# Patient Record
Sex: Female | Born: 1985 | Race: White | Hispanic: No | Marital: Married | Smoking: Never smoker
Health system: Southern US, Community
[De-identification: ages and names within clinical notes are randomized; demographics above are authoritative.]

## PROBLEM LIST (undated history)

## (undated) DIAGNOSIS — F419 Anxiety disorder, unspecified: Secondary | ICD-10-CM

## (undated) DIAGNOSIS — R569 Unspecified convulsions: Secondary | ICD-10-CM

## (undated) DIAGNOSIS — M545 Low back pain, unspecified: Secondary | ICD-10-CM

## (undated) DIAGNOSIS — G4700 Insomnia, unspecified: Secondary | ICD-10-CM

## (undated) DIAGNOSIS — R319 Hematuria, unspecified: Secondary | ICD-10-CM

## (undated) DIAGNOSIS — R21 Rash and other nonspecific skin eruption: Secondary | ICD-10-CM

## (undated) DIAGNOSIS — F410 Panic disorder [episodic paroxysmal anxiety] without agoraphobia: Secondary | ICD-10-CM

## (undated) DIAGNOSIS — Z9189 Other specified personal risk factors, not elsewhere classified: Secondary | ICD-10-CM

## (undated) HISTORY — PX: NASAL ENDOSCOPY: SHX286

## (undated) HISTORY — PX: MOUTH SURGERY: SHX715

## (undated) HISTORY — PX: WISDOM TOOTH EXTRACTION: SHX21

## (undated) HISTORY — PX: MOLE REMOVAL: SHX2046

## (undated) HISTORY — DX: Low back pain, unspecified: M54.50

## (undated) HISTORY — DX: Insomnia, unspecified: G47.00

## (undated) HISTORY — DX: Rash and other nonspecific skin eruption: R21

## (undated) HISTORY — PX: TONSILLECTOMY: SUR1361

## (undated) HISTORY — PX: SHOULDER SURGERY: SHX246

## (undated) HISTORY — PX: COLONOSCOPY: SHX174

## (undated) HISTORY — DX: Hematuria, unspecified: R31.9

## (undated) HISTORY — PX: ROTATOR CUFF REPAIR: SHX139

---
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
## 2006-07-13 ENCOUNTER — Emergency Department (HOSPITAL_COMMUNITY): Admission: EM | Admit: 2006-07-13 | Discharge: 2006-07-13 | Payer: Self-pay | Admitting: *Deleted

## 2009-05-27 ENCOUNTER — Emergency Department (HOSPITAL_BASED_OUTPATIENT_CLINIC_OR_DEPARTMENT_OTHER): Admission: EM | Admit: 2009-05-27 | Discharge: 2009-05-27 | Payer: Self-pay | Admitting: Emergency Medicine

## 2010-01-17 ENCOUNTER — Other Ambulatory Visit: Admission: RE | Admit: 2010-01-17 | Discharge: 2010-01-17 | Payer: Self-pay | Admitting: Family Medicine

## 2010-09-27 ENCOUNTER — Other Ambulatory Visit: Admission: RE | Admit: 2010-09-27 | Discharge: 2010-09-27 | Payer: Self-pay | Admitting: Obstetrics and Gynecology

## 2011-03-18 LAB — DIFFERENTIAL
Basophils Absolute: 0 10*3/uL (ref 0.0–0.1)
Basophils Relative: 0 % (ref 0–1)
Eosinophils Absolute: 0.1 10*3/uL (ref 0.0–0.7)
Eosinophils Relative: 1 % (ref 0–5)
Lymphocytes Relative: 24 % (ref 12–46)
Lymphs Abs: 2 10*3/uL (ref 0.7–4.0)
Monocytes Absolute: 0.6 10*3/uL (ref 0.1–1.0)
Monocytes Relative: 7 % (ref 3–12)
Neutro Abs: 5.7 10*3/uL (ref 1.7–7.7)
Neutrophils Relative %: 68 % (ref 43–77)

## 2011-03-18 LAB — BASIC METABOLIC PANEL
BUN: 14 mg/dL (ref 6–23)
CO2: 27 mEq/L (ref 19–32)
Calcium: 9.3 mg/dL (ref 8.4–10.5)
Chloride: 103 mEq/L (ref 96–112)
Creatinine, Ser: 0.8 mg/dL (ref 0.4–1.2)
GFR calc Af Amer: >60 mL/min (ref >60)
GFR calc non Af Amer: >60 mL/min (ref >60)
Glucose, Bld: 85 mg/dL (ref 70–99)
Potassium: 4.4 mEq/L (ref 3.5–5.1)
Sodium: 141 mEq/L (ref 135–145)

## 2011-03-18 LAB — CBC
HCT: 44.7 % (ref 36.0–46.0)
Hemoglobin: 14.2 g/dL (ref 12.0–15.0)
MCHC: 31.7 g/dL (ref 30.0–36.0)
MCV: 88.8 fL (ref 78.0–100.0)
Platelets: 221 10*3/uL (ref 150–400)
RBC: 5.04 MIL/uL (ref 3.87–5.11)
RDW: 12.3 % (ref 11.5–15.5)
WBC: 8.4 10*3/uL (ref 4.0–10.5)

## 2011-03-18 LAB — PREGNANCY, URINE: Preg Test, Ur: NEGATIVE

## 2011-04-01 ENCOUNTER — Other Ambulatory Visit (HOSPITAL_COMMUNITY)
Admission: RE | Admit: 2011-04-01 | Discharge: 2011-04-01 | Disposition: A | Discharge status: Home / Self Care | Payer: Managed Care, Other (non HMO) | Source: Ambulatory Visit | Attending: Obstetrics and Gynecology | Admitting: Obstetrics and Gynecology

## 2011-04-01 DIAGNOSIS — Z01419 Encounter for gynecological examination (general) (routine) without abnormal findings: Secondary | ICD-10-CM | POA: Insufficient documentation

## 2012-05-26 ENCOUNTER — Other Ambulatory Visit: Payer: Self-pay | Admitting: Obstetrics and Gynecology

## 2012-05-26 ENCOUNTER — Other Ambulatory Visit (HOSPITAL_COMMUNITY)
Admission: RE | Admit: 2012-05-26 | Discharge: 2012-05-26 | Disposition: A | Discharge status: Home / Self Care | Payer: Managed Care, Other (non HMO) | Source: Ambulatory Visit | Attending: Obstetrics and Gynecology | Admitting: Obstetrics and Gynecology

## 2012-05-26 DIAGNOSIS — Z113 Encounter for screening for infections with a predominantly sexual mode of transmission: Secondary | ICD-10-CM | POA: Insufficient documentation

## 2012-05-26 DIAGNOSIS — Z01419 Encounter for gynecological examination (general) (routine) without abnormal findings: Secondary | ICD-10-CM | POA: Insufficient documentation

## 2012-05-26 DIAGNOSIS — N760 Acute vaginitis: Secondary | ICD-10-CM | POA: Insufficient documentation

## 2013-06-28 ENCOUNTER — Other Ambulatory Visit (HOSPITAL_COMMUNITY)
Admission: RE | Admit: 2013-06-28 | Discharge: 2013-06-28 | Disposition: A | Discharge status: Home / Self Care | Payer: 59 | Source: Ambulatory Visit | Attending: Obstetrics and Gynecology | Admitting: Obstetrics and Gynecology

## 2013-06-28 ENCOUNTER — Other Ambulatory Visit: Payer: Self-pay | Admitting: Obstetrics and Gynecology

## 2013-06-28 DIAGNOSIS — Z01419 Encounter for gynecological examination (general) (routine) without abnormal findings: Secondary | ICD-10-CM | POA: Insufficient documentation

## 2014-07-04 ENCOUNTER — Other Ambulatory Visit (HOSPITAL_COMMUNITY)
Admission: RE | Admit: 2014-07-04 | Discharge: 2014-07-04 | Disposition: A | Discharge status: Home / Self Care | Payer: 59 | Source: Ambulatory Visit | Attending: Obstetrics and Gynecology | Admitting: Obstetrics and Gynecology

## 2014-07-04 ENCOUNTER — Other Ambulatory Visit: Payer: Self-pay | Admitting: Obstetrics and Gynecology

## 2014-07-04 DIAGNOSIS — Z01419 Encounter for gynecological examination (general) (routine) without abnormal findings: Secondary | ICD-10-CM | POA: Insufficient documentation

## 2014-07-07 LAB — CYTOLOGY - PAP

## 2014-10-10 ENCOUNTER — Emergency Department (HOSPITAL_COMMUNITY): Payer: 59

## 2014-10-10 ENCOUNTER — Emergency Department (HOSPITAL_COMMUNITY)
Admission: EM | Admit: 2014-10-10 | Discharge: 2014-10-11 | Disposition: A | Discharge status: Home / Self Care | Payer: 59 | Attending: Emergency Medicine | Admitting: Emergency Medicine

## 2014-10-10 ENCOUNTER — Encounter (HOSPITAL_COMMUNITY): Payer: Self-pay

## 2014-10-10 DIAGNOSIS — Z3202 Encounter for pregnancy test, result negative: Secondary | ICD-10-CM | POA: Insufficient documentation

## 2014-10-10 DIAGNOSIS — Z79899 Other long term (current) drug therapy: Secondary | ICD-10-CM | POA: Insufficient documentation

## 2014-10-10 DIAGNOSIS — N83209 Unspecified ovarian cyst, unspecified side: Secondary | ICD-10-CM

## 2014-10-10 DIAGNOSIS — N830 Follicular cyst of ovary: Secondary | ICD-10-CM | POA: Insufficient documentation

## 2014-10-10 DIAGNOSIS — Z7951 Long term (current) use of inhaled steroids: Secondary | ICD-10-CM | POA: Diagnosis not present

## 2014-10-10 DIAGNOSIS — R102 Pelvic and perineal pain: Secondary | ICD-10-CM | POA: Diagnosis not present

## 2014-10-10 DIAGNOSIS — R1032 Left lower quadrant pain: Secondary | ICD-10-CM | POA: Diagnosis present

## 2014-10-10 LAB — CBC WITH DIFFERENTIAL/PLATELET
Basophils Absolute: 0 10*3/uL (ref 0.0–0.1)
Basophils Relative: 0 % (ref 0–1)
Eosinophils Absolute: 0.2 10*3/uL (ref 0.0–0.7)
Eosinophils Relative: 2 % (ref 0–5)
HCT: 38.2 % (ref 36.0–46.0)
Hemoglobin: 13.1 g/dL (ref 12.0–15.0)
Lymphocytes Relative: 39 % (ref 12–46)
Lymphs Abs: 3.3 10*3/uL (ref 0.7–4.0)
MCH: 29.8 pg (ref 26.0–34.0)
MCHC: 34.3 g/dL (ref 30.0–36.0)
MCV: 87 fL (ref 78.0–100.0)
Monocytes Absolute: 0.7 10*3/uL (ref 0.1–1.0)
Monocytes Relative: 8 % (ref 3–12)
Neutro Abs: 4.4 10*3/uL (ref 1.7–7.7)
Neutrophils Relative %: 51 % (ref 43–77)
Platelets: 201 10*3/uL (ref 150–400)
RBC: 4.39 MIL/uL (ref 3.87–5.11)
RDW: 12.9 % (ref 11.5–15.5)
WBC: 8.6 10*3/uL (ref 4.0–10.5)

## 2014-10-10 LAB — URINE MICROSCOPIC-ADD ON

## 2014-10-10 LAB — COMPREHENSIVE METABOLIC PANEL
ALT: 57 U/L — ABNORMAL HIGH (ref 0–35)
AST: 35 U/L (ref 0–37)
Albumin: 3.9 g/dL (ref 3.5–5.2)
Alkaline Phosphatase: 59 U/L (ref 39–117)
Anion gap: 13 (ref 5–15)
BUN: 13 mg/dL (ref 6–23)
CO2: 26 mEq/L (ref 19–32)
Calcium: 9.5 mg/dL (ref 8.4–10.5)
Chloride: 99 mEq/L (ref 96–112)
Creatinine, Ser: 0.69 mg/dL (ref 0.50–1.10)
GFR calc Af Amer: >90 mL/min (ref >90)
GFR calc non Af Amer: >90 mL/min (ref >90)
Glucose, Bld: 94 mg/dL (ref 70–99)
Potassium: 4.3 mEq/L (ref 3.7–5.3)
Sodium: 138 mEq/L (ref 137–147)
Total Bilirubin: <0.2 mg/dL — ABNORMAL LOW (ref 0.3–1.2)
Total Protein: 8.3 g/dL (ref 6.0–8.3)

## 2014-10-10 LAB — PREGNANCY, URINE: Preg Test, Ur: NEGATIVE

## 2014-10-10 LAB — URINALYSIS, ROUTINE W REFLEX MICROSCOPIC
Bilirubin Urine: NEGATIVE
Glucose, UA: NEGATIVE mg/dL
Ketones, ur: NEGATIVE mg/dL
Nitrite: NEGATIVE
Protein, ur: NEGATIVE mg/dL
Specific Gravity, Urine: 1.018 (ref 1.005–1.030)
Urobilinogen, UA: 0.2 mg/dL (ref 0.0–1.0)
pH: 6.5 (ref 5.0–8.0)

## 2014-10-10 LAB — LIPASE, BLOOD: Lipase: 43 U/L (ref 11–59)

## 2014-10-10 MED ORDER — IOHEXOL 300 MG/ML  SOLN
50.0000 mL | Freq: Once | INTRAMUSCULAR | Status: AC | PRN
  Administered 2014-10-10: 50 mL via ORAL

## 2014-10-10 MED ORDER — IOHEXOL 300 MG/ML  SOLN
100.0000 mL | Freq: Once | INTRAMUSCULAR | Status: AC | PRN
  Administered 2014-10-10: 100 mL via INTRAVENOUS

## 2014-10-10 NOTE — ED Notes (Signed)
Pt c/o lower abdominal pain and distention x 2-3 days.  Pain score 4/10, increasing w/ movement.  Denies n/v/d.  Pt sent by PCP to r/o appendicitis and for a pelvic exam.

## 2014-10-11 ENCOUNTER — Emergency Department (HOSPITAL_COMMUNITY): Payer: 59

## 2014-10-11 MED ORDER — TRAMADOL HCL 50 MG PO TABS: 50.0000 mg | ORAL_TABLET | Freq: Four times a day (QID) | ORAL | Status: DC | PRN

## 2014-10-11 MED ORDER — MORPHINE SULFATE 4 MG/ML IJ SOLN
4.0000 mg | Freq: Once | INTRAMUSCULAR | Status: AC
  Administered 2014-10-11: 4 mg via INTRAVENOUS
  Filled 2014-10-11: qty 1

## 2014-10-11 MED ORDER — ONDANSETRON HCL 4 MG/2ML IJ SOLN
4.0000 mg | Freq: Once | INTRAMUSCULAR | Status: AC
  Administered 2014-10-11: 4 mg via INTRAVENOUS
  Filled 2014-10-11: qty 2

## 2014-10-11 NOTE — Discharge Instructions (Signed)
You have a hemorrhagic cyst. Please see your Gynecologist for evaluation of the cyst. If the bleeding is extremely heavy - come to the ER. Most of the time these cyst stop bleeding on their own.   Ovarian Cyst An ovarian cyst is a fluid-filled sac that forms on an ovary. The ovaries are small organs that produce eggs in women. Various types of cysts can form on the ovaries. Most are not cancerous. Many do not cause problems, and they often go away on their own. Some may cause symptoms and require treatment. Common types of ovarian cysts include:  Functional cysts--These cysts may occur every month during the menstrual cycle. This is normal. The cysts usually go away with the next menstrual cycle if the woman does not get pregnant. Usually, there are no symptoms with a functional cyst.  Endometrioma cysts--These cysts form from the tissue that lines the uterus. They are also called "chocolate cysts" because they become filled with blood that turns brown. This type of cyst can cause pain in the lower abdomen during intercourse and with your menstrual period.  Cystadenoma cysts--This type develops from the cells on the outside of the ovary. These cysts can get very big and cause lower abdomen pain and pain with intercourse. This type of cyst can twist on itself, cut off its blood supply, and cause severe pain. It can also easily rupture and cause a lot of pain.  Dermoid cysts--This type of cyst is sometimes found in both ovaries. These cysts may contain different kinds of body tissue, such as skin, teeth, hair, or cartilage. They usually do not cause symptoms unless they get very big.  Theca lutein cysts--These cysts occur when too much of a certain hormone (human chorionic gonadotropin) is produced and overstimulates the ovaries to produce an egg. This is most common after procedures used to assist with the conception of a baby (in vitro fertilization). CAUSES   Fertility drugs can cause a condition  in which multiple large cysts are formed on the ovaries. This is called ovarian hyperstimulation syndrome.  A condition called polycystic ovary syndrome can cause hormonal imbalances that can lead to nonfunctional ovarian cysts. SIGNS AND SYMPTOMS  Many ovarian cysts do not cause symptoms. If symptoms are present, they may include:  Pelvic pain or pressure.  Pain in the lower abdomen.  Pain during sexual intercourse.  Increasing girth (swelling) of the abdomen.  Abnormal menstrual periods.  Increasing pain with menstrual periods.  Stopping having menstrual periods without being pregnant. DIAGNOSIS  These cysts are commonly found during a routine or annual pelvic exam. Tests may be ordered to find out more about the cyst. These tests may include:  Ultrasound.  X-ray of the pelvis.  CT scan.  MRI.  Blood tests. TREATMENT  Many ovarian cysts go away on their own without treatment. Your health care provider may want to check your cyst regularly for 2-3 months to see if it changes. For women in menopause, it is particularly important to monitor a cyst closely because of the higher rate of ovarian cancer in menopausal women. When treatment is needed, it may include any of the following:  A procedure to drain the cyst (aspiration). This may be done using a long needle and ultrasound. It can also be done through a laparoscopic procedure. This involves using a thin, lighted tube with a tiny camera on the end (laparoscope) inserted through a small incision.  Surgery to remove the whole cyst. This may be done using laparoscopic  surgery or an open surgery involving a larger incision in the lower abdomen.  Hormone treatment or birth control pills. These methods are sometimes used to help dissolve a cyst. HOME CARE INSTRUCTIONS   Only take over-the-counter or prescription medicines as directed by your health care provider.  Follow up with your health care provider as directed.  Get  regular pelvic exams and Pap tests. SEEK MEDICAL CARE IF:   Your periods are late, irregular, or painful, or they stop.  Your pelvic pain or abdominal pain does not go away.  Your abdomen becomes larger or swollen.  You have pressure on your bladder or trouble emptying your bladder completely.  You have pain during sexual intercourse.  You have feelings of fullness, pressure, or discomfort in your stomach.  You lose weight for no apparent reason.  You feel generally ill.  You become constipated.  You lose your appetite.  You develop acne.  You have an increase in body and facial hair.  You are gaining weight, without changing your exercise and eating habits.  You think you are pregnant. SEEK IMMEDIATE MEDICAL CARE IF:   You have increasing abdominal pain.  You feel sick to your stomach (nauseous), and you throw up (vomit).  You develop a fever that comes on suddenly.  You have abdominal pain during a bowel movement.  Your menstrual periods become heavier than usual. MAKE SURE YOU:  Understand these instructions.  Will watch your condition.  Will get help right away if you are not doing well or get worse. Document Released: 11/25/2005 Document Revised: 11/30/2013 Document Reviewed: 08/02/2013 [facility] Patient Information 2015 [place], [place]. This information is not intended to replace advice given to you by your health care provider. Make sure you discuss any questions you have with your health care provider.

## 2014-10-11 NOTE — ED Provider Notes (Signed)
CSN: [id number]     Arrival date & time 10/10/14  1838 History   First MD Initiated Contact with Patient 10/10/14 2144     Chief Complaint  Patient presents with  . Abdominal Pain     (Consider location/radiation/quality/duration/timing/severity/associated sxs/prior Treatment) HPI Patient presents to the emergency department withlower abdominal pain for the last 2-3 days.  Patient states that pain is increased with movement and palpation.  Patient states that she was sent here by her primary care doctor to rule out appendicitis and for further evaluation.  The patient states that nothing seems to make her condition better.  Patient denies chest pain, shortness of breath, nausea, vomiting, weakness, dizziness, headache, blurred vision, back pain, neck pain, dysuria, vaginal bleeding, vaginal discharge, fever or syncope.  The patient states that her pain is mostly in the left lower quadrant History reviewed. No pertinent past medical history. Past Surgical History  Procedure Laterality Date  . Shoulder surgery Right   . Tonsillectomy    . Tonsillectomy     History reviewed. No pertinent family history. History  Substance Use Topics  . Smoking status: Never Smoker   . Smokeless tobacco: Not on file  . Alcohol Use: Yes     Comment: occ   OB History    No data available     Review of Systems All other systems negative except as documented in the HPI. All pertinent positives and negatives as reviewed in the HPI.   Allergies  Review of patient's allergies indicates no known allergies.  Home Medications   Prior to Admission medications   Medication Sig Start Date End Date Taking? Authorizing Provider  ALPRAZolam Prudy Feeler) 0.5 MG tablet Take 0.25-0.5 mg by mouth 2 (two) times daily as needed for anxiety.   Yes Historical Provider, MD  fluticasone (FLONASE) 50 MCG/ACT nasal spray Place 2 sprays into both nostrils daily.   Yes Historical Provider, MD  Misc Natural Products (GLUCOSAMINE  CHONDROITIN ADV PO) Take 2 tablets by mouth 2 (two) times daily.   Yes Historical Provider, MD  Multiple Vitamin (MULTIVITAMIN WITH MINERALS) TABS tablet Take 1 tablet by mouth daily.   Yes Historical Provider, MD  sertraline (ZOLOFT) 50 MG tablet Take 50 mg by mouth daily.   Yes Historical Provider, MD   BP 110/73 mmHg  Pulse 85  Temp(Src) 98.8 F (37.1 C) (Oral)  Resp 18  SpO2 99%  LMP 09/19/2014 (Approximate) Physical Exam  Constitutional: She is oriented to person, place, and time. She appears well-developed and well-nourished. No distress.  HENT:  Head: Normocephalic and atraumatic.  Mouth/Throat: Oropharynx is clear and moist.  Eyes: Pupils are equal, round, and reactive to light.  Neck: Normal range of motion. Neck supple.  Cardiovascular: Normal rate, regular rhythm and normal heart sounds.  Exam reveals no gallop and no friction rub.   No murmur heard. Pulmonary/Chest: Effort normal and breath sounds normal. No respiratory distress.  Abdominal: Soft. Normal appearance and bowel sounds are normal. She exhibits no distension. There is tenderness in the left lower quadrant. There is no rigidity, no rebound and no guarding.    Genitourinary: No vaginal discharge found.  Neurological: She is alert and oriented to person, place, and time. She exhibits normal muscle tone. Coordination normal.  Skin: Skin is warm and dry. No rash noted. No erythema.  Nursing note and vitals reviewed.   ED Course  Procedures (including critical care time) Labs Review Labs Reviewed  COMPREHENSIVE METABOLIC PANEL - Abnormal; Notable for the following:  ALT 57 (*)    Total Bilirubin <0.2 (*)    All other components within normal limits  URINALYSIS, ROUTINE W REFLEX MICROSCOPIC - Abnormal; Notable for the following:    APPearance CLOUDY (*)    Hgb urine dipstick MODERATE (*)    Leukocytes, UA MODERATE (*)    All other components within normal limits  URINE MICROSCOPIC-ADD ON - Abnormal;  Notable for the following:    Squamous Epithelial / LPF FEW (*)    Bacteria, UA MANY (*)    All other components within normal limits  LIPASE, BLOOD  CBC WITH DIFFERENTIAL  PREGNANCY, URINE    Imaging Review Kore[place] Transvaginal Non-ob  10/11/2014   CLINICAL DATA:  Pelvic pain  EXAM: TRANSABDOMINAL AND TRANSVAGINAL ULTRASOUND OF PELVIS  DOPPLER ULTRASOUND OF OVARIES  TECHNIQUE: Both transabdominal and transvaginal ultrasound examinations of the pelvis were performed. Transabdominal technique was performed for global imaging of the pelvis including uterus, ovaries, adnexal regions, and pelvic cul-de-sac.  It was necessary to proceed with endovaginal exam following the transabdominal exam to visualize the endometrium and ovaries. Color and duplex Doppler ultrasound was utilized to evaluate blood flow to the ovaries.  COMPARISON:  CT abdomen and pelvis 10/10/2014  FINDINGS: Uterus  Measurements: 6.3 x 5.5 x 2.9 cm, anteverted. No fibroids or other mass visualized.  Endometrium  Thickness: 10 mm.  No focal abnormality visualized.  Right ovary  Measurements: 2.6 x 1.7 x 1.6 cm. Normal appearance/no adnexal mass.  Left ovary  Measurements: 3.3 x 2.2 x 2.6 cm. Complex cystic structure with homogeneous diffuse low-level echoes demonstrated. Lesion measures 2 x 1.6 x 1.7 cm. There is prominent peripheral flow. Appearance is consistent with corpus luteum cyst.  Pulsed Doppler evaluation of both ovaries demonstrates normal low-resistance arterial and venous waveforms. Flow is demonstrated in both ovaries on color flow Doppler imaging.  Other findings  Moderate free fluid in the pelvis with internal echoes consistent with hemorrhagic fluid.  IMPRESSION: Probable hemorrhagic corpus luteum cyst in the left ovary with free fluid in the pelvis. Uterus and right ovary are unremarkable.   Electronically Signed   By: Burman NievesWilliam  Stevens M.D.   On: 10/11/2014 02:44   [place] Pelvis Complete  10/11/2014   CLINICAL DATA:  Pelvic pain   EXAM: TRANSABDOMINAL AND TRANSVAGINAL ULTRASOUND OF PELVIS  DOPPLER ULTRASOUND OF OVARIES  TECHNIQUE: Both transabdominal and transvaginal ultrasound examinations of the pelvis were performed. Transabdominal technique was performed for global imaging of the pelvis including uterus, ovaries, adnexal regions, and pelvic cul-de-sac.  It was necessary to proceed with endovaginal exam following the transabdominal exam to visualize the endometrium and ovaries. Color and duplex Doppler ultrasound was utilized to evaluate blood flow to the ovaries.  COMPARISON:  CT abdomen and pelvis 10/10/2014  FINDINGS: Uterus  Measurements: 6.3 x 5.5 x 2.9 cm, anteverted. No fibroids or other mass visualized.  Endometrium  Thickness: 10 mm.  No focal abnormality visualized.  Right ovary  Measurements: 2.6 x 1.7 x 1.6 cm. Normal appearance/no adnexal mass.  Left ovary  Measurements: 3.3 x 2.2 x 2.6 cm. Complex cystic structure with homogeneous diffuse low-level echoes demonstrated. Lesion measures 2 x 1.6 x 1.7 cm. There is prominent peripheral flow. Appearance is consistent with corpus luteum cyst.  Pulsed Doppler evaluation of both ovaries demonstrates normal low-resistance arterial and venous waveforms. Flow is demonstrated in both ovaries on color flow Doppler imaging.  Other findings  Moderate free fluid in the pelvis with internal echoes consistent with hemorrhagic fluid.  IMPRESSION: Probable hemorrhagic corpus luteum cyst in the left ovary with free fluid in the pelvis. Uterus and right ovary are unremarkable.   Electronically Signed   By: Burman NievesWilliam  Stevens M.D.   On: 10/11/2014 02:44   Ct Abdomen Pelvis W Contrast  10/11/2014   CLINICAL DATA:  Abdominal pain.  EXAM: CT ABDOMEN AND PELVIS WITH CONTRAST  TECHNIQUE: Multidetector CT imaging of the abdomen and pelvis was performed using the standard protocol following bolus administration of intravenous contrast.  CONTRAST:  50mL OMNIPAQUE IOHEXOL 300 MG/ML SOLN, 100mL OMNIPAQUE  IOHEXOL 300 MG/ML SOLN  COMPARISON:  None.  FINDINGS: The lung bases are clear.  The liver, spleen, gallbladder, pancreas, adrenal glands, kidneys, abdominal aorta, inferior vena cava, and retroperitoneal lymph nodes are unremarkable. Stomach and small bowel are decompressed. Contrast material and stool in the colon without distention or wall thickening. No free air or free fluid in the abdomen.  Pelvis: There is an involuting follicle in the left ovary with moderate amount of free fluid/hemorrhage in the pelvis and extending up the pericolic gutters. This is probably physiologic. Uterus is not enlarged. Appendix is normal. No pelvic mass or lymphadenopathy. No evidence of diverticulitis. The no destructive bone lesions.  IMPRESSION: Probable involuting follicle in the left ovary with hemorrhagic fluid in the pelvis extending up the pericolic gutters, likely due to physiologic process.   Electronically Signed   By: Burman NievesWilliam  Stevens M.D.   On: 10/11/2014 00:37   [place] Art/ven Flow Abd Pelv Doppler  10/11/2014   CLINICAL DATA:  Pelvic pain  EXAM: TRANSABDOMINAL AND TRANSVAGINAL ULTRASOUND OF PELVIS  DOPPLER ULTRASOUND OF OVARIES  TECHNIQUE: Both transabdominal and transvaginal ultrasound examinations of the pelvis were performed. Transabdominal technique was performed for global imaging of the pelvis including uterus, ovaries, adnexal regions, and pelvic cul-de-sac.  It was necessary to proceed with endovaginal exam following the transabdominal exam to visualize the endometrium and ovaries. Color and duplex Doppler ultrasound was utilized to evaluate blood flow to the ovaries.  COMPARISON:  CT abdomen and pelvis 10/10/2014  FINDINGS: Uterus  Measurements: 6.3 x 5.5 x 2.9 cm, anteverted. No fibroids or other mass visualized.  Endometrium  Thickness: 10 mm.  No focal abnormality visualized.  Right ovary  Measurements: 2.6 x 1.7 x 1.6 cm. Normal appearance/no adnexal mass.  Left ovary  Measurements: 3.3 x 2.2 x 2.6 cm.  Complex cystic structure with homogeneous diffuse low-level echoes demonstrated. Lesion measures 2 x 1.6 x 1.7 cm. There is prominent peripheral flow. Appearance is consistent with corpus luteum cyst.  Pulsed Doppler evaluation of both ovaries demonstrates normal low-resistance arterial and venous waveforms. Flow is demonstrated in both ovaries on color flow Doppler imaging.  Other findings  Moderate free fluid in the pelvis with internal echoes consistent with hemorrhagic fluid.  IMPRESSION: Probable hemorrhagic corpus luteum cyst in the left ovary with free fluid in the pelvis. Uterus and right ovary are unremarkable.   Electronically Signed   By: Burman NievesWilliam  Stevens M.D.   On: 10/11/2014 02:44      MDM   Final diagnoses:  Acute pelvic pain, female     Patient was advised that she will need follow-up withher GYN, awaiting ultrasound results. patient is advised of her results and all questions were answered.  She agrees to the plan.  Carlyle Dollyhristopher W Morayma [place], PA-C 10/11/14 [phone]

## 2014-10-11 NOTE — ED Provider Notes (Signed)
  Physical Exam  BP 110/73 mmHg  Pulse 85  Temp(Src) 98.8 F (37.1 C) (Oral)  Resp 18  SpO2 99%  LMP 09/19/2014 (Approximate)  Physical Exam  ED Course  Procedures  MDM  PT comes in with abd pain. US ordered  -to eval for a complex mass, that looks like cyst. Vitals are stable, pt has a gyne.      Derwood KaplanAnkit Ajanae Virag, MD 10/11/14 [id number]

## 2014-11-13 ENCOUNTER — Telehealth: Payer: Self-pay | Admitting: Obstetrics and Gynecology

## 2014-11-13 NOTE — Telephone Encounter (Signed)
TC from pattient of Dr. Pam Drownole--hx hemorrhagic left ovarian cyst, approx 3 cm, dx 11/3 in ER, had pain.  Seen by Dr. Richardson Doppole 1-2 weeks later, with cyst larger (approx 5 cm).  On Vicodin at home. Has been on OCPs and Nuvaring for cyst control, but has switched several times, restarted last week.  Calling today with increased pain, small amount bright red bleeding.  Taking Vicodin without benefit.  Advised patient to be seen in MAU for emergent evaluation, then will need f/u with Dr. Richardson Doppole regarding long-term management of issue.

## 2015-05-04 ENCOUNTER — Other Ambulatory Visit: Payer: Self-pay | Admitting: Orthopedic Surgery

## 2015-05-04 DIAGNOSIS — M25512 Pain in left shoulder: Secondary | ICD-10-CM

## 2015-05-04 DIAGNOSIS — M25511 Pain in right shoulder: Secondary | ICD-10-CM

## 2015-05-05 ENCOUNTER — Other Ambulatory Visit: Payer: Self-pay | Admitting: Orthopedic Surgery

## 2015-05-05 DIAGNOSIS — M25512 Pain in left shoulder: Secondary | ICD-10-CM

## 2015-05-09 ENCOUNTER — Ambulatory Visit
Admission: RE | Admit: 2015-05-09 | Discharge: 2015-05-09 | Disposition: A | Discharge status: Home / Self Care | Payer: 59 | Source: Ambulatory Visit | Attending: Orthopedic Surgery | Admitting: Orthopedic Surgery

## 2015-05-09 DIAGNOSIS — M25511 Pain in right shoulder: Secondary | ICD-10-CM | POA: Insufficient documentation

## 2015-05-09 DIAGNOSIS — Z9889 Other specified postprocedural states: Secondary | ICD-10-CM | POA: Diagnosis not present

## 2015-05-09 DIAGNOSIS — M25512 Pain in left shoulder: Secondary | ICD-10-CM | POA: Diagnosis present

## 2015-05-09 MED ORDER — GADOBENATE DIMEGLUMINE 529 MG/ML IV SOLN
0.1000 mL | Freq: Once | INTRAVENOUS | Status: AC | PRN
  Administered 2015-05-09: 0.1 mL via INTRA_ARTICULAR

## 2015-05-09 MED ORDER — IOHEXOL 300 MG/ML  SOLN
10.0000 mL | Freq: Once | INTRAMUSCULAR | Status: AC | PRN
  Administered 2015-05-09: 10 mL via INTRA_ARTERIAL

## 2015-05-09 MED ORDER — SODIUM CHLORIDE 0.9 % IV BOLUS (SEPSIS)
15.0000 mL | Freq: Once | INTRAVENOUS | Status: AC
  Administered 2015-05-09: 15 mL via INTRAVENOUS

## 2015-05-16 ENCOUNTER — Other Ambulatory Visit: Payer: Self-pay | Admitting: Orthopedic Surgery

## 2015-05-16 DIAGNOSIS — M25512 Pain in left shoulder: Secondary | ICD-10-CM

## 2015-05-18 ENCOUNTER — Ambulatory Visit: Payer: Managed Care, Other (non HMO)

## 2015-05-18 ENCOUNTER — Inpatient Hospital Stay: Admission: RE | Admit: 2015-05-18 | Payer: Managed Care, Other (non HMO) | Source: Ambulatory Visit

## 2015-06-19 ENCOUNTER — Other Ambulatory Visit: Payer: Self-pay | Admitting: Obstetrics and Gynecology

## 2015-06-19 ENCOUNTER — Other Ambulatory Visit (HOSPITAL_COMMUNITY)
Admission: RE | Admit: 2015-06-19 | Discharge: 2015-06-19 | Disposition: A | Discharge status: Home / Self Care | Payer: 59 | Source: Ambulatory Visit | Attending: Obstetrics and Gynecology | Admitting: Obstetrics and Gynecology

## 2015-06-19 DIAGNOSIS — Z01419 Encounter for gynecological examination (general) (routine) without abnormal findings: Secondary | ICD-10-CM | POA: Insufficient documentation

## 2015-06-19 DIAGNOSIS — Z1151 Encounter for screening for human papillomavirus (HPV): Secondary | ICD-10-CM | POA: Diagnosis present

## 2015-06-21 LAB — CYTOLOGY - PAP

## 2015-07-14 ENCOUNTER — Ambulatory Visit: Payer: 59 | Attending: Otolaryngology

## 2015-07-14 DIAGNOSIS — R490 Dysphonia: Secondary | ICD-10-CM

## 2015-07-14 NOTE — Therapy (Signed)
[facility] [address] [place], [place], [address] Phone: [phone]   Fax:  [phone]  Speech Language Pathology Treatment  Patient Details  Name: Julia Hansen MRN: [id number] Date of Birth: Jun 05, 1986 Referring Provider:  Flo Shanks, MD  Encounter Date: 07/14/2015      End of Session - 07/14/15 1247    Visit Number 1   Number of Visits 9   Date for SLP Re-Evaluation 09/13/15   SLP Start Time 1144   SLP Stop Time  1231   SLP Time Calculation (min) 47 min   Activity Tolerance Patient tolerated treatment well      No past medical history on file.  Past Surgical History  Procedure Laterality Date  . Shoulder surgery Right   . Tonsillectomy    . Tonsillectomy      There were no vitals filed for this visit.  Visit Diagnosis: Hoarseness, chronic      Subjective Assessment - 07/14/15 1158    Subjective "I've always had a raspy voice."   Currently in Pain? 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   Prior Functional Status   Cognitive/Linguistic Baseline Within functional limits   Cognition   Overall Cognitive Status Within Functional Limits for tasks assessed   Auditory Comprehension   Overall Auditory Comprehension Appears within functional limits for tasks assessed   Verbal Expression   Overall Verbal Expression Appears within functional limits for tasks assessed   Oral Motor/Sensory Function   Overall Oral Motor/Sensory Function Appears within functional limits for tasks assessed   Motor Speech   Phonation Hoarse   Intelligibility Intelligible   Phonation Impaired   Vocal Abuses Habitual Hyperphonia;Prolonged Vocal Use   Volume Loud     Pt is a Production designer, 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 During that time she shared she uses her voice almost constantly either on the phone or face to face communication. During her lessons she will frequently shout or yell.  She reports her voice today rated 4.5/10 where 10=her normal amount of hoarseness, as she has never known a time that she has not been hoarse. This occurred more often prior to having her tonsils out approx 10 years ago. She has green tea every morning, but water after that, is being treated for GERD but is not doing well with remembering to take her medication for it. Pt reports her mother and husband frequently tell her she is yelling and does not need to speak so loudly.   Pt tells SLP she feels vocal fatigue at night frequently following her tennis lessons, but rarely has any pain.  After evaluation tasks, SLP educated pt about a vocal hygiene program and facilitated discussion around what aspects pt wanted to focus on until next appointment. She chose curbing excessive use and excessive loudness. SLP educated pt re: a "vocal bank account" where she is trying to save as much "money" as she can for subsequent voice use. She  stated she found this analogy helpful.        SLP Education - 07/14/15 1245    Education provided Yes   Education Details voice conservation program handout, rationale for each aspect, "Checking account" for voice use, chest breather   Person(s) Educated Patient   Methods Explanation;Demonstration   Comprehension Verbalized understanding          SLP Short Term Goals - 07/14/15 1426    SLP SHORT TERM GOAL #1   Title pt will demo abdominal breathing at rest with occasional min verbal cues   Time 4   Period Weeks   Status New   SLP SHORT TERM GOAL  #2   Title pt will report following at least one aspect of vocal hygiene program over three sessions   Time 4   Period Weeks   Status New   SLP SHORT TERM GOAL #3   Title pt will rate voice 6/10 for two sessions   Time 4   Period Weeks   Status New          SLP Long Term Goals - 07/14/15 1427    SLP LONG TERM GOAL #1   Title pt will rate voice 8/10 once    Time 8   Period Weeks   Status New   SLP LONG TERM GOAL #2   Title pt will report completing at least two aspects of voice hygiene program between four sessions   Time 8   Period Weeks   Status New   SLP LONG TERM GOAL #3   Title pt will demo abdominal breathing in simple conversation 70% of the time with modified independence   Time 8   Period Weeks   Status New          Plan - 07/14/15 1247    Clinical Impression Statement Pt presents with mod hoarse voice today, rated 4.5/10 (10=regular level of hoarseness in voice).   Speech Therapy Frequency 1x /week  SLP preferred x2/week but pt unable to complete due to work schedule   Duration --  8 weeks   Treatment/Interventions Internal/external aids;Compensatory strategies;Patient/family education;SLP instruction and feedback  voice conservation program   Potential to Achieve Goals Good   Potential Considerations Severity of impairments   SLP Home Exercise Plan vocal conservation program   Consulted and Agree with Plan of Care Patient        Problem List There are no active problems to display for this patient.   [facility] , MS, CCC-SLP  07/14/2015, 2:30 PM  [facility] [address] [place], [place], [address] Phone: [phone]   Fax:  [phone]

## 2015-07-14 NOTE — Patient Instructions (Signed)
See "voice conservation program" handout

## 2015-07-18 ENCOUNTER — Encounter: Payer: Managed Care, Other (non HMO) | Admitting: Speech Pathology

## 2015-08-11 ENCOUNTER — Ambulatory Visit: Payer: 59

## 2015-08-25 ENCOUNTER — Ambulatory Visit: Payer: 59 | Attending: Otolaryngology

## 2015-08-25 DIAGNOSIS — R490 Dysphonia: Secondary | ICD-10-CM | POA: Diagnosis present

## 2015-08-25 NOTE — Therapy (Signed)
[facility] [address] [place], [place], [address] Phone: [phone]   Fax:  [phone]  Speech Language Pathology Treatment  Patient Details  Name: Julia Hansen MRN: [id number] Date of Birth: 07/09/1986 Referring Provider:  Gynecology, Jiles Prows*  Encounter Date: 08/25/2015      End of Session - 08/25/15 1248    Visit Number 2   Number of Visits 9   Date for SLP Re-Evaluation 09/13/15   SLP Start Time 1155   SLP Stop Time  1227   SLP Time Calculation (min) 32 min   Activity Tolerance Patient tolerated treatment well      No past medical history on file.  Past Surgical History  Procedure Laterality Date  . Shoulder surgery Right   . Tonsillectomy    . Tonsillectomy      There were no vitals filed for this visit.  Visit Diagnosis: Hoarseness, chronic      Subjective Assessment - 08/25/15 1200    Subjective "It's helped - my voice is not as tired at the end of the day."               ADULT SLP TREATMENT - 08/25/15 1200    General Information   Behavior/Cognition Alert;Pleasant mood;Cooperative   Treatment Provided   Treatment provided Cognitive-Linquistic   Pain Assessment   Pain Assessment No/denies pain   Cognitive-Linquistic Treatment   Treatment focused on Voice   Skilled Treatment Pt reports improved voice today, rated 7.5/10. 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 Spoke with pt about an external reminder/cue for reducing volume of speech.  SLP educated pt and trained her to breath with abdominal breathing. She req'd min A initially but was succesful with this at rest in approx 6 minutes.   Assessment / Recommendations / Plan   Plan Continue with current plan of care   Progression Toward Goals   Progression toward goals Progressing  toward goals          SLP Education - 08/25/15 1248    Education provided Yes   Education Details abdominal breathing, external cue for reducing loudness   Person(s) Educated Patient   Methods Explanation   Comprehension Verbalized understanding          SLP Short Term Goals - 08/25/15 1209    SLP SHORT TERM GOAL #1   Title pt will demo abdominal breathing at rest with occasional min verbal cues   Time --   Period --   Status Achieved   SLP SHORT TERM GOAL #2   Title pt will report following at least one aspect of vocal hygiene program over three sessions   Time 4   Period Weeks   Status On-going   SLP SHORT TERM GOAL #3   Title pt will rate voice 6/10 for two sessions   Time 4   Period Weeks   Status On-going          SLP Long Term Goals - 08/25/15 1209    SLP LONG TERM GOAL #1   Title pt will rate voice 8/10 twice   Time 8   Period Weeks   Status Revised   SLP LONG TERM GOAL #2   Title pt will report completing at least two aspects of voice hygiene program between four sessions   Time 8  Period Weeks   Status On-going   SLP LONG TERM GOAL #3   Title pt will demo abdominal breathing in simple conversation 70% of the time with modified independence   Time 8   Period Weeks   Status On-going          Plan - 08/25/15 1248    Clinical Impression Statement Pt has made gains with vocal hygiene but has more improvement to make, per pt - SLP agrees. She has learned Abdominal breathing (AB) at rest and will practice every hour for 5 minutes in order for more efficient breath support. Skilled St needs to cont to improve pt's voice quality.   Speech Therapy Frequency 1x /week   Duration --  7 weeks   Treatment/Interventions Internal/external aids;Compensatory strategies;Patient/family education;SLP instruction and feedback   Potential to Achieve Goals Good   Potential Considerations Severity of impairments        Problem List There are no active problems  to display for this patient.   [facility] , MS, CCC-SLP  08/25/2015, 12:51 PM  [facility] [address] [place], [place], [address] Phone: [phone]   Fax:  [phone]

## 2015-09-01 ENCOUNTER — Ambulatory Visit: Payer: 59

## 2015-09-01 DIAGNOSIS — R490 Dysphonia: Secondary | ICD-10-CM | POA: Diagnosis not present

## 2015-09-01 NOTE — Therapy (Signed)
[place] [address] [place], [place], [address] Phone: [phone]   Fax:  [phone]  Speech Language Pathology Treatment  Patient Details  Name: Julia Hansen MRN: [id number] Date of Birth: [birth year] Referring Provider:  Gynecology, Lynnell Jude*  Encounter Date: 09/01/2015      End of Session - 09/01/15 1245    Visit Number 3   Date for SLP Re-Evaluation 09/13/15   SLP Start Time 76   SLP Stop Time  1228   SLP Time Calculation (min) 33 min   Activity Tolerance Patient tolerated treatment well      No past medical history on file.  Past Surgical History  Procedure Laterality Date  . Shoulder surgery Right   . Tonsillectomy    . Tonsillectomy      There were no vitals filed for this visit.  Visit Diagnosis: Hoarseness, chronic             ADULT SLP TREATMENT - 09/01/15 1234    General Information   Behavior/Cognition Alert;Pleasant mood;Cooperative   Treatment Provided   Treatment provided Cognitive-Linquistic   Pain Assessment   Pain Assessment No/denies pain   Cognitive-Linquistic Treatment   Treatment focused on Voice   Skilled Treatment Pt reports voice at 9/10. She reports the external reminder to reduce volume and use abdominal breathing has been very helpful. (Voice sounds slightly improved from last week to this SLP). In 15 minutes conversation she used abdominal breathing approx 60% of the time. At this time she's pleased with her progress and thinks she has a good start to maintaing good vocal health. Discharge appropriate.   Assessment / Recommendations / Plan   Plan Discharge SLP treatment due to (comment)  pt pleased with progress   Progression Toward Goals   Progression toward goals --  met most STGs and LTGs            SLP Short Term Goals - 09/01/15 1206    SLP SHORT TERM GOAL #1   Title pt will demo abdominal breathing at rest with occasional min verbal cues    Status Achieved   SLP SHORT TERM GOAL #2   Title pt will report following at least one aspect of vocal hygiene program over three sessions   Time 4   Period Weeks   Status Achieved   SLP SHORT TERM GOAL #3   Title pt will rate voice 6/10 for two sessions   Time 4   Period Weeks   Status Achieved          SLP Long Term Goals - 09/01/15 1206    SLP LONG TERM GOAL #1   Title pt will rate voice 8/10 twice   Time 8   Period Weeks   Status Achieved   SLP LONG TERM GOAL #2   Title pt will report completing at least two aspects of voice hygiene program between four sessions   Time 8   Period Weeks   Status Achieved   SLP LONG TERM GOAL #3   Title pt will demo abdominal breathing in simple conversation 70% of the time with modified independence   Time 8   Period Weeks   Status Not Met          Plan - 09/01/15 1246    Clinical Impression Statement Pt has made gains with vocal hygiene but has more improvement to make, per pt - SLP agrees. She has learned Abdominal breathing (AB) and regulates this when reminded  from an external reminder. This external reminder also assists her in recaling to use a softer voice. Pt is pleased with progress and di/c appropriate.       SPEECH THERAPY DISCHARGE SUMMARY  Visits from Start of Care: 3  Current functional level related to goals / functional outcomes: Pt has made gains in vocal hygiene measures, and currently rates her voice 9/10 (where 10 is WNL). She is utilizing an external reminder for recall of using softer voice and for using abdominal breathing rather than chest breathing in order to make vocalization more efficient and less effortful from the larynx. Pt reports her voice is not fatigued in the evenings like prior to [place] believes pt remains motivated for maintaing positive changes in voice quality that she made in [place].  She is pleased with current progress and discharge is appropriate at this time. See "goals" above for  update on pt's meeting of targeted goals for ST.   Remaining deficits: Mild dysphonia.   Education / Equipment: Vocal conservation/hygiene, abdominal breathing, the use of external reminders for producing and maintaining positive changes to vocal quality  Plan: Patient agrees to discharge.  Patient goals were partially met. Patient is being discharged due to being pleased with the current functional level.  ?????        Problem List There are no active problems to display for this patient.   [facility] , [place], [place]  09/01/2015, 12:48 PM  [place] [address] [place], [place], [address] Phone: [phone]   Fax:  [phone]

## 2015-09-01 NOTE — Patient Instructions (Signed)
You must cont to wear your "reminder" until talking softer and using abdominal breathing becomes your habit, maybe about 4-6 more weeks. Change your reminder to a different bracelet when you don't notice it anymore.

## 2015-12-10 NOTE — L&D Delivery Note (Signed)
Delivery Note At 5:36 AM a healthy female was delivered via Vaginal, Spontaneous Delivery (Presentation: Left Occiput Anterior).  APGAR: 8, 9; weight pending.   Placenta status: complete.  Cord: 3 vessels with the following complications: None.  Cord pH: N/A  Anesthesia: Epidural  Episiotomy: None Lacerations: Second degree perineal  Suture Repair: 2.0 3.0 vicryl rapide Est. Blood Loss (mL):  300  Mom to postpartum.  Baby to Couplet care / Skin to Skin. Circumcision informed consent obtained.  Ghali Morissette,MARIE-LYNE 06/23/2016, 6:40 AM

## 2015-12-14 LAB — OB RESULTS CONSOLE RUBELLA ANTIBODY, IGM: Rubella: IMMUNE

## 2015-12-14 LAB — OB RESULTS CONSOLE HEPATITIS B SURFACE ANTIGEN: Hepatitis B Surface Ag: NEGATIVE

## 2015-12-14 LAB — OB RESULTS CONSOLE GC/CHLAMYDIA
Chlamydia: NEGATIVE
Gonorrhea: NEGATIVE

## 2015-12-14 LAB — OB RESULTS CONSOLE ANTIBODY SCREEN: Antibody Screen: NEGATIVE

## 2015-12-14 LAB — OB RESULTS CONSOLE RPR: RPR: NONREACTIVE

## 2015-12-14 LAB — OB RESULTS CONSOLE HIV ANTIBODY (ROUTINE TESTING): HIV: NONREACTIVE

## 2016-01-22 LAB — OB RESULTS CONSOLE ABO/RH: RH Type: NEGATIVE

## 2016-06-10 LAB — OB RESULTS CONSOLE GBS: GBS: POSITIVE

## 2016-06-21 ENCOUNTER — Encounter (HOSPITAL_COMMUNITY): Payer: Self-pay

## 2016-06-21 ENCOUNTER — Inpatient Hospital Stay (HOSPITAL_COMMUNITY)
Admission: AD | Admit: 2016-06-21 | Discharge: 2016-06-25 | DRG: 775 | Disposition: A | Discharge status: Home / Self Care | Payer: 59 | Source: Ambulatory Visit | Attending: Obstetrics & Gynecology | Admitting: Obstetrics & Gynecology

## 2016-06-21 DIAGNOSIS — Z3A36 36 weeks gestation of pregnancy: Secondary | ICD-10-CM

## 2016-06-21 DIAGNOSIS — O26893 Other specified pregnancy related conditions, third trimester: Secondary | ICD-10-CM | POA: Diagnosis present

## 2016-06-21 DIAGNOSIS — Z8249 Family history of ischemic heart disease and other diseases of the circulatory system: Secondary | ICD-10-CM

## 2016-06-21 DIAGNOSIS — F410 Panic disorder [episodic paroxysmal anxiety] without agoraphobia: Secondary | ICD-10-CM | POA: Diagnosis present

## 2016-06-21 DIAGNOSIS — O9962 Diseases of the digestive system complicating childbirth: Secondary | ICD-10-CM | POA: Diagnosis present

## 2016-06-21 DIAGNOSIS — O9912 Other diseases of the blood and blood-forming organs and certain disorders involving the immune mechanism complicating childbirth: Secondary | ICD-10-CM | POA: Diagnosis present

## 2016-06-21 DIAGNOSIS — K219 Gastro-esophageal reflux disease without esophagitis: Secondary | ICD-10-CM | POA: Diagnosis present

## 2016-06-21 DIAGNOSIS — O3663X0 Maternal care for excessive fetal growth, third trimester, not applicable or unspecified: Secondary | ICD-10-CM | POA: Diagnosis present

## 2016-06-21 DIAGNOSIS — O99344 Other mental disorders complicating childbirth: Secondary | ICD-10-CM | POA: Diagnosis present

## 2016-06-21 DIAGNOSIS — Z6791 Unspecified blood type, Rh negative: Secondary | ICD-10-CM

## 2016-06-21 DIAGNOSIS — O42919 Preterm premature rupture of membranes, unspecified as to length of time between rupture and onset of labor, unspecified trimester: Secondary | ICD-10-CM | POA: Diagnosis present

## 2016-06-21 DIAGNOSIS — O99824 Streptococcus B carrier state complicating childbirth: Secondary | ICD-10-CM | POA: Diagnosis present

## 2016-06-21 DIAGNOSIS — O42013 Preterm premature rupture of membranes, onset of labor within 24 hours of rupture, third trimester: Secondary | ICD-10-CM | POA: Diagnosis present

## 2016-06-21 DIAGNOSIS — O403XX0 Polyhydramnios, third trimester, not applicable or unspecified: Secondary | ICD-10-CM | POA: Diagnosis present

## 2016-06-21 DIAGNOSIS — D6959 Other secondary thrombocytopenia: Secondary | ICD-10-CM | POA: Diagnosis present

## 2016-06-21 HISTORY — DX: Panic disorder (episodic paroxysmal anxiety): F41.0

## 2016-06-21 HISTORY — DX: Unspecified convulsions: R56.9

## 2016-06-21 HISTORY — DX: Anxiety disorder, unspecified: F41.9

## 2016-06-21 HISTORY — DX: Other specified personal risk factors, not elsewhere classified: Z91.89

## 2016-06-21 LAB — CBC
HCT: 36 % (ref 36.0–46.0)
Hemoglobin: 12.3 g/dL (ref 12.0–15.0)
MCH: 30.2 pg (ref 26.0–34.0)
MCHC: 34.2 g/dL (ref 30.0–36.0)
MCV: 88.5 fL (ref 78.0–100.0)
Platelets: 133 10*3/uL — ABNORMAL LOW (ref 150–400)
RBC: 4.07 MIL/uL (ref 3.87–5.11)
RDW: 14.5 % (ref 11.5–15.5)
WBC: 10.3 10*3/uL (ref 4.0–10.5)

## 2016-06-21 LAB — POCT FERN TEST: POCT Fern Test: POSITIVE

## 2016-06-21 MED ORDER — LACTATED RINGERS IV SOLN
INTRAVENOUS | Status: DC
  Administered 2016-06-21 (×2): via INTRAVENOUS

## 2016-06-21 MED ORDER — PENICILLIN G POTASSIUM 5000000 UNITS IJ SOLR
5.0000 10*6.[IU] | Freq: Once | INTRAMUSCULAR | Status: AC
  Administered 2016-06-21: 5 10*6.[IU] via INTRAVENOUS
  Filled 2016-06-21: qty 5

## 2016-06-21 MED ORDER — TERBUTALINE SULFATE 1 MG/ML IJ SOLN
0.2500 mg | Freq: Once | INTRAMUSCULAR | Status: DC | PRN
  Filled 2016-06-21: qty 1

## 2016-06-21 MED ORDER — PENICILLIN G POTASSIUM 5000000 UNITS IJ SOLR
2.5000 10*6.[IU] | INTRAVENOUS | Status: DC
  Administered 2016-06-22 (×6): 2.5 10*6.[IU] via INTRAVENOUS
  Filled 2016-06-21 (×10): qty 2.5

## 2016-06-21 MED ORDER — LACTATED RINGERS IV SOLN: 500.0000 mL | INTRAVENOUS | Status: DC | PRN

## 2016-06-21 MED ORDER — ZOLPIDEM TARTRATE 5 MG PO TABS
5.0000 mg | ORAL_TABLET | Freq: Every evening | ORAL | Status: DC | PRN
  Administered 2016-06-22: 5 mg via ORAL
  Filled 2016-06-21: qty 1

## 2016-06-21 MED ORDER — SOD CITRATE-CITRIC ACID 500-334 MG/5ML PO SOLN: 30.0000 mL | ORAL | Status: DC | PRN

## 2016-06-21 MED ORDER — OXYTOCIN 40 UNITS IN LACTATED RINGERS INFUSION - SIMPLE MED: 2.5000 [IU]/h | INTRAVENOUS | Status: DC

## 2016-06-21 MED ORDER — OXYTOCIN 40 UNITS IN LACTATED RINGERS INFUSION - SIMPLE MED
1.0000 m[IU]/min | INTRAVENOUS | Status: DC
  Filled 2016-06-21 (×2): qty 1000

## 2016-06-21 MED ORDER — OXYTOCIN BOLUS FROM INFUSION
500.0000 mL | INTRAVENOUS | Status: DC
  Administered 2016-06-23: 500 mL via INTRAVENOUS

## 2016-06-21 MED ORDER — ONDANSETRON HCL 4 MG/2ML IJ SOLN
4.0000 mg | Freq: Four times a day (QID) | INTRAMUSCULAR | Status: DC | PRN
  Administered 2016-06-22 (×2): 4 mg via INTRAVENOUS
  Filled 2016-06-21 (×2): qty 2

## 2016-06-21 MED ORDER — ACETAMINOPHEN 325 MG PO TABS: 650.0000 mg | ORAL_TABLET | ORAL | Status: DC | PRN

## 2016-06-21 MED ORDER — LIDOCAINE HCL (PF) 1 % IJ SOLN
30.0000 mL | INTRAMUSCULAR | Status: AC | PRN
  Administered 2016-06-23 (×2): 30 mL via SUBCUTANEOUS
  Filled 2016-06-21 (×2): qty 30

## 2016-06-21 MED ORDER — OXYTOCIN 40 UNITS IN LACTATED RINGERS INFUSION - SIMPLE MED
1.0000 m[IU]/min | INTRAVENOUS | Status: DC
  Administered 2016-06-21: 2 m[IU]/min via INTRAVENOUS
  Administered 2016-06-22: 14 m[IU]/min via INTRAVENOUS

## 2016-06-21 NOTE — H&P (Signed)
Julia Hansen is a 30 y.o. female G1 at 36.6 wks, presenting with rupture membranes since 6.30 pm today, clear fluid. Decr FMs since, mild cramps, no bleeding.  PNCare- Dr Seymour BarsLavoie.  LGA with polyhydramnios at 35 wks. EFW 7'5" at 96% and AC 99%, AFI 25 cm,  Gestational thrombocytopenia Rh negative  Severe anxiety and panic attacks, requesting Xanax; reviewed other options in labor incl Fentanyl or low dose Nubain and Nitrous oxide and then Epidural in active labor to allow ambulation in early labor, and is ok with that.   History OB History    Gravida Para Term Preterm AB TAB SAB Ectopic Multiple Living   1              Past Medical History  Diagnosis Date  . Seizures (HCC)   . Anxiety   . Panic attacks   . History of fainting    Past Surgical History  Procedure Laterality Date  . Shoulder surgery Right   . Tonsillectomy    . Tonsillectomy    . Wisdom tooth extraction     Family History: family history includes Hypertension in her mother. Social History:  reports that she has never smoked. She does not have any smokeless tobacco history on file. She reports that she drinks alcohol. She reports that she does not use illicit drugs.   Prenatal Transfer Tool  Maternal Diabetes: No Genetic Screening: Normal QUAD screen engative Maternal Ultrasounds/Referrals: Normal Fetal Ultrasounds or other Referrals:  None Maternal Substance Abuse:  No Significant Maternal Medications:  None Significant Maternal Lab Results:  Lab values include: Group B Strep positive, Rh negative Other Comments:  severe anxiety   ROS Anxiety  Dilation: 1 Effacement (%): 70 Station: -2 Exam by:: benji stanley RN Blood pressure 112/77, pulse 104, temperature 99.1 F (37.3 C), temperature source Oral, resp. rate 20, height 5\' 2"  (1.575 m), weight 176 lb (79.833 kg), SpO2 98 %. Exam Physical Exam   A&O x 3, no acute distress. Pleasant HEENT neg Lungs CTA bilat CV RRR, S1S2 normal Abdo soft, non  tender, non acute Extr no edema/ tenderness Pelvic deferred since RN checked FHT 150/ + accels/ no decels/ mod variab- cat I Toco  None recorded  Prenatal labs: ABO, Rh: --/--/A NEG (07/14 2100) Antibody: POS (07/14 2100) Rubella: Immune (01/05 0000) RPR: Nonreactive (01/05 0000)  HBsAg: Negative (01/05 0000)  HIV: Non-reactive (01/05 0000)  GBS: Positive (07/03 0000)   Assessment/Plan: 30 yo G1 at 36.6 wks with ROM since 6.30 pm. Recommend pitocin, will go at slow pace due to her anxiety for pain and medication options reviewed. GBS(+) Needs PCN per protocol. FHT- cat I Short stature, borderline pelvis and expecting LGA at 7.1/2- 8 lbs, increase C/s risk but working towards vaginal birth Thrombocytopenia, CBC pending Rh neg  Will inform Dr Seymour BarsLavoie when ready for delivery to attend per pt request, she agrees, pt happy.   Elonna Mcfarlane R 06/21/2016, 11:10 PM

## 2016-06-21 NOTE — MAU Note (Signed)
Water broke at 6:24 pm, clear fluid, then tightnings on low abd began. Baby not moving as much today, last night really well.  No bleeding.

## 2016-06-22 ENCOUNTER — Inpatient Hospital Stay (HOSPITAL_COMMUNITY): Payer: 59 | Admitting: Anesthesiology

## 2016-06-22 LAB — CBC
HCT: 35.3 % — ABNORMAL LOW (ref 36.0–46.0)
Hemoglobin: 11.9 g/dL — ABNORMAL LOW (ref 12.0–15.0)
MCH: 30.1 pg (ref 26.0–34.0)
MCHC: 33.7 g/dL (ref 30.0–36.0)
MCV: 89.1 fL (ref 78.0–100.0)
Platelets: 108 10*3/uL — ABNORMAL LOW (ref 150–400)
RBC: 3.96 MIL/uL (ref 3.87–5.11)
RDW: 14.7 % (ref 11.5–15.5)
WBC: 9.2 10*3/uL (ref 4.0–10.5)

## 2016-06-22 LAB — SYPHILIS: RPR W/REFLEX TO RPR TITER AND TREPONEMAL ANTIBODIES, TRADITIONAL SCREENING AND DIAGNOSIS ALGORITHM: RPR Ser Ql: NONREACTIVE

## 2016-06-22 MED ORDER — LACTATED RINGERS IV SOLN: 500.0000 mL | Freq: Once | INTRAVENOUS | Status: DC

## 2016-06-22 MED ORDER — EPHEDRINE 5 MG/ML INJ: 10.0000 mg | INTRAVENOUS | Status: DC | PRN

## 2016-06-22 MED ORDER — PHENYLEPHRINE 40 MCG/ML (10ML) SYRINGE FOR IV PUSH (FOR BLOOD PRESSURE SUPPORT): 80.0000 ug | PREFILLED_SYRINGE | INTRAVENOUS | Status: DC | PRN

## 2016-06-22 MED ORDER — LIDOCAINE HCL (PF) 1 % IJ SOLN
INTRAMUSCULAR | Status: DC | PRN
  Administered 2016-06-22 (×2): 6 mL via EPIDURAL

## 2016-06-22 MED ORDER — EPHEDRINE 5 MG/ML INJ
10.0000 mg | INTRAVENOUS | Status: DC | PRN
  Filled 2016-06-22: qty 2

## 2016-06-22 MED ORDER — FENTANYL 2.5 MCG/ML BUPIVACAINE 1/10 % EPIDURAL INFUSION (WH - ANES)
14.0000 mL/h | INTRAMUSCULAR | Status: DC | PRN
  Administered 2016-06-23: 14 mL/h via EPIDURAL
  Filled 2016-06-22 (×2): qty 125

## 2016-06-22 MED ORDER — PHENYLEPHRINE 40 MCG/ML (10ML) SYRINGE FOR IV PUSH (FOR BLOOD PRESSURE SUPPORT)
80.0000 ug | PREFILLED_SYRINGE | INTRAVENOUS | Status: DC | PRN
  Filled 2016-06-22: qty 5

## 2016-06-22 MED ORDER — FENTANYL CITRATE (PF) 100 MCG/2ML IJ SOLN
INTRAMUSCULAR | Status: AC
  Filled 2016-06-22: qty 2

## 2016-06-22 MED ORDER — FENTANYL 2.5 MCG/ML BUPIVACAINE 1/10 % EPIDURAL INFUSION (WH - ANES)
INTRAMUSCULAR | Status: AC
  Administered 2016-06-22: 12 mL/h via EPIDURAL
  Filled 2016-06-22: qty 125

## 2016-06-22 MED ORDER — DIPHENHYDRAMINE HCL 50 MG/ML IJ SOLN: 12.5000 mg | INTRAMUSCULAR | Status: DC | PRN

## 2016-06-22 MED ORDER — FENTANYL CITRATE (PF) 100 MCG/2ML IJ SOLN
100.0000 ug | INTRAMUSCULAR | Status: DC | PRN
  Administered 2016-06-22 (×3): 100 ug via INTRAVENOUS
  Filled 2016-06-22 (×2): qty 2

## 2016-06-22 MED ORDER — PHENYLEPHRINE 40 MCG/ML (10ML) SYRINGE FOR IV PUSH (FOR BLOOD PRESSURE SUPPORT)
PREFILLED_SYRINGE | INTRAVENOUS | Status: AC
  Administered 2016-06-22: 200 ug
  Filled 2016-06-22: qty 10

## 2016-06-22 NOTE — Progress Notes (Signed)
Julia BandyMonica W Maurene Hansen is a 30 y.o. G1P0 at 131w0d. SROM 6.30 pm yesterday, no evidence of infection. GBS(+), getting PCN since admission. Awaiting Epidural  Objective: BP 102/69 mmHg  Pulse 105  Temp(Src) 98 F (36.7 C) (Oral)  Resp 18  Ht 5\' 2"  (1.575 m)  Wt 176 lb (79.833 kg)  BMI 32.18 kg/m2  SpO2 100%   FHT:  FHR: 125 bpm, variability: moderate,  accelerations:  Present,  decelerations:  Absent UC:   regular, every 2-5 minutes with coupling SVE:  3/100%/-2   Assessment / Plan: SROM 37 wks, SROM 22 hrs. No chorioamnionitis, continue labor  Gest.thrombocytopenia, will get epidural soon Suspect LGA, EFW 8 lbs  Protracted latent phase, expect pt to enter active phase now. continue pitocin.   Fetal Wellbeing:  Category I Pain Control:  Epidural requested I/D:  GBS (+), getting PCN per protocol Anticipated MOD:  Guarded, increased C/s risk due to LGA, but working towards vaginal birth.  Chisa Kushner R 06/22/2016

## 2016-06-22 NOTE — Anesthesia Pain Management Evaluation Note (Signed)
  CRNA Pain Management Visit Note  Patient: Julia Hansen, 30 y.o., female  "Hello I am a member of the anesthesia team at [facility]. We have an anesthesia team available at all times to provide care throughout the hospital, including epidural management and anesthesia for C-section. I don't know your plan for the delivery whether it a natural birth, water birth, IV sedation, nitrous supplementation, doula or epidural, but we want to meet your pain goals."   1.Was your pain managed to your expectations on prior hospitalizations?   Yes   2.What is your expectation for pain management during this hospitalization?     Epidural  3.How can we help you reach that goal? epidural  Record the patient's initial score and the patient's pain goal.   Pain: 2  Pain Goal: 4 [facility] wants you to be able to say your pain was always managed very well.  Ben Sanz 06/22/2016

## 2016-06-22 NOTE — Anesthesia Procedure Notes (Signed)
Epidural Patient location during procedure: OB Start time: 06/22/2016 5:30 PM End time: 06/22/2016 6:04 PM  Staffing Anesthesiologist: Jairo BenJACKSON, Milano Rosevear Performed by: anesthesiologist   Preanesthetic Checklist Completed: patient identified, surgical consent, pre-op evaluation, timeout performed, IV checked, risks and benefits discussed and monitors and equipment checked  Epidural Patient position: sitting Prep: site prepped and draped and DuraPrep Patient monitoring: blood pressure, continuous pulse ox and heart rate Approach: midline Location: L2-L3 Injection technique: LOR air  Needle:  Needle type: Tuohy  Needle gauge: 17 G Needle length: 9 cm Needle insertion depth: 4 cm Catheter type: closed end flexible Catheter size: 19 Gauge Catheter at skin depth: 9.5 cm Test dose: negative (1% lidocaine)  Additional Notes LOR 4 cm Cath 9.5 cm skin Pt identified in Labor room.  Monitors applied. Working IV access confirmed. Sterile prep, drape lumbar spine.  1% lido local L 2,3.  #17ga Touhy LOR air at 4 cm L 2,3, cath in easily to 9.5 cm skin. Test dose OK, cath dosed and infusion begun.  Patient asymptomatic, VSS, no heme aspirated, tolerated well.  Sandford Craze Ozzie Knobel, MD Reason for block:procedure for pain

## 2016-06-22 NOTE — Anesthesia Preprocedure Evaluation (Addendum)
Anesthesia Evaluation  Patient identified by MRN, date of birth, ID band Patient awake    Reviewed: Allergy & Precautions, NPO status , Patient's Chart, lab work & pertinent test results  History of Anesthesia Complications Negative for: history of anesthetic complications  Airway Mallampati: II  TM Distance: >3 FB Neck ROM: Full    Dental  (+) Dental Advisory Given, Teeth Intact   Pulmonary neg pulmonary ROS,    breath sounds clear to auscultation       Cardiovascular negative cardio ROS   Rhythm:Regular Rate:Normal     Neuro/Psych Seizures - (anxiety related, no meds),  Anxiety    GI/Hepatic Neg liver ROS, GERD  Poorly Controlled and Medicated,  Endo/Other  negative endocrine ROS  Renal/GU negative Renal ROS     Musculoskeletal   Abdominal   Peds  Hematology plt 108K, Hb 11.9   Anesthesia Other Findings   Reproductive/Obstetrics (+) Pregnancy                            Anesthesia Physical Anesthesia Plan  ASA: II  Anesthesia Plan: Epidural   Post-op Pain Management:    Induction:   Airway Management Planned: Natural Airway  Additional Equipment:   Intra-op Plan:   Post-operative Plan:   Informed Consent: I have reviewed the patients History and Physical, chart, labs and discussed the procedure including the risks, benefits and alternatives for the proposed anesthesia with the patient or authorized representative who has indicated his/her understanding and acceptance.   Dental advisory given  Plan Discussed with:   Anesthesia Plan Comments: (Patient identified. Risks/Benefits/Options discussed with patient including but not limited to bleeding, infection, nerve damage, paralysis, failed block, incomplete pain control, headache, blood pressure changes, nausea, vomiting, reactions to medication both or allergic, itching and postpartum back pain. Confirmed with bedside nurse  the patient's most recent platelet count. Confirmed with patient that they are not currently taking any anticoagulation, have any bleeding history or any family history of bleeding disorders. Patient expressed understanding and wished to proceed. All questions were answered.  )        Anesthesia Quick Evaluation

## 2016-06-22 NOTE — Progress Notes (Addendum)
Patient ID: Julia Hansen, female   DOB: 03/02/1986, 30 y.o.   MRN: [id number] Did well overnight with minimal cramping. Took Ambien.  BP 106/74 mmHg  Pulse 105  Temp(Src) 98.4 F (36.9 C) (Oral)  Resp 20  Ht 5\' 2"  (1.575 m)  Wt 176 lb (79.833 kg)  BMI 32.18 kg/m2  SpO2 98% FHT - category I. Cx- unchanged, 1/70%/-2 per RN.  Toco - irreg, q 4-8 min as RN unable to increase pitocin due to staffing problem, now at 10.  D/w AM RN, increase pitocin per protocol, ambulate pt to allow head engagement.

## 2016-06-22 NOTE — Progress Notes (Signed)
Julia BandyMonica W Maurene Hansen is a 30 y.o. G1P0 at 3675w0d. SROM 6.30 pm yesterday, no evidence of infection. GBS(+), getting PCN since admission.  Slow increase in pitocin overnight but now at 20 mu and having pain, sp Fentanyl, wants epidural due to anxiety coming back from pain. Considering she has severe anxiety, she has done very well since admission, got some sleep and has great support.   Objective: BP 93/54 mmHg  Pulse 101  Temp(Src) 98.3 F (36.8 C) (Oral)  Resp 20  Ht 5\' 2"  (1.575 m)  Wt 176 lb (79.833 kg)  BMI 32.18 kg/m2  SpO2 98%   FHT:  FHR: 125 bpm, variability: moderate,  accelerations:  Present,  decelerations:  Absent UC:   regular, every 2-5 minutes with coupling SVE:   Dilation: 2 Effacement (%): 70 Station: -2 Exam by:: Wandra MannanWanda Osborne RN Not much change since admission, latent labor.   Assessment / Plan: SROM 37 wks, SROM 22 hrs. No chorioamnionitis, continue labor  Gest.thrombocytopenia, stat CBC for epidural Suspect LGA, EFW 8 lbs  Protracted latent phase, continue pitocin. D/w cervical foley and IUPC after epidural, she agrees now.   Fetal Wellbeing:  Category I Pain Control:  Epidural requested, RN advised to sent stat CBC I/D:  GBS (+), getting PCN per protocol Anticipated MOD:  Guarded, increased C/s risk due to LGA, but working towards vaginal birth.  Chrles Selley R 06/22/2016, 4:23 PM

## 2016-06-23 ENCOUNTER — Encounter (HOSPITAL_COMMUNITY): Payer: Self-pay | Admitting: *Deleted

## 2016-06-23 LAB — CBC
HCT: 33.1 % — ABNORMAL LOW (ref 36.0–46.0)
Hemoglobin: 11 g/dL — ABNORMAL LOW (ref 12.0–15.0)
MCH: 29.9 pg (ref 26.0–34.0)
MCHC: 33.2 g/dL (ref 30.0–36.0)
MCV: 89.9 fL (ref 78.0–100.0)
Platelets: 140 10*3/uL — ABNORMAL LOW (ref 150–400)
RBC: 3.68 MIL/uL — ABNORMAL LOW (ref 3.87–5.11)
RDW: 14.9 % (ref 11.5–15.5)
WBC: 18 10*3/uL — ABNORMAL HIGH (ref 4.0–10.5)

## 2016-06-23 MED ORDER — OXYTOCIN 10 UNIT/ML IJ SOLN
10.0000 [IU] | Freq: Once | INTRAMUSCULAR | Status: AC
  Administered 2016-06-23: 10 [IU] via INTRAMUSCULAR

## 2016-06-23 MED ORDER — DIBUCAINE 1 % RE OINT
1.0000 "application " | TOPICAL_OINTMENT | RECTAL | Status: DC | PRN
  Administered 2016-06-24 (×2): 1 via RECTAL
  Filled 2016-06-23 (×2): qty 28

## 2016-06-23 MED ORDER — ONDANSETRON HCL 4 MG/2ML IJ SOLN: 4.0000 mg | INTRAMUSCULAR | Status: DC | PRN

## 2016-06-23 MED ORDER — OXYCODONE-ACETAMINOPHEN 5-325 MG PO TABS: 1.0000 | ORAL_TABLET | ORAL | Status: DC | PRN

## 2016-06-23 MED ORDER — WITCH HAZEL-GLYCERIN EX PADS
1.0000 "application " | MEDICATED_PAD | CUTANEOUS | Status: DC | PRN
  Administered 2016-06-24 (×2): 1 via TOPICAL

## 2016-06-23 MED ORDER — ZOLPIDEM TARTRATE 5 MG PO TABS: 5.0000 mg | ORAL_TABLET | Freq: Every evening | ORAL | Status: DC | PRN

## 2016-06-23 MED ORDER — OXYTOCIN 10 UNIT/ML IJ SOLN
INTRAMUSCULAR | Status: AC
  Administered 2016-06-23: 10 [IU] via INTRAMUSCULAR
  Filled 2016-06-23: qty 1

## 2016-06-23 MED ORDER — ACETAMINOPHEN 325 MG PO TABS
650.0000 mg | ORAL_TABLET | ORAL | Status: DC | PRN
  Administered 2016-06-23 – 2016-06-25 (×4): 650 mg via ORAL
  Filled 2016-06-23 (×4): qty 2

## 2016-06-23 MED ORDER — ONDANSETRON HCL 4 MG PO TABS: 4.0000 mg | ORAL_TABLET | ORAL | Status: DC | PRN

## 2016-06-23 MED ORDER — TETANUS-DIPHTH-ACELL PERTUSSIS 5-2.5-18.5 LF-MCG/0.5 IM SUSP: 0.5000 mL | Freq: Once | INTRAMUSCULAR | Status: DC

## 2016-06-23 MED ORDER — OXYTOCIN 40 UNITS IN LACTATED RINGERS INFUSION - SIMPLE MED: 2.5000 [IU]/h | INTRAVENOUS | Status: DC | PRN

## 2016-06-23 MED ORDER — SIMETHICONE 80 MG PO CHEW: 80.0000 mg | CHEWABLE_TABLET | ORAL | Status: DC | PRN

## 2016-06-23 MED ORDER — BENZOCAINE-MENTHOL 20-0.5 % EX AERO
1.0000 "application " | INHALATION_SPRAY | CUTANEOUS | Status: DC | PRN
  Administered 2016-06-23 (×2): 1 via TOPICAL
  Filled 2016-06-23 (×2): qty 56

## 2016-06-23 MED ORDER — COCONUT OIL OIL
1.0000 "application " | TOPICAL_OIL | Status: DC | PRN
  Administered 2016-06-25: 1 via TOPICAL
  Filled 2016-06-23: qty 120

## 2016-06-23 MED ORDER — PRENATAL MULTIVITAMIN CH
1.0000 | ORAL_TABLET | Freq: Every day | ORAL | Status: DC
  Administered 2016-06-23 – 2016-06-24 (×2): 1 via ORAL
  Filled 2016-06-23 (×2): qty 1

## 2016-06-23 MED ORDER — IBUPROFEN 600 MG PO TABS
600.0000 mg | ORAL_TABLET | Freq: Four times a day (QID) | ORAL | Status: DC
  Administered 2016-06-23 – 2016-06-24 (×4): 600 mg via ORAL
  Filled 2016-06-23 (×4): qty 1

## 2016-06-23 MED ORDER — DIPHENHYDRAMINE HCL 25 MG PO CAPS: 25.0000 mg | ORAL_CAPSULE | Freq: Four times a day (QID) | ORAL | Status: DC | PRN

## 2016-06-23 MED ORDER — HYDROCODONE-ACETAMINOPHEN 5-325 MG PO TABS
1.0000 | ORAL_TABLET | ORAL | Status: AC | PRN
  Administered 2016-06-23 (×2): 1 via ORAL
  Filled 2016-06-23 (×2): qty 1

## 2016-06-23 MED ORDER — SENNOSIDES-DOCUSATE SODIUM 8.6-50 MG PO TABS
2.0000 | ORAL_TABLET | ORAL | Status: DC
  Administered 2016-06-23 – 2016-06-24 (×2): 2 via ORAL
  Filled 2016-06-23 (×2): qty 2

## 2016-06-23 NOTE — Anesthesia Postprocedure Evaluation (Signed)
Anesthesia Post Note  Patient: Julia Hansen  Procedure(s) Performed: * No procedures listed *  Patient location during evaluation: Mother Baby Anesthesia Type: Epidural Level of consciousness: awake and alert and oriented Pain management: satisfactory to patient Vital Signs Assessment: post-procedure vital signs reviewed and stable Respiratory status: spontaneous breathing and nonlabored ventilation Cardiovascular status: stable Postop Assessment: no headache, no backache, no signs of nausea or vomiting, adequate PO intake and patient able to bend at knees (patient up walking) Anesthetic complications: no Comments: Patient pain score 7, spoke with nurse and nurse called OB, orders for percocet received.     Last Vitals:  Filed Vitals:   06/23/16 0915 06/23/16 1030  BP: 115/67 112/75  Pulse: 107 108  Temp: 37.3 C 37.2 C  Resp: 18 16    Last Pain:  Filed Vitals:   06/23/16 1145  PainSc: 5    Pain Goal: Patients Stated Pain Goal: 3 (06/22/16 1420)               Madison HickmanGREGORY,Lular Letson
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
## 2016-06-23 NOTE — Lactation Note (Signed)
This note was copied from a baby's chart. Lactation Consultation Note  Patient Name: Julia Hansen ZOXWR'UToday's Date: 06/23/2016 Reason for consult: Initial assessment Baby at 8 hr of life and mom requested help with latch. Visual assessment only, baby opens mouth wide and good tongue movement. Baby got the hiccups will feeding so he was on/off a lot. At rest mom has very short nipples with an inverted center. After some milk stimulation the entire nipple face becomes erect. Parents reports baby has not shown hunger cues and has been spitting up "a lot". Discussed baby behavior, feeding cues, pumping, feeding frequency, baby belly size, voids, wt loss, breast changes, and nipple care. Demonstrated manual expression, colostrum noted bilaterally, spoon in room. Given lactation handouts. Aware of OP services and support group. Parents were sleepy and hungry during visit, they may need more education. Mom is call as needed.       Maternal Data Has patient been taught Hand Expression?: Yes Does the patient have breastfeeding experience prior to this delivery?: No  Feeding Feeding Type: Breast Fed Length of feed: 5 min  LATCH Score/Interventions Latch: Repeated attempts needed to sustain latch, nipple held in mouth throughout feeding, stimulation needed to elicit sucking reflex. Intervention(s): Adjust position;Assist with latch;Breast massage;Breast compression  Audible Swallowing: None Intervention(s): Hand expression;Skin to skin  Type of Nipple: Everted at rest and after stimulation  Comfort (Breast/Nipple): Soft / non-tender     Hold (Positioning): Full assist, staff holds infant at breast Intervention(s): Support Pillows;Position options  LATCH Score: 5  Lactation Tools Discussed/Used WIC Program: No   Consult Status Consult Status: Follow-up Date: 06/24/16 Follow-up type: In-patient    Julia Hansen 06/23/2016, 3:03 PM

## 2016-06-23 NOTE — Progress Notes (Addendum)
Spoke with Dr. Juliene PinaMody about pt c/o perineal pain at 6/10 after having Tylenol 650mg  and Motrin 600mg . Pt also has ice packs and Dermoplast spray. Sherald BargeMatthews, Takiyah Bohnsack L  Ordered Hydrocodone 5/325 1 q4h moderate pain for 24 hours per Dr Juliene PinaMody. Sherald BargeMatthews, Arlis Yale L

## 2016-06-24 LAB — CBC
HCT: 27.8 % — ABNORMAL LOW (ref 36.0–46.0)
Hemoglobin: 9.3 g/dL — ABNORMAL LOW (ref 12.0–15.0)
MCH: 30 pg (ref 26.0–34.0)
MCHC: 33.5 g/dL (ref 30.0–36.0)
MCV: 89.7 fL (ref 78.0–100.0)
Platelets: 132 10*3/uL — ABNORMAL LOW (ref 150–400)
RBC: 3.1 MIL/uL — ABNORMAL LOW (ref 3.87–5.11)
RDW: 15.2 % (ref 11.5–15.5)
WBC: 11.9 10*3/uL — ABNORMAL HIGH (ref 4.0–10.5)

## 2016-06-24 MED ORDER — IBUPROFEN 800 MG PO TABS
800.0000 mg | ORAL_TABLET | Freq: Three times a day (TID) | ORAL | Status: DC
  Administered 2016-06-24 – 2016-06-25 (×4): 800 mg via ORAL
  Filled 2016-06-24 (×4): qty 1

## 2016-06-24 MED ORDER — HYDROCORTISONE 2.5 % RE CREA
TOPICAL_CREAM | Freq: Three times a day (TID) | RECTAL | Status: DC
  Administered 2016-06-24 (×3): via RECTAL
  Filled 2016-06-24: qty 28.35

## 2016-06-24 NOTE — Lactation Note (Signed)
This note was copied from a baby's chart. Lactation Consultation Note  Patient Name: Julia Hansen Julia Hansen Reason for consult: Follow-up assessment Baby at 36 hr of life. Mom reports baby is "finally" latching to the R breast and is doing "great" on the L. She denies breast or nipple pain. FOB stated they offered the breast "several" time but baby was too sleepy. Discussed baby behavior, feeding frequency, voids, wt loss, breast changes, and nipple care. Baby was sleeping at this visit and visitors were present. Mom will call at next feeding for latch help.   Maternal Data    Feeding Feeding Type: Breast Fed Length of feed: 15 min  LATCH Score/Interventions Latch: Repeated attempts needed to sustain latch, nipple held in mouth throughout feeding, stimulation needed to elicit sucking reflex. Intervention(s): Adjust position;Assist with latch  Audible Swallowing: Spontaneous and intermittent Intervention(s): Hand expression;Skin to skin  Type of Nipple: Flat (Right ) Intervention(s): Hand pump  Comfort (Breast/Nipple): Soft / non-tender     Hold (Positioning): Assistance needed to correctly position infant at breast and maintain latch.  LATCH Score: 7  Lactation Tools Discussed/Used     Consult Status Consult Status: Follow-up Date: 06/24/16 Follow-up type: In-patient    Julia Hansen Hansen, 5:37 PM

## 2016-06-24 NOTE — Progress Notes (Signed)
Parents had picture of initial weight on the scale of 7.15. Weight initially charted is 8.15. Weight corrected in the chart.

## 2016-06-24 NOTE — Lactation Note (Signed)
This note was copied from a baby's chart. Lactation Consultation Note  Patient Name: Julia Hansen ZOXWR'UToday's Date: 06/24/2016 Reason for consult: Follow-up assessment Baby at 40 hr of life and mom was requesting latch help. Baby has been bf off the L nipple since birth because mom and RNs could not get him to latch to the R nipple. The L nipple is erect and the R nipple is short shafted with an inverted center. We tried manual express and manual stimulation to get nipple to become more erect but it seemed to be inverting more. Applied #20 NS and after many attempts baby was able to latch with short bursts of sucking. Mom has 3 ml of expressed colostrum that was placed in the NS to get baby interested. She has great colostrum flow. Encouraged to try latching to R without the NS but use it if baby is unable to maintain latch. She should do some post expression on the R when she uses the NS and feed back whatever she gets. She is aware of lactation services and support group. She will call as needed.   Maternal Data    Feeding Feeding Type: Breast Fed Length of feed: 25 min  LATCH Score/Interventions Latch: Repeated attempts needed to sustain latch, nipple held in mouth throughout feeding, stimulation needed to elicit sucking reflex. Intervention(s): Adjust position;Assist with latch;Breast massage;Breast compression  Audible Swallowing: Spontaneous and intermittent Intervention(s): Hand expression;Skin to skin  Type of Nipple: Flat  Comfort (Breast/Nipple): Soft / non-tender     Hold (Positioning): Full assist, staff holds infant at breast Intervention(s): Support Pillows;Position options  LATCH Score: 6  Lactation Tools Discussed/Used Tools: Nipple Shields Nipple shield size: 20   Consult Status Consult Status: Follow-up Date: 06/25/16 Follow-up type: In-patient    Rulon Eisenmengerlizabeth E Ninnie Fein 06/24/2016, 9:56 PM

## 2016-06-24 NOTE — Progress Notes (Signed)
PPD 1 SVD with 2nd degree repair  S:  Reports feeling worse today than yesterday / cant sit straight on bottom at all                         Not using Tucks or topical anesthetic - just brought to her this am             Tolerating po/ No nausea or vomiting             Bleeding is light             Pain controlled with motrin and hydrocodone             Up ad lib / ambulatory / voiding QS  Newborn breast feeding  O:               VS: BP 94/57 mmHg  Pulse 82  Temp(Src) 98.1 F (36.7 C) (Oral)  Resp 18  Ht 5\' 2"  (1.575 m)  Wt 79.833 kg (176 lb)  BMI 32.18 kg/m2  SpO2 98%  Breastfeeding? 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

## 2016-06-25 LAB — TYPE AND SCREEN
ABO/RH(D): A NEG
Antibody Screen: POSITIVE
DAT, IgG: NEGATIVE
Unit division: 0
Unit division: 0
Unit division: 0

## 2016-06-25 MED ORDER — COCONUT OIL OIL: 1.0000 "application " | TOPICAL_OIL | Status: DC | PRN

## 2016-06-25 MED ORDER — HYDROCODONE-ACETAMINOPHEN 5-300 MG PO TABS: 1.0000 | ORAL_TABLET | ORAL | Status: DC | PRN

## 2016-06-25 MED ORDER — IBUPROFEN 800 MG PO TABS
800.0000 mg | ORAL_TABLET | Freq: Three times a day (TID) | ORAL | Status: DC
Start: 2016-06-25 — End: 2018-04-08

## 2016-06-25 MED ORDER — HYDROCORTISONE 2.5 % RE CREA: TOPICAL_CREAM | Freq: Three times a day (TID) | RECTAL | Status: DC

## 2016-06-25 NOTE — Discharge Instructions (Signed)
Breast Pumping Tips °If you are breastfeeding, there may be times when you cannot feed your baby directly. Returning to work or going on a trip are common examples. Pumping allows you to store breast milk and feed it to your baby later.  °You may not get much milk when you first start to pump. Your breasts should start to make more after a few days. If you pump at the times you usually feed your baby, you may be able to keep making enough milk to feed your baby without also using formula. The more often you pump, the more milk you will produce.  °WHEN SHOULD I PUMP?  °· You can begin to pump soon after delivery. However, some experts recommend waiting about 4 weeks before giving your infant a bottle to make sure breastfeeding is going well.  °· If you plan to return to work, begin pumping a few weeks before. This will help you develop techniques that work best for you. It also lets you build up a supply of breast milk.   °· When you are with your infant, feed on demand and pump after each feeding.   °· When you are away from your infant for several hours, pump for about 15 minutes every 2-3 hours. Pump both breasts at the same time if you can.   °· If your infant has a formula feeding, make sure to pump around the same time.     °· If you drink any alcohol, wait 2 hours before pumping.   °HOW DO I PREPARE TO PUMP? °Your let-down reflex is the natural reaction to stimulation that makes your breast milk flow. It is easier to stimulate this reflex when you are relaxed. Find relaxation techniques that work for you. If you have difficulty with your let-down reflex, try these methods:  °· Smell one of your infant's blankets or an item of clothing.   °· Look at a picture or video of your infant.   °· Sit in a quiet, private space.   °· Massage the breast you plan to pump.   °· Place soothing warmth on the breast.   °· Play relaxing music.   °WHAT ARE SOME GENERAL BREAST PUMPING TIPS? °· Wash your hands before you pump. You  do not need to wash your nipples or breasts. °· There are three ways to pump. °¨ You can use your hand to massage and compress your breast. °¨ You can use a handheld manual pump. °¨ You can use an electric pump.   °· Make sure the suction cup (flange) on the breast pump is the right size. Place the flange directly over the nipple. If it is the wrong size or placed the wrong way, it may be painful and cause nipple damage.   °· If pumping is uncomfortable, apply a small amount of purified or modified lanolin to your nipple and areola. °· If you are using an electric pump, adjust the speed and suction power to be more comfortable. °· If pumping is painful or if you are not getting very much milk, you may need a different type of pump. A lactation consultant can help you determine what type of pump to use.   °· Keep a full water bottle near you at all times. Drinking lots of fluid helps you make more milk.  °· You can store your milk to use later. Pumped breast milk can be stored in a sealable, sterile container or plastic bag. Label all stored breast milk with the date you pumped it. °¨ Milk can stay out at room temperature for up to 8 hours. °¨   You can store your milk in the refrigerator for up to 8 days. °¨ You can store your milk in the freezer for 3 months. Thaw frozen milk using warm water. Do not put it in the microwave. °· Do not smoke. Smoking can lower your milk supply and harm your infant. If you need help quitting, ask your health care provider to recommend a program.   °WHEN SHOULD I CALL MY HEALTH CARE PROVIDER OR A LACTATION CONSULTANT? °· You are having trouble pumping. °· You are concerned that you are not making enough milk. °· You have nipple pain, soreness, or redness. °· You want to use birth control. Birth control pills may lower your milk supply. Talk to your health care provider about your options. °  °This information is not intended to replace advice given to you by your health care provider.  Make sure you discuss any questions you have with your health care provider. °  °Document Released: 05/15/2010 Document Revised: 11/30/2013 Document Reviewed: 09/17/2013 °[employer]vier Interactive Patient Education ©2016 [employer] °Postpartum Depression and Baby Blues °The postpartum period begins right after the birth of a baby. During this time, there is often a great amount of joy and excitement. It is also a time of many changes in the life of the parents. Regardless of how many times a mother gives birth, each child brings new challenges and dynamics to the family. It is not unusual to have feelings of excitement along with confusing shifts in moods, emotions, and thoughts. All mothers are at risk of developing postpartum depression or the "baby blues." These mood changes can occur right after giving birth, or they may occur many months after giving birth. The baby blues or postpartum depression can be mild or severe. Additionally, postpartum depression can go away rather quickly, or it can be a long-term condition.  °CAUSES °Raised hormone levels and the rapid drop in those levels are thought to be a main cause of postpartum depression and the baby blues. A number of hormones change during and after pregnancy. Estrogen and progesterone usually decrease right after the delivery of your baby. The levels of thyroid hormone and various cortisol steroids also rapidly drop. Other factors that play a role in these mood changes include major life events and genetics.  °RISK FACTORS °If you have any of the following risks for the baby blues or postpartum depression, know what symptoms to watch out for during the postpartum period. Risk factors that may increase the likelihood of getting the baby blues or postpartum depression include: °· Having a personal or family history of depression.   °· Having depression while being pregnant.   °· Having premenstrual mood issues or mood issues related to oral  contraceptives. °· Having a lot of life stress.   °· Having marital conflict.   °· Lacking a social support network.   °· Having a baby with special needs.   °· Having health problems, such as diabetes.   °SIGNS AND SYMPTOMS °Symptoms of baby blues include: °· Brief changes in mood, such as going from extreme happiness to sadness. °· Decreased concentration.   °· Difficulty sleeping.   °· Crying spells, tearfulness.   °· Irritability.   °· Anxiety.   °Symptoms of postpartum depression typically begin within the first month after giving birth. These symptoms include: °· Difficulty sleeping or excessive sleepiness.   °· Marked weight loss.   °· Agitation.   °· Feelings of worthlessness.   °· Lack of interest in activity or food.   °Postpartum psychosis is a very serious condition and can be dangerous. Fortunately, it is   rare. Displaying any of the following symptoms is cause for immediate medical attention. Symptoms of postpartum psychosis include:  °· Hallucinations and delusions.   °· Bizarre or disorganized behavior.   °· Confusion or disorientation.   °DIAGNOSIS  °A diagnosis is made by an evaluation of your symptoms. There are no medical or lab tests that lead to a diagnosis, but there are various questionnaires that a health care provider may use to identify those with the baby blues, postpartum depression, or psychosis. Often, a screening tool called the Edinburgh Postnatal Depression Scale is used to diagnose depression in the postpartum period.  °TREATMENT °The baby blues usually goes away on its own in 1-2 weeks. Social support is often all that is needed. You will be encouraged to get adequate sleep and rest. Occasionally, you may be given medicines to help you sleep.  °Postpartum depression requires treatment because it can last several months or longer if it is not treated. Treatment may include individual or group therapy, medicine, or both to address any social, physiological, and psychological factors  that may play a role in the depression. Regular exercise, a healthy diet, rest, and social support may also be strongly recommended.  °Postpartum psychosis is more serious and needs treatment right away. Hospitalization is often needed. °HOME CARE INSTRUCTIONS °· Get as much rest as you can. Nap when the baby sleeps.   °· Exercise regularly. Some women find yoga and walking to be beneficial.   °· Eat a balanced and nourishing diet.   °· Do little things that you enjoy. Have a cup of tea, take a bubble bath, read your favorite magazine, or listen to your favorite music. °· Avoid alcohol.   °· Ask for help with household chores, cooking, grocery shopping, or running errands as needed. Do not try to do everything.   °· Talk to people close to you about how you are feeling. Get support from your partner, family members, friends, or other new moms. °· Try to stay positive in how you think. Think about the things you are grateful for.   °· Do not spend a lot of time alone.   °· Only take over-the-counter or prescription medicine as directed by your health care provider. °· Keep all your postpartum appointments.   °· Let your health care provider know if you have any concerns.   °SEEK MEDICAL CARE IF: °You are having a reaction to or problems with your medicine. °SEEK IMMEDIATE MEDICAL CARE IF: °· You have suicidal feelings.   °· You think you may harm the baby or someone else. °MAKE SURE YOU: °· Understand these instructions. °· Will watch your condition. °· Will get help right away if you are not doing well or get worse. °  °This information is not intended to replace advice given to you by your health care provider. Make sure you discuss any questions you have with your health care provider. °  °Document Released: 08/29/2004 Document Revised: 11/30/2013 Document Reviewed: 09/06/2013 °Elsevier Interactive Patient Education ©2016 [employer] °Postpartum Care After Vaginal Delivery °After you deliver your newborn  (postpartum period), the usual stay in the hospital is 24-72 hours. If there were problems with your labor or delivery, or if you have other medical problems, you might be in the hospital longer.  °While you are in the hospital, you will receive help and instructions on how to care for yourself and your newborn during the postpartum period.  °While you are in the hospital: °· Be sure to tell your nurses if you have pain or discomfort, as well as   where you feel the pain and what makes the pain worse. °· If you had an incision made near your vagina (episiotomy) or if you had some tearing during delivery, the nurses may put ice packs on your episiotomy or tear. The ice packs may help to reduce the pain and swelling. °· If you are breastfeeding, you may feel uncomfortable contractions of your uterus for a couple of weeks. This is normal. The contractions help your uterus get back to normal size. °· It is normal to have some bleeding after delivery. °¨ For the first 1-3 days after delivery, the flow is red and the amount may be similar to a period. °¨ It is common for the flow to start and stop. °¨ In the first few days, you may pass some small clots. Let your nurses know if you begin to pass large clots or your flow increases. °¨ Do not  flush blood clots down the toilet before having the nurse look at them. °¨ During the next 3-10 days after delivery, your flow should become more watery and pink or brown-tinged in color. °¨ Ten to fourteen days after delivery, your flow should be a small amount of yellowish-white discharge. °¨ The amount of your flow will decrease over the first few weeks after delivery. Your flow may stop in 6-8 weeks. Most women have had their flow stop by 12 weeks after delivery. °· You should change your sanitary pads frequently. °· Wash your hands thoroughly with soap and water for at least 20 seconds after changing pads, using the toilet, or before holding or feeding your newborn. °· You should  feel like you need to empty your bladder within the first 6-8 hours after delivery. °· In case you become weak, lightheaded, or faint, call your nurse before you get out of bed for the first time and before you take a shower for the first time. °· Within the first few days after delivery, your breasts may begin to feel tender and full. This is called engorgement. Breast tenderness usually goes away within 48-72 hours after engorgement occurs. You may also notice milk leaking from your breasts. If you are not breastfeeding, do not stimulate your breasts. Breast stimulation can make your breasts produce more milk. °· Spending as much time as possible with your newborn is very important. During this time, you and your newborn can feel close and get to know each other. Having your newborn stay in your room (rooming in) will help to strengthen the bond with your newborn.  It will give you time to get to know your newborn and become comfortable caring for your newborn. °· Your hormones change after delivery. Sometimes the hormone changes can temporarily cause you to feel sad or tearful. These feelings should not last more than a few days. If these feelings last longer than that, you should talk to your caregiver. °· If desired, talk to your caregiver about methods of family planning or contraception. °· Talk to your caregiver about immunizations. Your caregiver may want you to have the following immunizations before leaving the hospital: °¨ Tetanus, diphtheria, and pertussis (Tdap) or tetanus and diphtheria (Td) immunization. It is very important that you and your family (including grandparents) or others caring for your newborn are up-to-date with the Tdap or Td immunizations. The Tdap or Td immunization can help protect your newborn from getting ill. °¨ Rubella immunization. °¨ Varicella (chickenpox) immunization. °¨ Influenza immunization. You should receive this annual immunization if you did not receive the    immunization during your pregnancy. °  °This information is not intended to replace advice given to you by your health care provider. Make sure you discuss any questions you have with your health care provider. °  °Document Released: 09/22/2007 Document Revised: 08/19/2012 Document Reviewed: 07/22/2012 °[employer]vier Interactive Patient Education ©2016 [employer] °Breastfeeding and Mastitis °Mastitis is inflammation of the breast tissue. It can occur in women who are breastfeeding. This can make breastfeeding painful. Mastitis will sometimes go away on its own. Your health care provider will help determine if treatment is needed. °CAUSES °Mastitis is often associated with a blocked milk (lactiferous) duct. This can happen when too much milk builds up in the breast. Causes of excess milk in the breast can include: °· Poor latch-on. If your baby is not latched onto the breast properly, she or he may not empty your breast completely while breastfeeding. °· Allowing too much time to pass between feedings. °· Wearing a bra or other clothing that is too tight. This puts extra pressure on the lactiferous ducts so milk does not flow through them as it should. °Mastitis can also be caused by a bacterial infection. Bacteria may enter the breast tissue through cuts or openings in the skin. In women who are breastfeeding, this may occur because of cracked or irritated skin. Cracks in the skin are often caused when your baby does not latch on properly to the breast. °SIGNS AND SYMPTOMS °· Swelling, redness, tenderness, and pain in an area of the breast. °· Swelling of the glands under the arm on the same side. °· Fever may or may not accompany mastitis. °If an infection is allowed to progress, a collection of pus (abscess) may develop. °DIAGNOSIS  °Your health care provider can usually diagnose mastitis based on your symptoms and a physical exam. Tests may be done to help confirm the diagnosis. These may include: °· Removal of pus  from the breast by applying pressure to the area. This pus can be examined in the lab to determine which bacteria are present. If an abscess has developed, the fluid in the abscess can be removed with a needle. This can also be used to confirm the diagnosis and determine the bacteria present. In most cases, pus will not be present. °· Blood tests to determine if your body is fighting a bacterial infection. °· Mammogram or ultrasound tests to rule out other problems or diseases. °TREATMENT  °Mastitis that occurs with breastfeeding will sometimes go away on its own. Your health care provider may choose to wait 24 hours after first seeing you to decide whether a prescription medicine is needed. If your symptoms are worse after 24 hours, your health care provider will likely prescribe an antibiotic medicine to treat the mastitis. He or she will determine which bacteria are most likely causing the infection and will then select an appropriate antibiotic medicine. This is sometimes changed based on the results of tests performed to identify the bacteria, or if there is no response to the antibiotic medicine selected. Antibiotic medicines are usually given by mouth. You may also be given medicine for pain. °HOME CARE INSTRUCTIONS °· Only take over-the-counter or prescription medicines for pain, fever, or discomfort as directed by your health care provider. °· If your health care provider prescribed an antibiotic medicine, take the medicine as directed. Make sure you finish it even if you start to feel better. °· Do not wear a tight or underwire bra. Wear a soft, supportive bra. °· Increase your fluid   intake, especially if you have a fever. °· Continue to empty the breast. Your health care provider can tell you whether this milk is safe for your infant or needs to be thrown out. You may be told to stop nursing until your health care provider thinks it is safe for your baby. Use a breast pump if you are advised to stop  nursing. °· Keep your nipples clean and dry. °· Empty the first breast completely before going to the other breast. If your baby is not emptying your breasts completely for some reason, use a breast pump to empty your breasts. °· If you go back to work, pump your breasts while at work to stay in time with your nursing schedule. °· Avoid allowing your breasts to become overly filled with milk (engorged). °SEEK MEDICAL CARE IF: °· You have pus-like discharge from the breast. °· Your symptoms do not improve with the treatment prescribed by your health care provider within 2 days. °SEEK IMMEDIATE MEDICAL CARE IF: °· Your pain and swelling are getting worse. °· You have pain that is not controlled with medicine. °· You have a red line extending from the breast toward your armpit. °· You have a fever or persistent symptoms for more than 2-3 days. °· You have a fever and your symptoms suddenly get worse. °MAKE SURE YOU:  °· Understand these instructions. °· Will watch your condition. °· Will get help right away if you are not doing well or get worse. °  °This information is not intended to replace advice given to you by your health care provider. Make sure you discuss any questions you have with your health care provider. °  °Document Released: 03/22/2005 Document Revised: 11/30/2013 Document Reviewed: 07/01/2013 °[employer]vier Interactive Patient Education ©2016 [employer] ° °Breastfeeding °Deciding to breastfeed is one of the best choices you can make for you and your baby. A change in hormones during pregnancy causes your breast tissue to grow and increases the number and size of your milk ducts. These hormones also allow proteins, sugars, and fats from your blood supply to make breast milk in your milk-producing glands. Hormones prevent breast milk from being released before your baby is born as well as prompt milk flow after birth. Once breastfeeding has begun, thoughts of your baby, as well as his or her sucking or  crying, can stimulate the release of milk from your milk-producing glands.  °BENEFITS OF BREASTFEEDING °For Your Baby °· Your first milk (colostrum) helps your baby's digestive system function better. °· There are antibodies in your milk that help your baby fight off infections. °· Your baby has a lower incidence of asthma, allergies, and sudden infant death syndrome. °· The nutrients in breast milk are better for your baby than infant formulas and are designed uniquely for your baby's needs. °· Breast milk improves your baby's brain development. °· Your baby is less likely to develop other conditions, such as childhood obesity, asthma, or type 2 diabetes mellitus. °For You °· Breastfeeding helps to create a very special bond between you and your baby. °· Breastfeeding is convenient. Breast milk is always available at the correct temperature and costs nothing. °· Breastfeeding helps to burn calories and helps you lose the weight gained during pregnancy. °· Breastfeeding makes your uterus contract to its prepregnancy size faster and slows bleeding (lochia) after you give birth.   °· Breastfeeding helps to lower your risk of developing type 2 diabetes mellitus, osteoporosis, and breast or ovarian cancer later in life. °  SIGNS THAT YOUR BABY IS HUNGRY °Early Signs of Hunger °· Increased alertness or activity. °· Stretching. °· Movement of the head from side to side. °· Movement of the head and opening of the mouth when the corner of the mouth or cheek is stroked (rooting). °· Increased sucking sounds, smacking lips, cooing, sighing, or squeaking. °· Hand-to-mouth movements. °· Increased sucking of fingers or hands. °Late Signs of Hunger °· Fussing. °· Intermittent crying. °Extreme Signs of Hunger °Signs of extreme hunger will require calming and consoling before your baby will be able to breastfeed successfully. Do not wait for the following signs of extreme hunger to occur before you initiate  breastfeeding: °· Restlessness. °· A loud, strong cry. °· Screaming. °BREASTFEEDING BASICS °Breastfeeding Initiation °· Find a comfortable place to sit or lie down, with your neck and back well supported. °· Place a pillow or rolled up blanket under your baby to bring him or her to the level of your breast (if you are seated). Nursing pillows are specially designed to help support your arms and your baby while you breastfeed. °· Make sure that your baby's abdomen is facing your abdomen. °· Gently massage your breast. With your fingertips, massage from your chest wall toward your nipple in a circular motion. This encourages milk flow. You may need to continue this action during the feeding if your milk flows slowly. °· Support your breast with 4 fingers underneath and your thumb above your nipple. Make sure your fingers are well away from your nipple and your baby's mouth. °· Stroke your baby's lips gently with your finger or nipple. °· When your baby's mouth is open wide enough, quickly bring your baby to your breast, placing your entire nipple and as much of the colored area around your nipple (areola) as possible into your baby's mouth. °¨ More areola should be visible above your baby's upper lip than below the lower lip. °¨ Your baby's tongue should be between his or her lower gum and your breast. °· Ensure that your baby's mouth is correctly positioned around your nipple (latched). Your baby's lips should create a seal on your breast and be turned out (everted). °· It is common for your baby to suck about 2-3 minutes in order to start the flow of breast milk. °Latching °Teaching your baby how to latch on to your breast properly is very important. An improper latch can cause nipple pain and decreased milk supply for you and poor weight gain in your baby. Also, if your baby is not latched onto your nipple properly, he or she may swallow some air during feeding. This can make your baby fussy. Burping your baby when  you switch breasts during the feeding can help to get rid of the air. However, teaching your baby to latch on properly is still the best way to prevent fussiness from swallowing air while breastfeeding. °Signs that your baby has successfully latched on to your nipple: °· Silent tugging or silent sucking, without causing you pain. °· Swallowing heard between every 3-4 sucks. °· Muscle movement above and in front of his or her ears while sucking. °Signs that your baby has not successfully latched on to nipple: °· Sucking sounds or smacking sounds from your baby while breastfeeding. °· Nipple pain. °If you think your baby has not latched on correctly, slip your finger into the corner of your baby's mouth to break the suction and place it between your baby's gums. Attempt breastfeeding initiation again. °Signs of Successful Breastfeeding °  Signs from your baby: °· A gradual decrease in the number of sucks or complete cessation of sucking. °· Falling asleep. °· Relaxation of his or her body. °· Retention of a small amount of milk in his or her mouth. °· Letting go of your breast by himself or herself. °Signs from you: °· Breasts that have increased in firmness, weight, and size 1-3 hours after feeding. °· Breasts that are softer immediately after breastfeeding. °· Increased milk volume, as well as a change in milk consistency and color by the fifth day of breastfeeding. °· Nipples that are not sore, cracked, or bleeding. °Signs That Your Baby is Getting Enough Milk °· Wetting at least 3 diapers in a 24-hour period. The urine should be clear and pale yellow by age 5 days. °· At least 3 stools in a 24-hour period by age 5 days. The stool should be soft and yellow. °· At least 3 stools in a 24-hour period by age 7 days. The stool should be seedy and yellow. °· No loss of weight greater than 10% of birth weight during the first 3 days of age. °· Average weight gain of 4-7 ounces (113-198 g) per week after age 4  days. °· Consistent daily weight gain by age 5 days, without weight loss after the age of 2 weeks. °After a feeding, your baby may spit up a small amount. This is common. °BREASTFEEDING FREQUENCY AND DURATION °Frequent feeding will help you make more milk and can prevent sore nipples and breast engorgement. Breastfeed when you feel the need to reduce the fullness of your breasts or when your baby shows signs of hunger. This is called "breastfeeding on demand." Avoid introducing a pacifier to your baby while you are working to establish breastfeeding (the first 4-6 weeks after your baby is born). After this time you may choose to use a pacifier. Research has shown that pacifier use during the first year of a baby's life decreases the risk of sudden infant death syndrome (SIDS). °Allow your baby to feed on each breast as long as he or she wants. Breastfeed until your baby is finished feeding. When your baby unlatches or falls asleep while feeding from the first breast, offer the second breast. Because newborns are often sleepy in the first few weeks of life, you may need to awaken your baby to get him or her to feed. °Breastfeeding times will vary from baby to baby. However, the following rules can serve as a guide to help you ensure that your baby is properly fed: °· Newborns (babies 4 weeks of age or younger) may breastfeed every 1-3 hours. °· Newborns should not go longer than 3 hours during the day or 5 hours during the night without breastfeeding. °· You should breastfeed your baby a minimum of 8 times in a 24-hour period until you begin to introduce solid foods to your baby at around 6 months of age. °BREAST MILK PUMPING °Pumping and storing breast milk allows you to ensure that your baby is exclusively fed your breast milk, even at times when you are unable to breastfeed. This is especially important if you are going back to work while you are still breastfeeding or when you are not able to be present during  feedings. Your lactation consultant can give you guidelines on how long it is safe to store breast milk. °A breast pump is a machine that allows you to pump milk from your breast into a sterile bottle. The pumped breast milk can   then be stored in a refrigerator or freezer. Some breast pumps are operated by hand, while others use electricity. Ask your lactation consultant which type will work best for you. Breast pumps can be purchased, but some hospitals and breastfeeding support groups lease breast pumps on a monthly basis. A lactation consultant can teach you how to hand express breast milk, if you prefer not to use a pump. °CARING FOR YOUR BREASTS WHILE YOU BREASTFEED °Nipples can become dry, cracked, and sore while breastfeeding. The following recommendations can help keep your breasts moisturized and healthy: °· Avoid using soap on your nipples. °· Wear a supportive bra. Although not required, special nursing bras and tank tops are designed to allow access to your breasts for breastfeeding without taking off your entire bra or top. Avoid wearing underwire-style bras or extremely tight bras. °· Air dry your nipples for 3-4 minutes after each feeding. °· Use only cotton bra pads to absorb leaked breast milk. Leaking of breast milk between feedings is normal. °· Use lanolin on your nipples after breastfeeding. Lanolin helps to maintain your skin's normal moisture barrier. If you use pure lanolin, you do not need to wash it off before feeding your baby again. Pure lanolin is not toxic to your baby. You may also hand express a few drops of breast milk and gently massage that milk into your nipples and allow the milk to air dry. °In the first few weeks after giving birth, some women experience extremely full breasts (engorgement). Engorgement can make your breasts feel heavy, warm, and tender to the touch. Engorgement peaks within 3-5 days after you give birth. The following recommendations can help ease  engorgement: °· Completely empty your breasts while breastfeeding or pumping. You may want to start by applying warm, moist heat (in the shower or with warm water-soaked hand towels) just before feeding or pumping. This increases circulation and helps the milk flow. If your baby does not completely empty your breasts while breastfeeding, pump any extra milk after he or she is finished. °· Wear a snug bra (nursing or regular) or tank top for 1-2 days to signal your body to slightly decrease milk production. °· Apply ice packs to your breasts, unless this is too uncomfortable for you. °· Make sure that your baby is latched on and positioned properly while breastfeeding. °If engorgement persists after 48 hours of following these recommendations, contact your health care provider or a lactation consultant. °OVERALL HEALTH CARE RECOMMENDATIONS WHILE BREASTFEEDING °· Eat healthy foods. Alternate between meals and snacks, eating 3 of each per day. Because what you eat affects your breast milk, some of the foods may make your baby more irritable than usual. Avoid eating these foods if you are sure that they are negatively affecting your baby. °· Drink milk, fruit juice, and water to satisfy your thirst (about 10 glasses a day). °· Rest often, relax, and continue to take your prenatal vitamins to prevent fatigue, stress, and anemia. °· Continue breast self-awareness checks. °· Avoid chewing and smoking tobacco. Chemicals from cigarettes that pass into breast milk and exposure to secondhand smoke may harm your baby. °· Avoid alcohol and drug use, including marijuana. °Some medicines that may be harmful to your baby can pass through breast milk. It is important to ask your health care provider before taking any medicine, including all over-the-counter and prescription medicine as well as vitamin and herbal supplements. °It is possible to become pregnant while breastfeeding. If birth control is desired, ask your health care    provider about options that will be safe for your baby. °SEEK MEDICAL CARE IF: °· You feel like you want to stop breastfeeding or have become frustrated with breastfeeding. °· You have painful breasts or nipples. °· Your nipples are cracked or bleeding. °· Your breasts are red, tender, or warm. °· You have a swollen area on either breast. °· You have a fever or chills. °· You have nausea or vomiting. °· You have drainage other than breast milk from your nipples. °· Your breasts do not become full before feedings by the fifth day after you give birth. °· You feel sad and depressed. °· Your baby is too sleepy to eat well. °· Your baby is having trouble sleeping.   °· Your baby is wetting less than 3 diapers in a 24-hour period. °· Your baby has less than 3 stools in a 24-hour period. °· Your baby's skin or the white part of his or her eyes becomes yellow.   °· Your baby is not gaining weight by 5 days of age. °SEEK IMMEDIATE MEDICAL CARE IF: °· Your baby is overly tired (lethargic) and does not want to wake up and feed. °· Your baby develops an unexplained fever. °  °This information is not intended to replace advice given to you by your health care provider. Make sure you discuss any questions you have with your health care provider. °  °Document Released: 11/25/2005 Document Revised: 08/16/2015 Document Reviewed: 05/19/2013 °Elsevier Interactive Patient Education ©2016 [employer] ° °

## 2016-06-25 NOTE — Discharge Summary (Signed)
OB Discharge Summary     Patient Name: Julia Hansen DOB: [birth year] MRN: [id number]  Date of admission: 06/21/2016 Delivering MD: Genia Del   Date of discharge: 06/25/2016  Admitting diagnosis: 37wks SROM Intrauterine pregnancy: [redacted]w[redacted]d     Secondary diagnosis:  Principal Problem:   Postpartum care following vaginal delivery (7/16) Active Problems:   Preterm premature rupture of membranes   Postpartum state   SVD (spontaneous vaginal delivery) (7/16)  Additional problems: 2nd degree perineal laceration     Discharge diagnosis: Term Pregnancy Delivered                                                                                                Post partum procedures:none  Augmentation: Pitocin  Complications: None  Hospital course:  Onset of Labor With Vaginal Delivery     30 y.o. yo G1P1001 at [redacted]w[redacted]d was admitted in Latent Labor on 06/21/2016. Patient had an uncomplicated labor course as follows:  Membrane Rupture Time/Date: 6:24 PM ,06/21/2016   Intrapartum Procedures: Episiotomy: None [1]                                         Lacerations:  2nd degree [3];Vaginal [6]  Patient had a delivery of a Viable infant. 06/23/2016  Information for the patient's newborn:  Avalynn, Bowe [[id number]]  Delivery Method: Vaginal, Spontaneous Delivery (Filed from Delivery Summary)    Pateint had an uncomplicated postpartum course.  She is ambulating, tolerating a regular diet, passing flatus, and urinating well. Patient is discharged home in stable condition on 06/25/2016.    Physical exam  Filed Vitals:   06/23/16 2233 06/24/16 0532 06/24/16 1730 06/25/16 0633  BP: 109/61 94/57 106/70 107/65  Pulse: 72 82 82 79  Temp: 98.2 F (36.8 C) 98.1 F (36.7 C) 98.3 F (36.8 C) 98.6 F (37 C)  TempSrc: Oral Oral Oral Oral  Resp: Height:      Weight:      SpO2: 98%  100%    General: alert, cooperative and no distress Lochia: appropriate Uterine  Fundus: firm, midline, U-even Perineum: Healing well with no significant drainage, No significant erythema, mild edema and ecchymosis noted DVT Evaluation: No evidence of DVT seen on physical exam. Negative Homan's sign. No cords or calf tenderness. No significant calf/ankle edema. Labs: Lab Results  Component Value Date   WBC 11.9* 06/24/2016   HGB 9.3* 06/24/2016   HCT 27.8* 06/24/2016   MCV 89.7 06/24/2016   PLT 132* 06/24/2016   CMP Latest Ref Rng 10/10/2014  Glucose 70 - 99 mg/dL 94  BUN 6 - 23 mg/dL 13  Creatinine 8.46 - 9.62 mg/dL 9.52  Sodium 841 - 324 mEq/L 138  Potassium 3.7 - 5.3 mEq/L 4.3  Chloride 96 - 112 mEq/L 99  CO2 19 - 32 mEq/L 26  Calcium 8.4 - 10.5 mg/dL 9.5  Total Protein 6.0 - 8.3 g/dL 8.3  Total Bilirubin 0.3 - 1.2 mg/dL <4.0(N)  Alkaline Phos  39 - 117 U/L 59  AST 0 - 37 U/L 35  ALT 0 - 35 U/L 57(H)    Discharge instruction: per After Visit Summary and "Baby and Me Booklet".  After visit meds:    Medication List    TAKE these medications        calcium carbonate 500 MG chewable tablet  Commonly known as:  TUMS - dosed in mg elemental calcium  Chew 1-2 tablets by mouth 2 (two) times daily as needed for indigestion or heartburn.     CALCIUM PO  Take 1 tablet by mouth daily.     coconut oil Oil  Apply 1 application topically as needed.     DHA OMEGA 3 PO  Take 1 capsule by mouth 2 (two) times daily.     Hydrocodone-Acetaminophen 5-300 MG Tabs  Take 1 tablet by mouth every 4 (four) hours as needed.     hydrocortisone 2.5 % rectal cream  Commonly known as:  ANUSOL-HC  Place rectally 3 (three) times daily.     ibuprofen 800 MG tablet  Commonly known as:  ADVIL,MOTRIN  Take 1 tablet (800 mg total) by mouth every 8 (eight) hours.     prenatal multivitamin Tabs tablet  Take 1 tablet by mouth daily at 12 noon.     ranitidine 150 MG tablet  Commonly known as:  ZANTAC  Take 150 mg by mouth daily as needed for heartburn.        Diet:  routine diet  Activity: Advance as tolerated. Pelvic rest for 6 weeks.   Outpatient follow up:6 weeks Follow up Appt:No future appointments. Follow up Visit:No Follow-up on file.  Postpartum contraception: Undecided  Newborn Data: Live born female "Trellis Momentddie [name] IV" on 06/23/2016 Birth Weight: 7 lb 15 oz (3600 g) APGAR: 8, 9  Baby Feeding: Breast Disposition:home with mother   06/25/2016 Raelyn MoraAWSON, Konnie Noffsinger, Judie PetitM, CNM

## 2016-06-25 NOTE — Lactation Note (Signed)
This note was copied from a baby's chart. Lactation Consultation Note  Patient Name: Julia Hansen ZOXWR'UToday's Date: 06/25/2016 Reason for consult: Follow-up assessment;Breast/nipple pain  Mom has history of anxiety disorder, stopped her meds (Zoloft, Buspar, Xanax) during pregnancy.  Mom having lots of questions, and uncertainty with positioning and latch.  LC spent a lot of time reassuring her and teaching about basics while doing hands on.  Baby latched deeply, without pain onto left breast in football hold.  Mom was able to assess his lower lip, as he tends to curl it in.  Latch was comfortable, and baby had a suck/swallow ratio of 1:1.  Milk volume coming in.  Baby fed for 25 minutes, and nipple rounded and no trauma noted.  Switched onto right side where nipple is erect, but tip is dimpled in.  At last feeding, baby came off the breast using the nipple shield, and nipple was bleeding.  Reminded Mom of importance of manually expressing her colostrum onto nipple.  Demonstrated this, and return demo did.  Transitional milk coming in, and flow plentiful.  Baby latched onto right side without use of a nipple shield.  After 5-10 mins, took him off, as Mom wanted LC to show her how to properly place a nipple shield.  Nipple was everted fully, but very pink, no bleeding noted.  Tried with 24 mm nipple shield, but switched to 20 mm for a better fit and seal.  Baby latched in cross cradle hold, using proper breast support and sandwiching to facilitate a deep areolar grasp.  Mom denied any discomfort, and baby feeding with multiple swallows.  Mom has a manual breast pump.  To obtain a DEBP through their insurance company.  Engorgement prevention and treatment discussed.  Encouraging frequent skin to skin, and feeding baby >8 times in 24 hrs.  Reminded Mom and FOB about OP lactation services and BFSGs offered, including [facility] After Birth group.  OP appointment made for reassurance, and follow up Nipple Shield  use and nipple trauma, 07/03/16 @ 1pm.    Consult Status Consult Status: Follow-up Date: 07/03/16 Follow-up type: Out-patient    [name], [name] 06/25/2016, 10:56 AM

## 2016-06-25 NOTE — Progress Notes (Addendum)
Patient ID: Bunnie PhilipsMonica W Dematteo, female   DOB: 08/13/1986, 30 y.o.   MRN: [id number] Post Partum Day #2  SVD with 2nd degree laceration   Information for the patient's newborn:  Felecia ShellingSouther, Boy Fate [[id number]][[id number]]  female   / circumcision done Feeding: breast with some difficulty latching and now bleeding of RT nipple / using nipple shield  Subjective: No HA, SOB, CP, F/C, breast symptoms. Pain managed with ibuprofen and narcotic medication. Normal vaginal bleeding, no clots.      Objective:  Temp:  [98.3 F (36.8 C)-98.6 F (37 C)] 98.6 F (37 C) (07/18 [id number]) Pulse Rate:  [79-82] 79 (07/18 0633) Resp:  [18] 18 (07/18 0633) BP: (106-107)/(65-70) 107/65 mmHg (07/18 0633) SpO2:  [100 %] 100 % (07/17 1730)  No intake or output data in the 24 hours ending 06/25/16 0831     Recent Labs  06/23/16 0809 06/24/16 0535  WBC 18.0* 11.9*  HGB 11.0* 9.3*  HCT 33.1* 27.8*  PLT 140* 132*    Blood type: --/--/A NEG (07/14 2100) Infant Rh NEG - Rhophylac NOT indicated Rubella: Immune (01/05 0000)    Physical Exam:  General: alert, cooperative, fatigued and no distress Uterine Fundus: firm, midline, U-even Lochia: appropriate Perineum: 2nd degree repair healing well, mild edema, ecchymosis, no drainage noted DVT Evaluation: No evidence of DVT seen on physical exam. Negative Homan's sign. No cords or calf tenderness. No significant calf/ankle edema.   Assessment/Plan: PPD # 2 / 30 y.o., G1P1001 S/P: spontaneous vaginal with 2nd degree laceration  Principal Problem:    Postpartum care following vaginal delivery (7/16)  Active Problems:    Preterm premature rupture of membranes    H/O Anxiety - has Buspar at home   normal postpartum exam  Continue current postpartum care  D/C home  Advised to resume Buspar once home for anxiety - pt states "I will only use it if I think I need it.  I really don't want to give the baby any meds like that."   LOS: 4 days   Kenard GowerAWSON, Nicolaus Andel, M, MSN,  CNM 06/25/2016, 8:31 AM

## 2016-06-26 ENCOUNTER — Telehealth (HOSPITAL_COMMUNITY): Payer: Self-pay | Admitting: Lactation Services

## 2016-06-26 NOTE — Telephone Encounter (Signed)
Her breast are hard today. Ice packs, frequent feeding, and pump as needed. Call back as needed.

## 2016-06-28 ENCOUNTER — Telehealth (HOSPITAL_COMMUNITY): Payer: Self-pay | Admitting: Lactation Services

## 2016-06-28 NOTE — Telephone Encounter (Signed)
Mom called with concerns that that baby is feeding all the time and she is not getting any rest. Using a NS on one nipple because it is inverted. Breasts became very hard on Wednesday. States she is having trouble remembering what to do. Reviewed unwrapping and undressing baby, nursing skin to skin, massage and compress breast while he is nursing. Encouraged to sleep when baby sleeps. Can pump for comfort. Has curved tip syringe. Dad can syringe feed pumped milk to calm him. Mom crying on phone. Encouragement given. OP appointment moved from Tuesdayt to Monday at 10:30 No further questions at present. To call prn

## 2016-07-01 ENCOUNTER — Ambulatory Visit (HOSPITAL_COMMUNITY)
Admission: RE | Admit: 2016-07-01 | Discharge: 2016-07-01 | Disposition: A | Discharge status: Home / Self Care | Payer: 59 | Source: Ambulatory Visit | Attending: Obstetrics & Gynecology | Admitting: Obstetrics & Gynecology

## 2016-07-01 NOTE — Lactation Note (Signed)
Lactation Consult - 7/24 O/P for Baby Eddie "Julia Hansen" Vandy[place] and mom Hessie Knows at 10:30 am   Mother's reason for visit: per mom Help  Visit Type:  Feeding assessment  Appointment Notes:   Consult:  Initial Lactation Consultant:  Kathrin Greathouse  ________________________________________________________________________ Joan Flores Name:  Julia Hansen Date of Birth:  06/23/2018 Pediatrician:  Dr. Aggie Hacker  Gender:  female Gestational Age: [redacted]w[redacted]d (At Birth) Birth Weight:  7 lb 15 oz (3600 g) Weight at Discharge:  Weight: 7 lb 6.9 oz (3370 g)                                   Date of Discharge:  06/25/2016      Filed Weights   06/23/16 0536 06/23/16 2344 06/24/16 2323  Weight: 7 lb 15 oz (3600 g) 7 lb 12.2 oz (3521 g) 7 lb 6.9 oz (3370 g)  Last weight taken from location outside of [facility]: 7/20 - 7-8 oz    Location:Pediatrician's office Weight today: 7-11.0 oz 3488 g  ________________________________________________________________  Mother's Name: Silva Bandy Forbush Type of delivery:  Vaginal Delivery  Breastfeeding Experience:  1st baby  Maternal Medical Conditions:  No risk for milk supply production - Mom does have does have hx anxiety , panic attacks and today  exhibits at consult being exhausted and mentioning she hasn't slept much in the last 7 days. Appears with dark circles under her eyes.  Maternal Medications:  PNV , Motrin , Stool Softers  ________________________________________________________________________  Breastfeeding History (Post Discharge) Per mom - Julia Hansen is feeding every 1.5 -3 hours and on feeding cues for 15-23 mins , averages breast feeding both sides  5- 6 wets or more in 24 hours ( yellow)  5-6 or more yellow stools in 24 hours  Pumping - once daily for the last 2 days or more using a DEBP Medela  Obtaining 3-5 ozs after feeding him No supplementing and using pacifier some  Per mom and dad - mom isn't getting much sleep in the last week  since going home , difficult to settle down   ________________________________________________________________________  Maternal Breast Assessment  Breast:  Full Nipple:  Erect on the left / semi inverted on the right  Pain level:  3 ( right only , some skin breakdown - abrasion )  Pain interventions:  Expressed breast milk  _______________________________________________________________________ Feeding Assessment/Evaluation  Initial feeding assessment:  Infant's oral assessment:  Variance -  Oral exam with a glove by LC - short labial frenulum ( able to stretch upper lip with exam and at the latch when breast isn't overfull)  ( dad mentioned has a teenager had to have a labial frenotomy for braces )  Short anterior lingual frenulum noted with some mobility )  High palate   Positioning:  Football Left breast  LATCH documentation:  Latch:  2 = Grasps breast easily, tongue down, lips flanged, rhythmical sucking.  Audible swallowing:  2 = Spontaneous and intermittent  Type of nipple:  2 = Everted at rest and after stimulation  Comfort (Breast/Nipple):  1 = Filling, red/small blisters or bruises, mild/mod discomfort  Hold (Positioning):  1 = Assistance needed to correctly position infant at breast and maintain latch  LATCH score: 8   Attached assessment:  Deep  Lips flanged:  No. ( LC had to flip upper lip to flanged position ) , also had to hand express off  1st   Lips untucked:  No.- eased chin down and positioned hips and help with depth   Suck assessment:  Nutritive  Tools:  None at this latch   Pre-feed weight:  3488 g , 7-11.0 oz  Post-feed weight:  3546 g , 7-13.1 oz  Amount transferred: 58 ml  Amount supplemented:  None   Additional Feeding Assessment -   Infant's oral assessment:  See above note   Positioning:  Football Right breast  LATCH documentation:  Latch:  2 = Grasps breast easily, tongue down, lips flanged, rhythmical sucking.  Audible swallowing:  2  = Spontaneous and intermittent  Type of nipple:  0 = Inverted ( improved with #24 NS )   Comfort (Breast/Nipple):  1 = Filling, red/small blisters or bruises, mild/mod discomfort  Hold (Positioning):  1 = Assistance needed to correctly position infant at breast and maintain latch  LATCH score:  6   Attached assessment:  Shallow  Lips flanged:  No.  Lips untucked:  No.  Suck assessment:  Nutritive and Nonnutritive  Tools:  Nipple shield 24 mm Instructed on use and cleaning of tool:  Yes.    Pre-feed weight: 3542 g , 7-13.0 oz  Post-feed weight: 3550 g , 7-13.2 oz  Amount transferred:  8 ml  Amount supplemented:  None   Total amount pumped post feed: hand expressed of right 10 ml   Total amount transferred: 66 ml  Total supplement given:  None needed   Lactation Impression:  Baby has oral variance - see note above and when latched noted to have intermittent rubbing sound.  Mom exhausted and dad is too m - LC STRESSED BOTH NEEDED TO GET TOM REST AND LIMITED VISITORS 48-72 HOURS.  Baby did well transferring off milk on the left breast without the NS  On the right due to sore semi inverted nipple had to use NS for latch - nipple more well rounded and erect after baby released.  See LC Plan below - LC O/P F/U next Monday 7/31 at [facility] to reassess - hopefully mom will be more rested so she can take in the information better.  Lactation Plan of Care:  Per mom F/U weight check Thursday July 27 th with smart start  Mom - rest , naps ( highly encouraged )  Plenty fluids , especially water , nutritious snacks and meals  Steps for latching  Change the baby's diaper  Skin to skin feedings until the baby can stay awake for a feeding  When latching - don't allow baby to nibble onto the breast - tickle upper lip until wide open mouth  And latch with breast compressions, mom comfortable and then intermittent with feeding  Feed long enough 1st breast to soften well  If Julia Hansen comes off after 10  mins , takes a break and then is cuing re-latch on same breast to soften well  Offer 2nd breast  If not release 2nd breast breast down 10 mins  Important - growth spurts - 7-10 days , 3 weeks , 6 weeks - cluster feedings normal  Average feeding - 1st breast - soften well approx 15 -20 mins - if in a good swallowing pattern let baby finish  Watch Julia Hansen's body language for being full and watch for non - nutritive feeding patterns - needs to be active when feeding Extra pumping - if breast are still feeling full after Julia Hansen feeds - may need to pump down 10 mins  If Julia Hansen only feeds 1st  breast - release 2nd breast down.

## 2016-07-03 ENCOUNTER — Ambulatory Visit (HOSPITAL_COMMUNITY): Payer: Managed Care, Other (non HMO)

## 2016-07-08 ENCOUNTER — Ambulatory Visit (HOSPITAL_COMMUNITY)
Admission: RE | Admit: 2016-07-08 | Discharge: 2016-07-08 | Disposition: A | Discharge status: Home / Self Care | Payer: 59 | Source: Ambulatory Visit | Attending: Obstetrics & Gynecology | Admitting: Obstetrics & Gynecology

## 2016-07-08 NOTE — Lactation Note (Signed)
Lactation Consult  Mother's reason for visit:  F/U  Visit Type:  Feeding assessment  Appointment Notes: F/U , reassessment of Frenulum  Consult:  Follow-Up Lactation Consultant:  Kathrin Greathouse  ________________________________________________________________________ Julia Hansen Name:  Julia Hansen IV Date of Birth:  06/23/2018 Pediatrician:  Dr. Aggie Hacker  Gender:  female Gestational Age: [redacted]w[redacted]d (At Birth) Birth Weight:  7 lb 15 oz (3600 g) Weight at Discharge:  Weight: 7 lb 6.9 oz (3370 g)                                   Date of Discharge:  06/25/2016      Filed Weights   06/23/16 0536 06/23/16 2344 06/24/16 2323  Weight: 7 lb 15 oz (3600 g) 7 lb 12.2 oz (3521 g) 7 lb 6.9 oz (3370 g)  Last weight taken from location outside of [facility]:  7-15 oz     Location:[facility] last 7/27  Weight today: 8-3.3 oz 3722 g   ________________________________________________________________________  Mother's Name: Julia Hansen Type of delivery:  Vaginal Delivery  Breastfeeding Experience: 1st baby  Maternal Medical Conditions:  No risk , except anxiety  Maternal Medications:  PNV   ________________________________________________________________________  Breastfeeding History (Post Discharge)  Frequency of breastfeeding:  Every 2-3 hours days , nights every 3 most of the time , sometimes 4 hours.  Per mom baby is getting 8 plus feedings in by 10 pm  Duration of feeding:  40 mins   Supplementing: 2 oz of EBM from a bottle   Pumping : 1or 2 times day after feedings or if the abby gets a bottle for a feeding .  30 ml on the right , 45 -60 ml on the left   Infant Intake and Output Assessment  Voids:  6  in 24 hrs.  Color:  Clear yellow Stools:  6  in 24 hrs.  Color:  Yellow  ________________________________________________________________________  Maternal Breast Assessment  Breast:  Filling Nipple:  Erect Pain level:  3 only on the right with latching  Pain  interventions:  Expressed breast milk  _______________________________________________________________________ Feeding Assessment/Evaluation  Initial feeding assessment:  Infant's oral assessment:  Variance - high palate , recess chin, short labial frenulum , upper lip stretches with exam  And at the latch, LC suspects short posterior frenulum  Positioning:  Football Right breast  LATCH documentation:  Latch:  2 = Grasps breast easily, tongue down, lips flanged, rhythmical sucking.  Audible swallowing:  2 = Spontaneous and intermittent  Type of nipple:  2 = Everted at rest and after stimulation  Comfort (Breast/Nipple):  1 = Filling, red/small blisters or bruises, mild/mod discomfort  Hold (Positioning):  1 = Assistance needed to correctly position infant at breast and maintain latch  LATCH score:  8   Attached assessment:  Deep  Lips flanged:  No. LC assisted to flip upper lip to flanged position   Lips untucked:  No. eased chin   Suck assessment:  Nutritive  Tools:  None  Feeding 15 mins - right breast .  Pre-feed weight:  3722 g , 8-3.3 oz  Post-feed weight:  3740 g , 8-3.9 oz  Amount transferred:  18 ml  Amount supplemented:  None   Wet diaper changed - re-weight   Additional Feeding Assessment -   Infant's oral assessment:  Variance - see note above  Left breast with depth , latch score 8 /  feeding 15 mins   Pre-feed weight:  3724 g , 8-3.4 oz  Post-feed weight:  3758 g , 8-4.5 oz  Amount transferred:  34 ml  Amount supplemented:  None  Wet changed - re-weight   Re- latched left breast with depth - Latch score - 8  / feeding 10 mins  Pre- feed weight: 3736 g , 8-3.8 oz  Post - feed weight: 3754 g , 8-4.4 oz  Amount transferred: 18 ml  Amount supplement : none needed   Total amount pumped post feed:  R 47 ml     L 37 ml   Total amount transferred:  70 ml  Total supplement given:  None needed  Amount post pump: 84 ml  Total off the breast at consult - 154  ml   Lactation Impression:  Oral variance - Short labial frenulum - and suspect posterior short lingual frenulum ( noted to [facility] O[facility] last week and now today )  Intermittent rubbing noise still present . And on moms right breast with latch is still causing initially discomfort ( nipple is no longer semi inverted - erect today)  LC's CONCERN IS THE THE BABY FED 15 MINS ON THE RIGHT AND ONLY TRANSFERRED OF 18 ML ( AND MOM PUMPED OFF 47 AFTER WARDS ) - THEREFORE  Lc FEELS THE BABY'S TONGUE DECREASED MOBILITY IF LIMITING THE AMOUNT HE CAN TRANSFERS AND FEEDINGS ARE  TAKING A LONG TIME.  AS FOR THE LEFT 2 DIFFERENT LATCHES ( 15 AND 10 MINS , AND TRANSFERRED OFF 34 ML / AND 18 ML) AND MOM POST PUMPED OFF 37 ML.  MILK SUPPLY ISN'T THE ISSUE.  70 MILK TRANSFER IS EXCELLENT F57R  A 35 WEEK OLD INFANT - IT'S JUST THE AMOUNT TIME THE FEEDINGS TOOK .  TONGUE - TIE RESOURCES GIVEN TO THE PARENTS FOR THE 2ND TIME WITH DISCUSSION.  ( DAD ACTUALLY MENTIONED HE HAD TO HAVE A LABIAL FRENOTOMY AS A TEENAGER). Mom seemed much calmer today compared to last week [facility] O/P consult . She mentioned she still has difficult time getting rest during the day and that is frustrating to her. Also that she feels anxious when tired. She denies feeling depressed. LC recommended the "Feelings after birth support group on Tuesday 's at [facility] at 10 and  The [facility] at 11 am Tuesday , or 7 pm on Monday evenings. Also to feel free to call [facility] office with BF concerns.   Lactation Plan of Care: Consider having "[name] " assessed by and oral specialist  See resource  Praised mom for her efforts breast feeding and pumping and dad's support  Feedings - every 2-3 hours Days / Evenings , nights 3 - 4 hours  Growth spurts - 3 weeks , 6 weeks , cluster feedings normal  Reminders - EBM to nipples liberally  When [name] receives a bottle from dad or Grandma - make sure they are working with him to open  Wide 1st  Extra pumping - 3 times a day - after am ,  midday and after noon - both breast 10 -15 mins - save milk  Transitioning at 4 weeks tp pre- pare to go back to work at 6 weeks  @ 4 weeks one bottle a day 3-4 oz  If [name] - receives a bottle for a feeding - need to pump both breast for 15 -20 mins.  LC F/U PRN - Phone calls , [facility] or call for appt.

## 2017-01-02 ENCOUNTER — Ambulatory Visit (INDEPENDENT_AMBULATORY_CARE_PROVIDER_SITE_OTHER): Payer: 59 | Admitting: Internal Medicine

## 2017-01-02 ENCOUNTER — Encounter: Payer: Self-pay | Admitting: Internal Medicine

## 2017-01-02 VITALS — BP 118/68 | HR 76 | Temp 98.4°F | Resp 18 | Ht 61.5 in | Wt 156.0 lb

## 2017-01-02 DIAGNOSIS — Z79899 Other long term (current) drug therapy: Secondary | ICD-10-CM

## 2017-01-02 DIAGNOSIS — Z1389 Encounter for screening for other disorder: Secondary | ICD-10-CM

## 2017-01-02 DIAGNOSIS — F5104 Psychophysiologic insomnia: Secondary | ICD-10-CM

## 2017-01-02 DIAGNOSIS — Z130 Encounter for screening for diseases of the blood and blood-forming organs and certain disorders involving the immune mechanism: Secondary | ICD-10-CM

## 2017-01-02 DIAGNOSIS — Z0000 Encounter for general adult medical examination without abnormal findings: Secondary | ICD-10-CM

## 2017-01-02 DIAGNOSIS — Z131 Encounter for screening for diabetes mellitus: Secondary | ICD-10-CM

## 2017-01-02 DIAGNOSIS — E559 Vitamin D deficiency, unspecified: Secondary | ICD-10-CM

## 2017-01-02 DIAGNOSIS — Z1329 Encounter for screening for other suspected endocrine disorder: Secondary | ICD-10-CM

## 2017-01-02 DIAGNOSIS — Z13220 Encounter for screening for lipoid disorders: Secondary | ICD-10-CM

## 2017-01-02 LAB — CBC WITH DIFFERENTIAL/PLATELET
Basophils Absolute: 0 cells/uL (ref 0–200)
Basophils Relative: 0 %
Eosinophils Absolute: 140 cells/uL (ref 15–500)
Eosinophils Relative: 2 %
HCT: 42.4 % (ref 35.0–45.0)
Hemoglobin: 14.1 g/dL (ref 11.7–15.5)
Lymphocytes Relative: 33 %
Lymphs Abs: 2310 cells/uL (ref 850–3900)
MCH: 29.3 pg (ref 27.0–33.0)
MCHC: 33.3 g/dL (ref 32.0–36.0)
MCV: 88.1 fL (ref 80.0–100.0)
MPV: 10.8 fL (ref 7.5–12.5)
Monocytes Absolute: 490 cells/uL (ref 200–950)
Monocytes Relative: 7 %
Neutro Abs: 4060 cells/uL (ref 1500–7800)
Neutrophils Relative %: 58 %
Platelets: 227 10*3/uL (ref 140–400)
RBC: 4.81 MIL/uL (ref 3.80–5.10)
RDW: 14.1 % (ref 11.0–15.0)
WBC: 7 10*3/uL (ref 3.8–10.8)

## 2017-01-02 LAB — TSH: TSH: 1.52 mIU/L

## 2017-01-02 MED ORDER — TRAZODONE HCL 150 MG PO TABS: 150.0000 mg | ORAL_TABLET | Freq: Every day | ORAL | 0 refills | Status: DC

## 2017-01-02 MED ORDER — FLUTICASONE PROPIONATE 50 MCG/ACT NA SUSP: 2.0000 | Freq: Every day | NASAL | 3 refills | Status: DC

## 2017-01-02 MED ORDER — PROMETHAZINE-DM 6.25-15 MG/5ML PO SYRP: ORAL_SOLUTION | ORAL | 1 refills | Status: DC

## 2017-01-02 NOTE — Patient Instructions (Signed)
Trazodone tablets What is this medicine? TRAZODONE (TRAZ oh done) is used to treat depression. This medicine may be used for other purposes; ask your health care provider or pharmacist if you have questions. COMMON BRAND NAME(S): Desyrel What should I tell my health care provider before I take this medicine? They need to know if you have any of these conditions: -attempted suicide or thinking about it -bipolar disorder -bleeding problems -glaucoma -heart disease, or previous heart attack -irregular heart beat -kidney or liver disease -low levels of sodium in the blood -an unusual or allergic reaction to trazodone, other medicines, foods, dyes or preservatives -pregnant or trying to get pregnant -breast-feeding How should I use this medicine? Take this medicine by mouth with a glass of water. Follow the directions on the prescription label. Take this medicine shortly after a meal or a light snack. Take your medicine at regular intervals. Do not take your medicine more often than directed. Do not stop taking this medicine suddenly except upon the advice of your doctor. Stopping this medicine too quickly may cause serious side effects or your condition may worsen. A special MedGuide will be given to you by the pharmacist with each prescription and refill. Be sure to read this information carefully each time. Talk to your pediatrician regarding the use of this medicine in children. Special care may be needed. Overdosage: If you think you have taken too much of this medicine contact a poison control center or emergency room at once. NOTE: This medicine is only for you. Do not share this medicine with others. What if I miss a dose? If you miss a dose, take it as soon as you can. If it is almost time for your next dose, take only that dose. Do not take double or extra doses. What may interact with this medicine? Do not take this medicine with any of the following medications: -certain medicines  for fungal infections like fluconazole, itraconazole, ketoconazole, posaconazole, voriconazole -cisapride -dofetilide -dronedarone -linezolid -MAOIs like Carbex, Eldepryl, Marplan, Nardil, and Parnate -mesoridazine -methylene blue (injected into a vein) -pimozide -saquinavir -thioridazine -ziprasidone This medicine may also interact with the following medications: -alcohol -antiviral medicines for HIV or AIDS -aspirin and aspirin-like medicines -barbiturates like phenobarbital -certain medicines for blood pressure, heart disease, irregular heart beat -certain medicines for depression, anxiety, or psychotic disturbances -certain medicines for migraine headache like almotriptan, eletriptan, frovatriptan, naratriptan, rizatriptan, sumatriptan, zolmitriptan -certain medicines for seizures like carbamazepine and phenytoin -certain medicines for sleep -certain medicines that treat or prevent blood clots like dalteparin, enoxaparin, warfarin -digoxin -fentanyl -lithium -NSAIDS, medicines for pain and inflammation, like ibuprofen or naproxen -other medicines that prolong the QT interval (cause an abnormal heart rhythm) -rasagiline -supplements like St. John's wort, kava kava, valerian -tramadol -tryptophan This list may not describe all possible interactions. Give your health care provider a list of all the medicines, herbs, non-prescription drugs, or dietary supplements you use. Also tell them if you smoke, drink alcohol, or use illegal drugs. Some items may interact with your medicine. What should I watch for while using this medicine? Tell your doctor if your symptoms do not get better or if they get worse. Visit your doctor or health care professional for regular checks on your progress. Because it may take several weeks to see the full effects of this medicine, it is important to continue your treatment as prescribed by your doctor. Patients and their families should watch out for new  or worsening thoughts of suicide or depression. Also   watch out for sudden changes in feelings such as feeling anxious, agitated, panicky, irritable, hostile, aggressive, impulsive, severely restless, overly excited and hyperactive, or not being able to sleep. If this happens, especially at the beginning of treatment or after a change in dose, call your health care professional. You may get drowsy or dizzy. Do not drive, use machinery, or do anything that needs mental alertness until you know how this medicine affects you. Do not stand or sit up quickly, especially if you are an older patient. This reduces the risk of dizzy or fainting spells. Alcohol may interfere with the effect of this medicine. Avoid alcoholic drinks. This medicine may cause dry eyes and blurred vision. If you wear contact lenses you may feel some discomfort. Lubricating drops may help. See your eye doctor if the problem does not go away or is severe. Your mouth may get dry. Chewing sugarless gum, sucking hard candy and drinking plenty of water may help. Contact your doctor if the problem does not go away or is severe. What side effects may I notice from receiving this medicine? Side effects that you should report to your doctor or health care professional as soon as possible: -allergic reactions like skin rash, itching or hives, swelling of the face, lips, or tongue -elevated mood, decreased need for sleep, racing thoughts, impulsive behavior -confusion -fast, irregular heartbeat -feeling faint or lightheaded, falls -feeling agitated, angry, or irritable -loss of balance or coordination -painful or prolonged erections -restlessness, pacing, inability to keep still -suicidal thoughts or other mood changes -tremors -trouble sleeping -seizures -unusual bleeding or bruising Side effects that usually do not require medical attention (report to your doctor or health care professional if they continue or are bothersome): -change in  sex drive or performance -change in appetite or weight -constipation -headache -muscle aches or pains -nausea This list may not describe all possible side effects. Call your doctor for medical advice about side effects. You may report side effects to FDA at 1-800-FDA-1088. Where should I keep my medicine? Keep out of the reach of children. Store at room temperature between 15 and 30 degrees C (59 to 86 degrees F). Protect from light. Keep container tightly closed. Throw away any unused medicine after the expiration date. NOTE: This sheet is a summary. It may not cover all possible information. If you have questions about this medicine, talk to your doctor, pharmacist, or health care provider.  2017 Elsevier/Gold Standard (2016-04-25 16:57:05)  

## 2017-01-02 NOTE — Progress Notes (Signed)
Annual Screening Comprehensive Examination   This very nice 31 y.o.female presents for complete physical.  She is currently married to [employer].  She has a son who is approximately 36 months old.  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    She has a history of seizures as a child.  She reports that she was on epileptic medications which she was taken off of when she was 15.    She was previously on zoloft on the past for anxiety.  She reports that she didn't like the side effects of the medications and she had to come off of it due to her pregnancy.  She reports that she has been having worsening ability to sleep and she felt like she was starting to have some issues with panic attacks.  She reports that she feels like she can't get her brain to shut off.  She has taken clonazepam in the past and she has also taken ambien in the past to help with her with sleeping.  She is not sure what she wants to do.  She has trouble with the ambien due to feeling like she is drunk in the morning. She would like to see a neurologist.  She reports that she is anxious and worried but sometimes she is not worried at all.  She has been working out to try to make herself more fatigued.  She does sometimes get a sense of impending doom.       Current Outpatient Prescriptions on File Prior to Visit  Medication Sig Dispense Refill  . calcium carbonate (TUMS - DOSED IN MG ELEMENTAL CALCIUM) 500 MG chewable tablet Chew 1-2 tablets by mouth 2 (two) times daily as needed for indigestion or heartburn.    Marland Kitchen CALCIUM PO Take 1 tablet by mouth daily.     . coconut oil OIL Apply 1 application topically as needed. 25 mL 0  . Docosahexaenoic Acid (DHA OMEGA 3 PO) Take 1 capsule by mouth 2 (two) times daily.    Marland Kitchen ibuprofen (ADVIL,MOTRIN) 800 MG tablet Take 1 tablet (800 mg total) by mouth every 8 (eight) hours. 30 tablet 1  . Prenatal Vit-Fe Fumarate-FA  (PRENATAL MULTIVITAMIN) TABS tablet Take 1 tablet by mouth daily at 12 noon.     No current facility-administered medications on file prior to visit.     Allergies  Allergen Reactions  . Prednisone Anxiety and Other (See Comments)    Patient states it "makes her psycho."    Past Medical History:  Diagnosis Date  . Anxiety   . History of fainting   . Panic attacks   . Seizures (HCC)     There is no immunization history for the selected administration types on file for this patient.  Past Surgical History:  Procedure Laterality Date  . SHOULDER SURGERY Right   . TONSILLECTOMY    . TONSILLECTOMY    . WISDOM TOOTH EXTRACTION      Family History  Problem Relation Age of Onset  . Hypertension Mother     Social History   Social History  . Marital status: Married    Spouse name: N/A  . Number of children: N/A  . Years of education: N/A   Occupational History  . Not on file.   Social History Main Topics  . Smoking status: Never Smoker  . Smokeless tobacco: Never Used  . Alcohol use Yes  Comment: occ not with preg  . Drug use: No  . Sexual activity: Yes    Birth control/ protection: None, Condom   Other Topics Concern  . Not on file   Social History Narrative  . No narrative on file   Review of Systems  Constitutional: Negative for chills, fever and malaise/fatigue.  HENT: Positive for congestion. Negative for ear discharge, hearing loss, sinus pain and tinnitus.   Eyes: Negative.   Respiratory: Positive for cough. Negative for hemoptysis, sputum production, shortness of breath, wheezing and stridor.   Cardiovascular: Positive for palpitations. Negative for chest pain, orthopnea, claudication, leg swelling and PND.  Gastrointestinal: Negative.   Genitourinary: Negative.   Musculoskeletal: Negative.   Neurological: Negative for dizziness, sensory change and loss of consciousness.  Psychiatric/Behavioral: Negative for depression, hallucinations, memory  loss, substance abuse and suicidal ideas. The patient is nervous/anxious and has insomnia.       Physical Exam  BP 118/68   Pulse 76   Temp 98.4 F (36.9 C) (Temporal)   Resp 18   Ht 5' 1.5" (1.562 m)   Wt 156 lb (70.8 kg)   LMP 12/16/2016 (Approximate)   BMI 29.00 kg/m   General Appearance: Well nourished and in no apparent distress. Eyes: PERRLA, EOMs, conjunctiva no swelling or erythema, normal fundi and vessels. Sinuses: No frontal/maxillary tenderness ENT/Mouth: EACs patent / TMs  nl. Nares clear without erythema, swelling, mucoid exudates. Oral hygiene is good. No erythema, swelling, or exudate. Tongue normal, non-obstructing. Tonsils not swollen or erythematous. Hearing normal.  Neck: Supple, thyroid normal. No bruits, nodes or JVD. Respiratory: Respiratory effort normal.  BS equal and clear bilateral without rales, rhonci, wheezing or stridor. Cardio: Heart sounds are normal with regular rate and rhythm and no murmurs, rubs or gallops. Peripheral pulses are normal and equal bilaterally without edema. No aortic or femoral bruits. Chest: symmetric with normal excursions and percussion. Breasts: Deferred  Abdomen: Flat, soft, with bowl sounds. Nontender, no guarding, rebound, hernias, masses, or organomegaly.  Lymphatics: Non tender without lymphadenopathy.  Genitourinary: Deferred to obgyn Musculoskeletal: Full ROM all peripheral extremities, joint stability, 5/5 strength, and normal gait. Skin: Warm and dry without rashes, lesions, cyanosis, clubbing or  ecchymosis.  Neuro: Cranial nerves intact, reflexes equal bilaterally. Normal muscle tone, no cerebellar symptoms. Sensation intact.  Pysch: Awake and oriented X 3, pressured speech, anxious appearing, appropriate insight Assessment and Plan    1. Chronic insomnia -likely coming from generalized anxiety vs. Hypomanic state.  -try trazodone  - Ambulatory referral to Neurology  2. Routine general medical examination at  a health care facility -due next year  3. Screening for hypercholesterolemia  - Lipid panel  4. Screening for diabetes mellitus  - Hemoglobin A1c - Insulin, random  5. Screening for deficiency anemia  - Iron and TIBC - Vitamin B12  6. Screening for hematuria or proteinuria  - Urinalysis, Routine w reflex microscopic - Microalbumin / creatinine urine ratio  7. Vitamin D deficiency -start Vit D - VITAMIN D 25 Hydroxy (Vit-D Deficiency, Fractures)  8. Screening for thyroid disorder  - TSH  9. Medication management  - CBC with Differential/Platelet - BASIC METABOLIC PANEL WITH GFR - Hepatic function panel - Magnesium    Continue prudent diet as discussed, weight control, regular exercise, and medications. Routine screening labs and tests as requested with regular follow-up as recommended.  Over 40 minutes of exam, counseling, chart review and critical decision making was performed

## 2017-01-03 ENCOUNTER — Other Ambulatory Visit: Payer: Self-pay | Admitting: Internal Medicine

## 2017-01-03 LAB — BASIC METABOLIC PANEL WITH GFR
BUN: 13 mg/dL (ref 7–25)
CO2: 24 mmol/L (ref 20–31)
Calcium: 9.5 mg/dL (ref 8.6–10.2)
Chloride: 102 mmol/L (ref 98–110)
Creat: 0.77 mg/dL (ref 0.50–1.10)
GFR, Est African American: >89 mL/min (ref >=60)
GFR, Est Non African American: >89 mL/min (ref >=60)
Glucose, Bld: 92 mg/dL (ref 65–99)
Potassium: 4 mmol/L (ref 3.5–5.3)
Sodium: 138 mmol/L (ref 135–146)

## 2017-01-03 LAB — URINALYSIS, ROUTINE W REFLEX MICROSCOPIC
Bilirubin Urine: NEGATIVE
Glucose, UA: NEGATIVE
Ketones, ur: NEGATIVE
Leukocytes, UA: NEGATIVE
Nitrite: NEGATIVE
Protein, ur: NEGATIVE
Specific Gravity, Urine: 1.009 (ref 1.001–1.035)
pH: 6 (ref 5.0–8.0)

## 2017-01-03 LAB — MICROALBUMIN / CREATININE URINE RATIO
Creatinine, Urine: 51 mg/dL (ref 20–320)
Microalb Creat Ratio: 14 mcg/mg creat (ref <30)
Microalb, Ur: 0.7 mg/dL

## 2017-01-03 LAB — HEPATIC FUNCTION PANEL
ALT: 15 U/L (ref 6–29)
AST: 17 U/L (ref 10–30)
Albumin: 4.2 g/dL (ref 3.6–5.1)
Alkaline Phosphatase: 52 U/L (ref 33–115)
Bilirubin, Direct: 0.1 mg/dL (ref <=0.2)
Indirect Bilirubin: 0.3 mg/dL (ref 0.2–1.2)
Total Bilirubin: 0.4 mg/dL (ref 0.2–1.2)
Total Protein: 7.3 g/dL (ref 6.1–8.1)

## 2017-01-03 LAB — LIPID PANEL
Cholesterol: 195 mg/dL (ref <200)
HDL: 47 mg/dL — ABNORMAL LOW (ref >50)
LDL Cholesterol: 117 mg/dL — ABNORMAL HIGH (ref <100)
Total CHOL/HDL Ratio: 4.1 Ratio (ref <5.0)
Triglycerides: 153 mg/dL — ABNORMAL HIGH (ref <150)
VLDL: 31 mg/dL — ABNORMAL HIGH (ref <30)

## 2017-01-03 LAB — VITAMIN D 25 HYDROXY (VIT D DEFICIENCY, FRACTURES): Vit D, 25-Hydroxy: 26 ng/mL — ABNORMAL LOW (ref 30–100)

## 2017-01-03 LAB — URINALYSIS, MICROSCOPIC ONLY
Bacteria, UA: NONE SEEN [HPF]
Casts: NONE SEEN [LPF]
Crystals: NONE SEEN [HPF]
RBC / HPF: NONE SEEN RBC/HPF (ref <=2)
Yeast: NONE SEEN [HPF]

## 2017-01-03 LAB — IRON AND TIBC
%SAT: 21 % (ref 11–50)
Iron: 73 ug/dL (ref 40–190)
TIBC: 350 ug/dL (ref 250–450)
UIBC: 277 ug/dL (ref 125–400)

## 2017-01-03 LAB — HEMOGLOBIN A1C
Hgb A1c MFr Bld: 5 % (ref <5.7)
Mean Plasma Glucose: 97 mg/dL

## 2017-01-03 LAB — MAGNESIUM: Magnesium: 2.1 mg/dL (ref 1.5–2.5)

## 2017-01-03 LAB — VITAMIN B12: Vitamin B-12: 750 pg/mL (ref 200–1100)

## 2017-01-03 LAB — INSULIN, RANDOM: Insulin: 19.5 u[IU]/mL (ref 2.0–19.6)

## 2017-01-03 MED ORDER — SERTRALINE HCL 50 MG PO TABS: 50.0000 mg | ORAL_TABLET | Freq: Every day | ORAL | 2 refills | Status: DC

## 2017-01-03 NOTE — Progress Notes (Signed)
Called patient regarding medication reaction as reported to my CMA Lurena JoinerRebecca.  Patient's husband reported that she took trazodone at 11 oclock.  Her husband reported that at 4212 oclock her husband noted that she was shaking and was having some hyperventilation and then she fell on the floor.  He reports that he put her in the resting position.  He reports that she was still dizzy today.  She refused to go to the hospital.  We will restart zoloft.  Her husband states that she was much improved on this medication and only came off of it for her pregnancy.  He would like to see her back on it.  Will restart 8 days of 25 mg zoloft.  If no side effects will increase to 50 mg daily of zoloft if agreeable.

## 2017-01-08 ENCOUNTER — Institutional Professional Consult (permissible substitution): Payer: Managed Care, Other (non HMO) | Admitting: Neurology

## 2017-01-09 ENCOUNTER — Other Ambulatory Visit: Payer: Self-pay | Admitting: Internal Medicine

## 2017-01-09 ENCOUNTER — Telehealth: Payer: Self-pay | Admitting: *Deleted

## 2017-01-09 MED ORDER — ATENOLOL 25 MG PO TABS: 25.0000 mg | ORAL_TABLET | Freq: Every day | ORAL | 3 refills | Status: DC

## 2017-01-09 NOTE — Telephone Encounter (Signed)
Patient called with c/o heart palp. And concerned that her Zoloft Rx is keeping her awake.  Per Terri Piedraourtney Forcucci, PA-C orders, patient is most likely experiencing increased anxiety causing her symptoms.  Patient recently restarted her Zoloft Rx and states it has worked well for her symptoms in the past.  Per Courtney's orders, Atenolol 25 mg sent into pharmacy and advised patient to continue her Zoloft as well as her Xanax and to take her Atenolol at night to help calm her down and to help relieve her symptoms of heart palpitations. Advised patient to call if no relief from symptoms or if any symptoms increase.  Patient expressed understanding.

## 2017-01-15 ENCOUNTER — Encounter: Payer: Self-pay | Admitting: Neurology

## 2017-01-15 ENCOUNTER — Ambulatory Visit (INDEPENDENT_AMBULATORY_CARE_PROVIDER_SITE_OTHER): Payer: 59 | Admitting: Neurology

## 2017-01-15 VITALS — BP 122/78 | HR 80 | Resp 18 | Ht 61.5 in | Wt 155.0 lb

## 2017-01-15 DIAGNOSIS — Z72820 Sleep deprivation: Secondary | ICD-10-CM | POA: Diagnosis not present

## 2017-01-15 DIAGNOSIS — R569 Unspecified convulsions: Secondary | ICD-10-CM

## 2017-01-15 DIAGNOSIS — R002 Palpitations: Secondary | ICD-10-CM | POA: Diagnosis not present

## 2017-01-15 DIAGNOSIS — F409 Phobic anxiety disorder, unspecified: Secondary | ICD-10-CM

## 2017-01-15 DIAGNOSIS — F5105 Insomnia due to other mental disorder: Secondary | ICD-10-CM

## 2017-01-15 MED ORDER — PROPRANOLOL HCL 20 MG PO TABS: 20.0000 mg | ORAL_TABLET | Freq: Every evening | ORAL | 1 refills | Status: DC | PRN

## 2017-01-15 NOTE — Progress Notes (Signed)
SLEEP MEDICINE CLINIC   Provider:  Melvyn Novas, M D  Referring Provider: Lucky Cowboy, MD Primary Care Physician:  Nadean Corwin, MD  Chief Complaint  Patient presents with  . Sleep Consult    Rm 10. Patient states that she had a sleep study as a child. Hx of seizures. Patient has trouble falling and staying asleep, Patient reports that she has been on Zoloft for many years, came off when she was pregnant, restart zoloft and now having trouble with fatigue etc.     HPI:  Julia Hansen is a 31 y.o. female , seen here as a referral  from Dr. Oneta Rack for a sleep consultation,  Chief complaint according to patient : "sleep - I need It "  Julia Hansen reports that she had always trouble sleeping and that it was attributed to sleep deprivation that she suffered 2 seizures at age 61, was placed on Tegretol until she was 31 years old. She was placed on Zoloft later in her teenage years after she became worried about her tremor, which her physicians attributed to anxiety or panic attacks. She continued to take Zoloft until she got pregnant at age 66. She is married and this was a planned pregnancy. After giving birth to a healthy baby boy she developed panic attacks, she stopped sleeping, her baby would wake her but she was unable to initiate sleep or re-initiate sleep eventually after about 4 months, panic attacks that in about 6 weeks ago- at around Christmas, At the time she was not on medication, was breast-feeding but the continued sleep deprivation culminated the and in panic attacks. She was just recently put back on Zoloft after she complained about constant hypervigilance the inability to relax to sleep or take her mind off. She feels that she cannot function in her current state of sleep deprivation. She has taken Xanax and could still not initiate sleep. She just restarted Zoloft at 25 mg only and states that her mind is foggy she feels out of control very irritable  impulsive unable to sit still, her eyes hurt she feels trembles, palpitations - she was on this medication for over10 years before she became pregnant! Her baby is now 41-month-old. 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 Perhaps she feels undeserving.  Sleep habits are as follows: She has always needed earplugs, a dark room. She travelled as a tennis pro, and couldn't sleep in hotel rooms. She feels that she is a very light sleeper and that her sleep is interrupted by any sound. This has been worsened since the birth of her baby now she is very hypervigilant and it has left to sleep deprivation. She has seen a psychiatrist, Dr. @ [facility]. She feels ZOLOFT is making her worse.   Social history:  Married for 3 year, new mo, tennis pro,  No tobacco use, ETOH seldomly, caffeine- stopped all together.   Review of Systems: Out of a complete 14 system review, the patient complains of only the following symptoms, and all other reviewed systems are negative. Chronic insomnia pretty much since childhood. Always a very light sleeper, always the feeling that she didn't sleep enough, always a tendency to have fragmented sleep or being easily aroused area did very sensitive to stimuli. Before she had a baby she already  had spinning sensations, palpitations she did much better with Zoloft at the time and earplugs. He would sometimes take a Xanax and had no problems tolerating it noted chiefly groggy. She had never tried another antidepressant. Now she has insomnia, anxiety, tremors, the feeling of not getting enough sleep and decreased level of daytime energy mood changes racing thoughts, death interest in activities, spinning sensation-vertigo, diarrhea, feeling hot, shortness of breath, palpitations.   All these came when she restarted  Zoloft after the birth of her baby  Epworth score  0, Fatigue severity score 58  , depression score n/a   Social History   Social History  . Marital status: Married    Spouse name: N/A  . Number of children: 1  . Years of education: BA   Occupational History  . Not on file.   Social History Main Topics  . Smoking status: Never Smoker  . Smokeless tobacco: Never Used  . Alcohol use Yes     Comment: occ not with preg  . Drug use: No  . Sexual activity: Yes    Birth control/ protection: None, Condom   Other Topics Concern  . Not on file   Social History Narrative   Denies using caffeine     Family History  Problem Relation Age of Onset  . Hypertension Mother     Past Medical History:  Diagnosis Date  . Anxiety   . History of fainting   . Panic attacks   . 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     Past Surgical History:  Procedure Laterality Date  . SHOULDER SURGERY Right   . TONSILLECTOMY    . TONSILLECTOMY    . WISDOM TOOTH EXTRACTION      Current Outpatient Prescriptions  Medication Sig Dispense Refill  . ALPRAZolam (XANAX) 0.5 MG tablet Take 0.5 mg by mouth at bedtime as needed for anxiety.    Marland Kitchen atenolol (TENORMIN) 25 MG tablet Take 1 tablet (25 mg total) by mouth at bedtime. 30 tablet 3  . calcium carbonate (TUMS - DOSED IN MG ELEMENTAL CALCIUM) 500 MG chewable tablet Chew 1-2 tablets by mouth 2 (two) times daily as needed for indigestion or heartburn.    Marland Kitchen CALCIUM PO Take 1 tablet by mouth daily.     . coconut oil OIL Apply 1 application topically as needed. 25 mL 0  . Docosahexaenoic Acid (DHA OMEGA 3 PO) Take 1 capsule by mouth 2 (two) times daily.    . fluticasone (FLONASE) 50 MCG/ACT nasal spray Place 2 sprays into both nostrils daily. 16 g 3  . ibuprofen (ADVIL,MOTRIN) 800 MG tablet Take 1 tablet (800 mg total) by mouth every 8 (eight) hours. 30 tablet 1  . Prenatal Vit-Fe Fumarate-FA (PRENATAL MULTIVITAMIN) TABS  tablet Take 1 tablet by mouth daily at 12 noon.    . sertraline (ZOLOFT) 50 MG tablet Take 1 tablet (50 mg total) by mouth daily. 30 tablet 2  . zolpidem (AMBIEN) 5 MG tablet Take 5 mg by mouth at bedtime as needed for sleep.     No current facility-administered medications for this visit.     Allergies as of 01/15/2017 - Review Complete 01/15/2017  Allergen Reaction Noted  . Trazodone and nefazodone  01/03/2017  . Prednisone Anxiety and Other (See Comments) 06/21/2016    Vitals: BP 122/78   Pulse 80   Resp 18   Ht 5' 1.5" (1.562 m)   Wt 155 lb (70.3 kg)  LMP 12/16/2016 (Approximate)   BMI 28.81 kg/m  Last Weight:  Wt Readings from Last 1 Encounters:  01/15/17 155 lb (70.3 kg)   ZOX:WRUE mass index is 28.81 kg/m.     Last Height:   Ht Readings from Last 1 Encounters:  01/15/17 5' 1.5" (1.562 m)    Physical exam:  General: The patient is awake, alert and appears not in acute distress. The patient is well groomed. Head: Normocephalic, atraumatic. Neck is supple. Mallampati 3,  neck circumference:15.5 Cardiovascular:  Regular rate and rhythm, without  murmurs or carotid bruit, and without distended neck veins. Respiratory: Lungs are clear to auscultation. Skin:  Without evidence of edema, or rash Trunk: BMI is 29. The patient's posture is erect  Neurologic exam : The patient is awake and alert, oriented to place and time.   Memory subjective  described as intact.  Attention span & concentration ability appears normal.  Speech is pressured - non stop-  without  dysarthria, dysphonia or aphasia.  Mood and affect are appropriate.  Cranial nerves: Pupils are equal and briskly reactive to light. Funduscopic exam without  evidence of pallor or edema. Extraocular movements  in vertical and horizontal planes intact and without nystagmus. Visual fields by finger perimetry are intact. Hearing to finger rub intact.   Facial sensation intact to fine touch.  Facial motor strength  is symmetric and tongue and uvula move midline. Shoulder shrug was symmetrical.   Motor exam:   Normal tone, muscle bulk and symmetric strength in all extremities. Gait and station: Patient walks without assistive device and is able unassisted to climb up to the exam table. Strength within normal limits.  Stance is stable and normal.    Deep tendon reflexes: in the  upper and lower extremities are symmetric and intact. Babinski maneuver response is downgoing.  The patient was advised of the nature of the diagnosed sleep disorder , the treatment options and risks for general a health and wellness arising from not treating the condition.  I spent more than 45  minutes of face to face time with the patient. Greater than 50% of time was spent in counseling and coordination of care. We have discussed the diagnosis and differential and I answered the patient's questions.     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 She has struggled with insomnia for years but it was always treated without side effects when she took Zoloft. She would occasionally need the Xanax, but she never had the need to take any additional medication on a daily basis. She has also never been on other antidepressants except for trial of trazodone which was designated for sleep aid. The trazodone side effects were bad. She passed out. She is currently taking Zoloft and Xanax Zoloft at 25 mg.  She was given beta blockers for palpitations in the setting of panic attacks. Ambien is considered safe at 5 mg and could be combined with Zoloft but not with Xanax.  She revealed she had not taken the beta-blockers, which are safe in  pregnancy and while nursing.    2) no snoring, no apnea, 1-2 nocturia, sleep related morning headaches.      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  Post natal exacerbation. Racing thoughts.   I would definitely recommend to start with a beta blocker at nighttime, I would recommend propranolol. This will put his speech limited on her hard and she will not rise from sleep with palpitations. Beta blockers but are not extended release will not affect her academic or athletic performance the following day . She is a normal resting heart rate. So I think this is a safe suggestion  #2 Zoloft was just started but she seems to have side effects that she didn't experience in the over decade long treatment with substance before. I need her to take it for about 3 weeks before it can be increased to 50 mg. I do think this will make her sleep much better. #3 I want to rule out an organic sleep disorder. Palpitations, nocturnal diaphoresis, vivid dreams, anxiety and panic attacks in context with extreme fatigue are suspicious for cardiac arrhythmia, never had a marked monitor before I like to undergo a PSG with additional EEG channels.   She should not wean off any medications in order to have the sleep study.  She is permitted to take either the Ambien or Xanax at night if she cannot find sleep. She was warned not to mix these medications with him the same. She should take Zoloft in the morning.    Porfirio Mylararmen Nara Paternoster MD  01/15/2017   CC: Lucky CowboyWilliam Mckeown, Md [address] [place], [place] [id number]

## 2017-01-15 NOTE — Addendum Note (Signed)
Addended by: Melvyn NovasHMEIER, Abeer Deskins on: 01/15/2017 04:05 PM   Modules accepted: Orders

## 2017-01-15 NOTE — Patient Instructions (Signed)

## 2017-01-16 ENCOUNTER — Telehealth: Payer: Self-pay | Admitting: Neurology

## 2017-01-16 NOTE — Telephone Encounter (Signed)
Patient called in reference to taking a Beta-Blocker with medication at bed time last night.  Patient states it relaxed her but would not let her mind stop, and patient states she will not continue the Beta-Blocker.  Please call

## 2017-01-17 NOTE — Telephone Encounter (Signed)
Dr Dohmeier- please advise 

## 2017-01-17 NOTE — Telephone Encounter (Signed)
I explained to her :The beta blocker treats palpitation, heart racing - not anxiety, depression or being worried. The zoloft side effects will get less and less, tremor, eye twitching will be gone by 3 weeks intake time. Only then will I increase Zoloft to 50 mg. Patient understood and will keep me posted next week.  CD

## 2017-01-20 ENCOUNTER — Telehealth: Payer: Self-pay | Admitting: Neurology

## 2017-01-20 NOTE — Telephone Encounter (Signed)
I spoke to her on Friday and she had at leas one night of sleep.  Her ZOLOFT dose will be increased soon.  Please keep a sleep diary.

## 2017-01-20 NOTE — Telephone Encounter (Signed)
Patient tried ALPRAZolam (XANAX) 0.5 MG tablet. First night she slept but last night was up all night and today she feels disfunctional.

## 2017-01-21 NOTE — Telephone Encounter (Signed)
I called and left pt a msg. I stated that we will mail her a sleep diary to keep until we see her back. Directions are on the diary. I stated that at this time, no recommended medication changes.  Left call back number for further questions or concerns.

## 2017-01-28 ENCOUNTER — Ambulatory Visit (INDEPENDENT_AMBULATORY_CARE_PROVIDER_SITE_OTHER): Payer: 59 | Admitting: Neurology

## 2017-01-28 DIAGNOSIS — G4710 Hypersomnia, unspecified: Secondary | ICD-10-CM | POA: Diagnosis not present

## 2017-01-28 DIAGNOSIS — R002 Palpitations: Secondary | ICD-10-CM

## 2017-01-28 DIAGNOSIS — F5105 Insomnia due to other mental disorder: Secondary | ICD-10-CM

## 2017-01-28 DIAGNOSIS — Z72820 Sleep deprivation: Secondary | ICD-10-CM

## 2017-01-28 DIAGNOSIS — F409 Phobic anxiety disorder, unspecified: Secondary | ICD-10-CM

## 2017-01-29 ENCOUNTER — Institutional Professional Consult (permissible substitution): Payer: Managed Care, Other (non HMO) | Admitting: Neurology

## 2017-01-30 ENCOUNTER — Telehealth: Payer: Self-pay | Admitting: Neurology

## 2017-01-30 NOTE — Telephone Encounter (Signed)
I am reading it right now. CD

## 2017-01-30 NOTE — Telephone Encounter (Signed)
Julia Hansen , please call tonight.  She is doing much better, falls asleep late but was able to stay asleep !  Has to stay on SSRI.

## 2017-01-30 NOTE — Telephone Encounter (Signed)
Patient called office in reference to see about sleep study results, pt advised of study results turn around time.  Patient is concerned due to her job.  Please call

## 2017-01-30 NOTE — Procedures (Signed)
PATIENT'S NAME:  Julia Hansen, Julia Hansen DOB:      01/16/1986      MR#:    [id number]     DATE OF RECORDING: 01/28/2017 REFERRING M.D.:  Lucky CowboyWilliam McKeown, MD Study Performed:   Baseline Polysomnogram HISTORY:  This 31 y.o. female patient presents with severe insomnia, trouble sleeping . after giving birth to a healthy baby boy.  She developed panic attacks, she stopped sleeping through the night, she was unable to initiate sleep or re-initiate sleep. Eventually, after about 4 months, she developed panic attacks , first onset 8 weeks ago.  She feels that she is a very light sleeper and that her sleep is interrupted by any sound. This has been worsened since the birth of her baby now she is hypervigilant and suffers from sleep deprivation.  The patient endorsed the Epworth Sleepiness Scale at 0/24 points.   The patient's weight 155 pounds with a height of 61.5 (inches), resulting in a BMI of 29.1 kg/m2. The patient's neck circumference measured 15.5 inches.  CURRENT MEDICATIONS: Xanax, Tenormin, Tums, Calcium, Coconut Oil, Omega 3, Flonase, Ibuprofen, Prenatal vitamin, Zoloft, Ambien   PROCEDURE:  This is a multichannel digital polysomnogram utilizing the Somnostar 11.2 system.  Electrodes and sensors were applied and monitored per AASM Specifications.   EEG, EOG, Chin and Limb EMG, were sampled at 200 Hz.  ECG, Snore and Nasal Pressure, Thermal Airflow, Respiratory Effort, CPAP Flow and Pressure, Oximetry was sampled at 50 Hz. Digital video and audio were recorded.      BASELINE STUDY  Lights Out was at 21:43 and Lights On at 05:31.  Total recording time (TRT) was 468.5 minutes, with a total sleep time (TST) of 288 minutes.   The patient's sleep latency was 70.5 minutes.  REM latency was 316.5 minutes.  Sleep efficiency was recorded at 61.5 %.     SLEEP ARCHITECTURE: WASO (Wake after sleep onset) was 118 minutes.  There were 7 minutes in Stage N1, 227 minutes Stage N2, 41.5 minutes Stage N3 and 12.5 minutes  in Stage REM.  The percentage of Stage N1 was 2.4%, Stage N2 was 78.8%, Stage N3 was 14.4% and Stage R (REM sleep) was 4.3%.   RESPIRATORY ANALYSIS:  There were a total of 0 respiratory events:  0 obstructive apneas, 0 central apneas and 0 mixed apneas with a total of 0 apneas and an apnea index (AI) of 0 /hour. There were 0 hypopneas with a hypopnea index of 0 /hour. The patient also had 0 respiratory event related arousals (RERAs). The total APNEA/HYPOPNEA INDEX (AHI) and the total RESPIRATORY DISTURBANCE INDEX were 0.0 /hour.  The patient spent 223.5 minutes of total sleep time in the supine position and 65 minutes in non-supine. OXYGEN SATURATION & C02:  The Wake baseline 02 saturation was 98%, with the lowest being 84%. Time spent below 89% saturation equaled 0 minutes. PERIODIC LIMB MOVEMENTS:  The patient had a total of 0 Periodic Limb Movements.   The arousals were noted as: 59 were spontaneous, 0 were associated with PLMs, and 0 were associated with respiratory events.  Audio and video analysis did not show any abnormal or unusual movements, behaviors, phonations or vocalizations. The patient took bathroom breaks. Snoring was noted. EKG was in keeping with normal sinus rhythm (NSR). Post-study, the patient indicated that sleep was the same as usual.    IMPRESSION: Delayed sleep onset and delayed REM onset , but Patient was able to asleep 288 minutes,  No organic sleep disorder was  identified, normal EEG, EKG, Oxygen saturation and breathing pattern. No periodic limb movements , neither parasomnia activity.   RECOMMENDATION: Continue SSRI, and increase the medication dose. Allow for Xanax prn during time of anxiety and stress, preventing panic attacks. Postnatal depression evaluation by mental health specialist.       Julia Novas, MD 01-30-2017 Diplomat, American Board of Psychiatry and Neurology  Diplomat, American Board of Sleep Medicine Medical Director, [place] [facility] at  [facility]

## 2017-01-31 NOTE — Telephone Encounter (Signed)
-----   Message from Melvyn Novasarmen Dohmeier, MD sent at 01/30/2017  5:05 PM EST ----- IMPRESSION: Delayed sleep onset and delayed REM onset confirm Insomnia complaint , but Patient was able to sleep 288 minutes, efficiency of over 60 %.  No organic sleep disorder was identified, as we noted normal EEG, EKG, Oxygen saturation and breathing pattern. No periodic limb movements were seen , neither parasomnia activity.   RECOMMENDATION: Continue SSRI, and increase the medication dose gradually. Allow for Xanax prn during time of anxiety and stress, preventing panic attacks. Postnatal depression evaluation by mental health specialist.

## 2017-01-31 NOTE — Telephone Encounter (Signed)
Pt called said the anxiety attacks have stopped since starting zoloft. She is doing well on it. However she is only sleeping between 2am - 4 am. Says in the past 2 mths the only night she slept all night was the night of the sleep study. Please call her at [phone]

## 2017-01-31 NOTE — Telephone Encounter (Signed)
I called Julia Hansen. I advised her that her sleep study results showed delayed sleep onset and delayed REM onset which does confirm Julia Hansen's insomnia complaint. However, Julia Hansen was able to sleep 288 minutes, efficiency of over 60 %. I advised Julia Hansen that no organic sleep disorder was identified, and her EEG, EKG, O2 sats, and breathing pattern were normal. No PLMS were seen and no parasomnia activity.  I advised her that Dr. Vickey Hugerohmeier recommends that she continue the SSRI and increase the medication gradually and use xanax prn when she has anxiety or stress to prevent panic attacks. She should be evaluated for postpartum depression by a mental health specialist.  Julia Hansen reports that the night of the sleep study has been the only night in the past 2 months that she has slept. She says that she is at the point of going to the ER to get a brain scan to find out what is wrong with her brain that is causing her not to sleep. Julia Hansen wants to know if she can have another sleep study, because she says that this one is not accurate. She says that since she actually slept during her sleep study, the study is inconclusive since we did not see what was causing her to not sleep. Julia Hansen says that she is at the point where she thinks she needs to quit her job because she is not sleeping.  Julia Hansen is insisting that Dr. Vickey Hugerohmeier call her back today, Friday, 01/31/2017. She asked me to verbally confirm with Dr. Vickey Hugerohmeier that she got this message and will call the Julia Hansen back to discuss treatment from here.

## 2017-01-31 NOTE — Telephone Encounter (Signed)
Pt can be reached at [phone], FYI.

## 2017-01-31 NOTE — Telephone Encounter (Signed)
I had a long and repetitive discussion with Julia Hansen. She told me that she has seen perinatal mood specialist a decision I find very very positive. This psychiatrist recommended for her to change from Zoloft to Lamictal and take Seroquel. But the choice of medication I assume that she also thinks that the patient is in acute manic phase.  One week has not filled either of the prescriptions yet. She stated the night in the sleep lab was wonderful but not representing a normal sleep pattern at home and I explained to her that we cannot repeated and that the disorder is not neurologically but a mood disorder. I strongly urged her to fill the Seroquel and take it at night she should think of it as a sleep aid. I also would like for her to follow this psychiatrist approach. Zoloft now has probably helped her to be more calm but Lamictal can help her not to have relapses between 2 phases of mania and depression. I do not think the patient fully grasped that she is likely bipolar depressed. She needs psychiatric follow up.

## 2017-01-31 NOTE — Telephone Encounter (Signed)
I spoke to Dr. Vickey Hugerohmeier and informed her of this message and the urgency.

## 2017-02-10 ENCOUNTER — Ambulatory Visit (INDEPENDENT_AMBULATORY_CARE_PROVIDER_SITE_OTHER): Payer: 59 | Admitting: Internal Medicine

## 2017-02-10 ENCOUNTER — Encounter: Payer: Self-pay | Admitting: Internal Medicine

## 2017-02-10 VITALS — BP 128/84 | HR 80 | Temp 98.2°F | Resp 18 | Ht 61.5 in

## 2017-02-10 DIAGNOSIS — R002 Palpitations: Secondary | ICD-10-CM | POA: Diagnosis not present

## 2017-02-10 DIAGNOSIS — F308 Other manic episodes: Secondary | ICD-10-CM

## 2017-02-10 MED ORDER — SERTRALINE HCL 50 MG PO TABS: 50.0000 mg | ORAL_TABLET | Freq: Every day | ORAL | 2 refills | Status: DC

## 2017-02-10 NOTE — Progress Notes (Signed)
Assessment and Plan:   1. Hypomania (HCC) -discussed with the patient the possibility that she is actually Bipolar which she refuses to believe.  I feel that her being on 25 mg of zoloft is not nearly enough to keep her level and agree that she can benefit dramatically from a mood stabilizer like lamictal.  I have explained to her that mood stabilizers are sometimes used as add on medications or "boosters" for antidepression medications.  She seemed slightly more interested if referred to them as this.  I do think that she is starting to flip out of mania at this time and is having a mild improvement.  We will continue to follow at this time.  She refuses further medications currently.  She does request a holter monitor for 48 hours to make sure that her heart is okay and that she does not have any issues prior to returning to intense exercise.  I have agreed.    2. Palpitations  - Holter monitor - 48 hour; Future    HPI 31 y.o.female presents for follow-up of hypomania and insomnia.  Since our last visit patient was seen by Dr. Vickey Hugerohmeier who saw her for her chronic insomnia and she had a normal sleep study.  Dr. Vickey Hugerohmeier agreed that the patient did have some hypomania and encouraged the patient to continue to take the zoloft.  She reports that she did try to go [facility] and she was told to take Lamictal and Seroquel.  She has been taking 25 mg.  She reports that she has been doing a lot better on the zoloft.  She has been running again.  She reports that she has been sleeping a lot more in the last week.  She is not having panic attacks at all.  She reports that she is still having some issues with palpitations.  She reports that she did try to take propranolol and it helped with her heart rate.  She reports that she did try cutting down the dose a little bit.  She has not been taking it for the last couple weeks.  She reports that she has been taking benadryl and also  melatonin.  She reports that she is much calmer.  She reports that she would like to be evaluated for palpitations.     Past Medical History:  Diagnosis Date  . Anxiety   . History of fainting   . Panic attacks   . 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      Allergies  Allergen Reactions  . Trazodone And Nefazodone     Patient had immediate dizziness and shortness of breath and "fell on the floor".  Husband had to call EMS not sure if it is a panic attack vs. Possible medication reaction.    . Prednisone Anxiety and Other (See Comments)    Patient states it "makes her psycho."      Current Outpatient Prescriptions on File Prior to Visit  Medication Sig Dispense Refill  . ALPRAZolam (XANAX) 0.5 MG tablet Take 0.5 mg by mouth at bedtime as needed for anxiety.    . calcium carbonate (TUMS - DOSED IN MG ELEMENTAL CALCIUM) 500 MG chewable tablet Chew 1-2 tablets by mouth 2 (two) times daily as needed for indigestion or heartburn.    Marland Kitchen. CALCIUM PO Take 1 tablet by mouth daily.     . coconut oil OIL Apply 1 application topically as needed. 25  mL 0  . Docosahexaenoic Acid (DHA OMEGA 3 PO) Take 1 capsule by mouth 2 (two) times daily.    . fluticasone (FLONASE) 50 MCG/ACT nasal spray Place 2 sprays into both nostrils daily. 16 g 3  . ibuprofen (ADVIL,MOTRIN) 800 MG tablet Take 1 tablet (800 mg total) by mouth every 8 (eight) hours. 30 tablet 1  . Prenatal Vit-Fe Fumarate-FA (PRENATAL MULTIVITAMIN) TABS tablet Take 1 tablet by mouth daily at 12 noon.    . sertraline (ZOLOFT) 50 MG tablet Take 1 tablet (50 mg total) by mouth daily. 30 tablet 2   No current facility-administered medications on file prior to visit.     ROS: all negative except above.   Physical Exam: There were no vitals filed for this visit. BP 128/84   Pulse 80   Temp 98.2 F (36.8 C) (Temporal)   Resp 18   Ht 5' 1.5" (1.562 m)  General Appearance: Well developed well nourished,  non-toxic appearing in no apparent distress. Eyes: PERRLA, EOMs, conjunctiva w/ no swelling or erythema or discharge Sinuses: No Frontal/maxillary tenderness ENT/Mouth: Ear canals clear without swelling or erythema.  TM's normal bilaterally with no retractions, bulging, or loss of landmarks.   Neck: Supple, thyroid normal, no notable JVD  Respiratory: Respiratory effort normal, Clear breath sounds anteriorly and posteriorly bilaterally without rales, rhonchi, wheezing or stridor. No retractions or accessory muscle usage. Cardio: RRR with no MRGs.   Abdomen: Soft, + BS.  Non tender, no guarding, rebound, hernias, masses.  Musculoskeletal: Full ROM, 5/5 strength, normal gait.  Skin: Warm, dry without rashes  Neuro: Awake and oriented X 3, Cranial nerves intact. Normal muscle tone, no cerebellar symptoms. Sensation intact.  Psych: Mildly pressured speech with fidgeting.  Minimal flight of ideas, normal affect, Insight and Judgment appropriate.     Terri Piedra, PA-C 5:13 PM [facility]

## 2017-02-18 ENCOUNTER — Ambulatory Visit: Payer: 59

## 2017-02-26 ENCOUNTER — Encounter (INDEPENDENT_AMBULATORY_CARE_PROVIDER_SITE_OTHER): Payer: 59

## 2017-02-26 DIAGNOSIS — R002 Palpitations: Secondary | ICD-10-CM

## 2017-04-08 ENCOUNTER — Encounter: Payer: Self-pay | Admitting: Obstetrics & Gynecology

## 2017-04-08 ENCOUNTER — Other Ambulatory Visit: Payer: Self-pay | Admitting: *Deleted

## 2017-04-08 ENCOUNTER — Ambulatory Visit (INDEPENDENT_AMBULATORY_CARE_PROVIDER_SITE_OTHER): Payer: 59 | Admitting: Obstetrics & Gynecology

## 2017-04-08 VITALS — BP 120/82

## 2017-04-08 DIAGNOSIS — R102 Pelvic and perineal pain: Secondary | ICD-10-CM

## 2017-04-08 NOTE — Patient Instructions (Signed)
You were bloated and tender on the left pelvic side on exam today.  We will complete the evaluation with a Pelvic US as soon as possible. Julia Hansen, it was a pleasure to see you today and get news from your adorable son!

## 2017-04-08 NOTE — Progress Notes (Signed)
    Julia Hansen Sep 20, 1986 [id number]        31 y.o.  G1P1001 married.  Son is 16 mth old, doing very well  RP:  Left pelvic pain x a few days   Well on Nuvaring, good compliance.  LLQ pain x a few days and feeling bloated.  Pain goes towards the hip, same as with a previous episode of ruptured ovarian cyst.  No vaginal d/c.  Finished treating a Cystitis, no UTI Sx currently.  Normal BMs, including this am.  No vomiting.  No dizziness or fainting.  No chills.    Past medical history,surgical history, problem list, medications, allergies, family history and social history were all reviewed and documented in the EPIC chart.  Directed ROS with pertinent positives and negatives documented in the history of present illness/assessment and plan.  Exam:  Vitals:   04/08/17 1510  BP: 120/82   General appearance:  Normal  Abdo:  Soft, no rigidity, no rebound.  Appears mildly bloated  Gyn exam:  Uterus AV, mobile, normal volume.  No adnexal mass felt, but tender on left side.  Assessment/Plan:  31 y.o. G1P1001   1. Female pelvic pain  Possible Left Ovarian Cyst.  Ruptured?  Ovarian torsion unlikely given no severe pain at anypoint.  Continue with Nuvaring.  - US Transvaginal Non-OB; Future, tomorrow am  Counseling >50% x 15 min on above issues. Genia Del MD, 3:14 PM 04/08/2017

## 2017-04-09 ENCOUNTER — Ambulatory Visit (INDEPENDENT_AMBULATORY_CARE_PROVIDER_SITE_OTHER): Payer: 59 | Admitting: Obstetrics & Gynecology

## 2017-04-09 ENCOUNTER — Ambulatory Visit (INDEPENDENT_AMBULATORY_CARE_PROVIDER_SITE_OTHER): Payer: 59

## 2017-04-09 DIAGNOSIS — R102 Pelvic and perineal pain: Secondary | ICD-10-CM | POA: Diagnosis not present

## 2017-04-09 NOTE — Progress Notes (Signed)
    Julia Hansen [birth year] [id number]        31 y.o.  G1P1001   RP:  Pelvic US for Left Pelvic Pain  Left pelvic pain improving, very mild now.  Past medical history,surgical history, problem list, medications, allergies, family history and social history were all reviewed and documented in the EPIC chart.  Directed ROS with pertinent positives and negatives documented in the history of present illness/assessment and plan.  Exam:  There were no vitals filed for this visit. General appearance:  Normal  Pelvic US today:  T/V Uterus anteverted, homogeneous, normal size.  Endometrial line tri-layered 5.7 mm.  Right and Left ovaries normal.  No adnexal mass.  No FF in CDS.  Assessment/Plan:  31 y.o. G1P1001   1. Female pelvic pain  Pelvic US as above, normal.  No Lt Ovarian cyst.  No adnexal mass.  No FF.  Patient reassured.  Will observe.  Counseling >50% x 15 min  Genia Del MD, 9:27 AM 04/09/2017

## 2017-04-09 NOTE — Patient Instructions (Signed)
Your Pelvic US was normal today.  No cyst on the Lt Ovary and no fluid in the pelvis.  Please follow-up for your Annual/Gyn exam or earlier if you need me.

## 2017-06-19 ENCOUNTER — Telehealth: Payer: Self-pay | Admitting: *Deleted

## 2017-06-19 NOTE — Telephone Encounter (Signed)
Pt called uses nuvaring for contraception would like to skip cycle, wanted to know if okay to use nuvaring continuously  x 2months (inserting a new ring on her week off) and then have a cycle on 3rd month? I told her I think that would be fine, wanted to confirm with you. Please advise

## 2017-06-19 NOTE — Telephone Encounter (Signed)
Continuous use is fine and would be as follows:  Insert a new Nuvaring when due.  Keep that ring in x 4 weeks.  At 4 weeks, remove the ring and immediately insert a new one.  Keep that ring in for 4 weeks and then switch.Marland Kitchen..Marland Kitchen

## 2017-06-19 NOTE — Telephone Encounter (Signed)
Pt informed

## 2017-07-07 ENCOUNTER — Encounter: Payer: Self-pay | Admitting: Internal Medicine

## 2017-07-16 ENCOUNTER — Ambulatory Visit (INDEPENDENT_AMBULATORY_CARE_PROVIDER_SITE_OTHER): Payer: 59 | Admitting: Obstetrics & Gynecology

## 2017-07-16 ENCOUNTER — Encounter: Payer: Self-pay | Admitting: Obstetrics & Gynecology

## 2017-07-16 VITALS — Ht 61.5 in | Wt 155.0 lb

## 2017-07-16 DIAGNOSIS — K6289 Other specified diseases of anus and rectum: Secondary | ICD-10-CM | POA: Diagnosis not present

## 2017-07-16 DIAGNOSIS — Z711 Person with feared health complaint in whom no diagnosis is made: Secondary | ICD-10-CM

## 2017-07-20 NOTE — Patient Instructions (Signed)
Perianal pain Normal perianal area and rectal exam.  No hemorrhoid, no fissure, no sign of inflammation.  Reassured.  [name], it was a pleasure to see you today!

## 2017-07-20 NOTE — Progress Notes (Signed)
    Julia Hansen 03/31/1986 [id number]        31 y.o.  G1P1001   RP:  Irritated, sensitive area near rectum  HPI:  Well on Nuvaring.  Normal light periods.  No pelvic pain.  Had a SVD 06/2016.  Anxiety on Zoloft and Xanax PRN.  C/O perianal irritation, sensitivity.  Worried that may have a hemorrhoid.  No rectal bleeding.  BMs wnl.  Mictions normal.   Past medical history,surgical history, problem list, medications, allergies, family history and social history were all reviewed and documented in the EPIC chart.  Directed ROS with pertinent positives and negatives documented in the history of present illness/assessment and plan.  Exam:  Vitals:   07/16/17 1437  Weight: 155 lb (70.3 kg)  Height: 5' 1.5" (1.562 m)   General appearance:  Normal  Gyn/Perianal exam:  Vulva normal, perineum normal.  Perianal area:  No erythema, no fissure and no thrombosed or distended hemorrhoid externally.  Rectal exam:  No internal hemorrhoid felt, no lesion felt.  Assessment/Plan:  31 y.o. G1P1001   1. Perianal pain Normal perianal area and rectal exam.  No hemorrhoid, no fissure, no sign of inflammation.  2. Worried well Reassured  Counseling on above issues >50% x 15 min.  Genia DelMarie-Lyne Markisha Meding MD, 4:45 PM 07/20/2017

## 2017-07-22 ENCOUNTER — Ambulatory Visit: Payer: 59 | Admitting: Obstetrics & Gynecology

## 2017-07-23 ENCOUNTER — Telehealth: Payer: Self-pay | Admitting: Neurology

## 2017-07-23 NOTE — Telephone Encounter (Signed)
Patient is having sleep issues. I scheduled her with Dr. Vickey Hugerohmeier on 08-14-17 and put her on waiting list in case of cancellations.

## 2017-07-23 NOTE — Telephone Encounter (Signed)
Placed patient on the wait list °

## 2017-08-14 ENCOUNTER — Encounter: Payer: Self-pay | Admitting: Neurology

## 2017-08-14 ENCOUNTER — Ambulatory Visit (INDEPENDENT_AMBULATORY_CARE_PROVIDER_SITE_OTHER): Payer: Self-pay | Admitting: Neurology

## 2017-08-14 VITALS — BP 115/78 | HR 91 | Ht 61.0 in | Wt 159.0 lb

## 2017-08-14 DIAGNOSIS — F5103 Paradoxical insomnia: Secondary | ICD-10-CM

## 2017-08-14 MED ORDER — SERTRALINE HCL 25 MG PO TABS: 25.0000 mg | ORAL_TABLET | Freq: Every day | ORAL | 5 refills | Status: AC

## 2017-08-14 NOTE — Patient Instructions (Signed)
Please remember to try to maintain good sleep hygiene, which means: Keep a regular sleep and wake schedule, try not to exercise or have a meal within 2 hours of your bedtime, try to keep your bedroom conducive for sleep, that is, cool and dark, without light distractors such as an illuminated alarm clock, and refrain from watching TV right before sleep or in the middle of the night and do not keep the TV or radio on during the night. Also, try not to use or play on electronic devices at bedtime, such as your cell phone, tablet PC or laptop. If you like to read at bedtime on an electronic device, try to dim the background light as much as possible. Do not eat in the middle of the night.   Sertraline tablets What is this medicine? SERTRALINE (SER tra leen) is used to treat depression. It may also be used to treat obsessive compulsive disorder, panic disorder, post-trauma stress, premenstrual dysphoric disorder (PMDD) or social anxiety. This medicine may be used for other purposes; ask your health care provider or pharmacist if you have questions. COMMON BRAND NAME(S): Zoloft What should I tell my health care provider before I take this medicine? They need to know if you have any of these conditions: -bleeding disorders -bipolar disorder or a family history of bipolar disorder -glaucoma -heart disease -high blood pressure -history of irregular heartbeat -history of low levels of calcium, magnesium, or potassium in the blood -if you often drink alcohol -liver disease -receiving electroconvulsive therapy -seizures -suicidal thoughts, plans, or attempt; a previous suicide attempt by you or a family member -take medicines that treat or prevent blood clots -thyroid disease -an unusual or allergic reaction to sertraline, other medicines, foods, dyes, or preservatives -pregnant or trying to get pregnant -breast-feeding How should I use this medicine? Take this medicine by mouth with a glass of  water. Follow the directions on the prescription label. You can take it with or without food. Take your medicine at regular intervals. Do not take your medicine more often than directed. Do not stop taking this medicine suddenly except upon the advice of your doctor. Stopping this medicine too quickly may cause serious side effects or your condition may worsen. A special MedGuide will be given to you by the pharmacist with each prescription and refill. Be sure to read this information carefully each time. Talk to your pediatrician regarding the use of this medicine in children. While this drug may be prescribed for children as young as 7 years for selected conditions, precautions do apply. Overdosage: If you think you have taken too much of this medicine contact a poison control center or emergency room at once. NOTE: This medicine is only for you. Do not share this medicine with others. What if I miss a dose? If you miss a dose, take it as soon as you can. If it is almost time for your next dose, take only that dose. Do not take double or extra doses. What may interact with this medicine? Do not take this medicine with any of the following medications: -cisapride -dofetilide -dronedarone -linezolid -MAOIs like Carbex, Eldepryl, Marplan, Nardil, and Parnate -methylene blue (injected into a vein) -pimozide -thioridazine This medicine may also interact with the following medications: -alcohol -amphetamines -aspirin and aspirin-like medicines -certain medicines for depression, anxiety, or psychotic disturbances -certain medicines for fungal infections like ketoconazole, fluconazole, posaconazole, and itraconazole -certain medicines for irregular heart beat like flecainide, quinidine, propafenone -certain medicines for migraine headaches like almotriptan,  eletriptan, frovatriptan, naratriptan, rizatriptan, sumatriptan, zolmitriptan -certain medicines for sleep -certain medicines for seizures  like carbamazepine, valproic acid, phenytoin -certain medicines that treat or prevent blood clots like warfarin, enoxaparin, dalteparin -cimetidine -digoxin -diuretics -fentanyl -isoniazid -lithium -NSAIDs, medicines for pain and inflammation, like ibuprofen or naproxen -other medicines that prolong the QT interval (cause an abnormal heart rhythm) -rasagiline -safinamide -supplements like St. John's wort, kava kava, valerian -tolbutamide -tramadol -tryptophan This list may not describe all possible interactions. Give your health care provider a list of all the medicines, herbs, non-prescription drugs, or dietary supplements you use. Also tell them if you smoke, drink alcohol, or use illegal drugs. Some items may interact with your medicine. What should I watch for while using this medicine? Tell your doctor if your symptoms do not get better or if they get worse. Visit your doctor or health care professional for regular checks on your progress. Because it may take several weeks to see the full effects of this medicine, it is important to continue your treatment as prescribed by your doctor. Patients and their families should watch out for new or worsening thoughts of suicide or depression. Also watch out for sudden changes in feelings such as feeling anxious, agitated, panicky, irritable, hostile, aggressive, impulsive, severely restless, overly excited and hyperactive, or not being able to sleep. If this happens, especially at the beginning of treatment or after a change in dose, call your health care professional. Bonita Quin may get drowsy or dizzy. Do not drive, use machinery, or do anything that needs mental alertness until you know how this medicine affects you. Do not stand or sit up quickly, especially if you are an older patient. This reduces the risk of dizzy or fainting spells. Alcohol may interfere with the effect of this medicine. Avoid alcoholic drinks. Your mouth may get dry. Chewing  sugarless gum or sucking hard candy, and drinking plenty of water may help. Contact your doctor if the problem does not go away or is severe. What side effects may I notice from receiving this medicine? Side effects that you should report to your doctor or health care professional as soon as possible: -allergic reactions like skin rash, itching or hives, swelling of the face, lips, or tongue -anxious -black, tarry stools -changes in vision -confusion -elevated mood, decreased need for sleep, racing thoughts, impulsive behavior -eye pain -fast, irregular heartbeat -feeling faint or lightheaded, falls -feeling agitated, angry, or irritable -hallucination, loss of contact with reality -loss of balance or coordination -loss of memory -painful or prolonged erections -restlessness, pacing, inability to keep still -seizures -stiff muscles -suicidal thoughts or other mood changes -trouble sleeping -unusual bleeding or bruising -unusually weak or tired -vomiting Side effects that usually do not require medical attention (report to your doctor or health care professional if they continue or are bothersome): -change in appetite or weight -change in sex drive or performance -diarrhea -increased sweating -indigestion, nausea -tremors This list may not describe all possible side effects. Call your doctor for medical advice about side effects. You may report side effects to FDA at 1-800-FDA-1088. Where should I keep my medicine? Keep out of the reach of children. Store at room temperature between 15 and 30 degrees C (59 and 86 degrees F). Throw away any unused medicine after the expiration date. NOTE: This sheet is a summary. It may not cover all possible information. If you have questions about this medicine, talk to your doctor, pharmacist, or health care provider.  2018 Elsevier/Gold Standard (2016-11-29 14:17:49)

## 2017-08-14 NOTE — Progress Notes (Signed)
S[facility]EP [facility]INE CLINIC   Provider:  Larey Seat, M D  Referring Provider: No ref. provider found Primary Care Physician: Julia Aase, MD   Chief Complaint  Patient presents with  . Follow-up    pt alone, pt says she doesnt sleep all the way. she states that she "feels hungover" head pounding. states that she falls asleep during the day.     HPI:  Rv 08-14-2017,  Julia Hansen reports today that her jitteriness, her in a unrest and trembling has finally seized. 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  She did not choose medication treatment aside form 25 mg zoloft , and states that she just got better slowly by the "grace of God".she never filled seroquel. She never reached Zoloft 50 mg. She had  Trazodone but she stopped after feeling weird..  She remains calm but she still has insomnia - as she had most of her adult life. She seldomly now has heart palpitations or panic attacks at night, but once or twice each week she will have a night in which she can't sleep and only dozes. She feels that is a very light the stage of sleep, she is easily aroused, hypervigilant still. She said that her father has taken care of her baby overnight so that she can function at work 100%. She felt that there was no specific trigger to those nights with poor restorative sleep quality. She feels after one of those nights that her eyes are burning, her head is pounding she feels" hung over ". She uses earplugs, in a cool room.  She is in much better spirits than when I last met her, the baby has been weaned and she is no longer breast-feeding, she is no longer sleep deprived. Her life is good, but there are these nights that still may benefit from Xanax?  She doesn't want to go back on SSRI, felt hypervigilant after higher dose than 25 mg.      Julia Hansen is a 31 y.o. female , seen here as a referral  from Dr. 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 She was placed on Zoloft later in her teenage years after she became worried about her tremor, which her physicians attributed to anxiety or panic attacks. She continued to take Zoloft until she got pregnant at age 66. She is married and this was a planned pregnancy. After giving birth to a healthy baby boy she developed panic attacks, she stopped sleeping, her baby would wake her but she was unable to initiate sleep or re-initiate sleep eventually after about 4 months, panic attacks that in about 6 weeks ago- at around Christmas, At the time she was not on medication, was breast-feeding but the continued sleep deprivation culminated the and in panic attacks. She was just recently put back on Zoloft after she complained about constant hypervigilance the inability to relax to sleep or take her mind off. She feels that she cannot function in her current state of sleep deprivation. She has taken Xanax and could still not initiate sleep. She just restarted Zoloft at 25 mg only and states that her mind is foggy she feels out of control very irritable impulsive unable to  sit still, her eyes hurt she feels trembles, palpitations - she was on this medication for over10 years before she became pregnant! Her baby is now 56-monthold. 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 Perhaps she feels undeserving.  Sleep habits are as follows: She has always needed earplugs, a dark room. She travelled as a tennis pro, and couldn't sleep in hotel rooms. She feels that she is a very light sleeper and that her sleep is  interrupted by any sound. This has been worsened since the birth of her baby now she is very hypervigilant and it has left to sleep deprivation. She has seen a psychiatrist, Dr. @ [facility]. She feels ZOLOFT is making her worse.   Social history:  Married for 3 year, new mo, tennis pro,  No tobacco use, ETOH seldomly, caffeine- stopped all together.   Review of Systems: Out of a complete 14 system review, the patient complains of only the following symptoms, and all other reviewed systems are negative. Chronic insomnia pretty much since childhood. Always a very light sleeper, always the feeling that she didn't sleep enough, always a tendency to have fragmented sleep or being easily aroused area did very sensitive to stimuli. Before she had a baby she already had spinning sensations, palpitations she did much better with Zoloft at the time and earplugs. He would sometimes take a Xanax and had no problems tolerating it noted chiefly groggy. She had never tried another antidepressant. Now she has insomnia, anxiety, tremors, the feeling of not getting enough sleep and decreased level of daytime energy mood changes racing thoughts, death interest in activities, spinning sensation-vertigo, diarrhea, feeling hot, shortness of breath, palpitations.   All these came when she restarted Zoloft after the birth of her baby  Epworth score  0, Fatigue severity score 58  , depression score n/a   Social History   Social History  . Marital status: Married    Spouse name: N/A  . Number of children: 1  . Years of education: BA   Occupational History  . Not on file.   Social History Main Topics  . Smoking status: Never Smoker  . Smokeless tobacco: Never Used  . Alcohol use Yes     Comment: occ not with preg  . Drug use: No  . Sexual activity: Yes    Birth control/ protection: None, Condom   Other Topics Concern  . Not on file   Social History Narrative   Denies using caffeine     Family History    Problem Relation Age of Onset  . Hypertension Mother     Past Medical History:  Diagnosis Date  . Anxiety   . History of fainting   . Panic attacks   . Seizures ([place]    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     Past Surgical History:  Procedure Laterality Date  . SHOULDER SURGERY Right   . TONSILLECTOMY    . TONSILLECTOMY    . WISDOM TOOTH EXTRACTION      Current Outpatient Prescriptions  Medication Sig Dispense Refill  . ALPRAZolam (XANAX) 0.5 MG tablet Take 0.5 mg by mouth at bedtime as needed for anxiety.    . calcium carbonate (TUMS - DOSED IN MG ELEMENTAL CALCIUM) 500 MG chewable tablet Chew 1-2 tablets  by mouth 2 (two) times daily as needed for indigestion or heartburn.    . Docosahexaenoic Acid (DHA OMEGA 3 PO) Take 1 capsule by mouth 2 (two) times daily.    . fluticasone (FLONASE) 50 MCG/ACT nasal spray Place 2 sprays into both nostrils daily. 16 g 3  . ibuprofen (ADVIL,MOTRIN) 800 MG tablet Take 1 tablet (800 mg total) by mouth every 8 (eight) hours. 30 tablet 1  . Multiple Vitamins-Minerals (MULTIVITAMIN WITH MINERALS) tablet Take 1 tablet by mouth daily.    . sertraline (ZOLOFT) 25 MG tablet Take 25 mg by mouth daily.     No current facility-administered medications for this visit.     Allergies as of 08/14/2017 - Review Complete 08/14/2017  Allergen Reaction Noted  . Trazodone and nefazodone  01/03/2017  . Prednisone Anxiety and Other (See Comments) 06/21/2016    Vitals: BP 115/78   Pulse 91   Ht _0  (1.549 m)   Wt 159 lb (72.1 kg)   BMI 30.04 kg/m  Last Weight:  Wt Readings from Last 1 Encounters:  08/14/17 159 lb (72.1 kg)   GGY:IRSW mass index is 30.04 kg/m.     Last Height:   Ht Readings from Last 1 Encounters:  08/14/17 _1  (1.549 m)    Physical exam:  General: The patient is awake, alert and appears not in acute distress. The patient is well groomed. Head: Normocephalic, atraumatic. Neck is supple. Mallampati 3,  neck  circumference:15.5 Cardiovascular:  Regular rate and rhythm, without  murmurs or carotid bruit, and without distended neck veins. Respiratory: Lungs are clear to auscultation. Skin:  Without evidence of edema, or rash Trunk: BMI is 29. The patient's posture is erect  Neurologic exam : The patient is awake and alert, oriented to place and time.   Memory subjective  described as intact.  Attention span & concentration ability appears normal.  Speech is pressured - non stop-  without  dysarthria, dysphonia or aphasia.  Mood and affect are appropriate.  Cranial nerves: Pupils are equal and briskly reactive to light. Funduscopic exam without  evidence of pallor or edema. Extraocular movements  in vertical and horizontal planes intact and without nystagmus.  Facial motor strength is symmetric and tongue and uvula move midline. Shoulder shrug was symmetrical.  Motor exam:   Normal tone, muscle bulk and symmetric strength in all extremities. Gait and station:  Strength within normal limits. Stance is stable and normal.   Deep tendon reflexes: in the  upper and lower extremities are symmetric and intact. Babinski maneuver response is downgoing.  The patient was advised of the nature of the diagnosed sleep disorder , the treatment options and risks for general a health and wellness arising from not treating the condition.  I spent more than 25 minutes of face to face time with the patient. Greater than 50% of time was spent in counseling and coordination of care. We have discussed the diagnosis and differential and I answered the patient's questions.     Assessment:  After physical and neurologic examination, review of laboratory studies,  Personal review of imaging studies, reports of other /same  Imaging studies ,  Results of polysomnography/ neurophysiology testing and pre-existing records as far as provided in visit., my assessment is   1)  Still having cyclic insomnia- with Sub-Mania - all this   culminated after birth of her baby. She has struggled with insomnia for years but it was always treated without side effects when she took Zoloft.  She would occasionally need the Xanax, but she never had the need to take any additional medication on a daily basis. She has also never been on other antidepressants except for trial of trazodone which was designated for sleep aid.    2) not an organic sleep disorder-  no snoring, no apnea, 1-2 nocturia, sleep related morning headaches.     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  Post natal exacerbation. Racing thoughts.  She is permitted to take either the Ambien or Xanax at night if she cannot find sleep.She was warned not to mix these medications within the same night. She should take Zoloft 50 mg in the morning.  She is very concerned about this rising dose. I asked her to alternate 25 with 50 mg every other day. I will call her next week and meet with her in 4-6 weeks.      Asencion Partridge Nettye Flegal MD  08/14/2017   CC:

## 2017-08-15 ENCOUNTER — Telehealth: Payer: Self-pay | Admitting: *Deleted

## 2017-08-15 NOTE — Telephone Encounter (Signed)
Pt called and left message in triage voicemail stating she has questing regarding nuvaring. Left message for pt to call.

## 2017-09-02 ENCOUNTER — Other Ambulatory Visit: Payer: Self-pay

## 2017-09-02 MED ORDER — ETONOGESTREL-ETHINYL ESTRADIOL 0.12-0.015 MG/24HR VA RING: 1.0000 | VAGINAL_RING | VAGINAL | 7 refills | Status: DC

## 2017-10-17 ENCOUNTER — Telehealth: Payer: Self-pay | Admitting: *Deleted

## 2017-10-17 NOTE — Telephone Encounter (Signed)
Pt called c/o left side lower quad discomfort x 2 weeks, states she feel pressure, states history of ovarian cyst in past. No pain, discomfort that is not resolving. No urinary symptoms,, explained she would need OV with provider for exam, will have front desk call to schedule.

## 2017-10-21 ENCOUNTER — Ambulatory Visit: Payer: BLUE CROSS/BLUE SHIELD | Admitting: Women's Health

## 2017-10-21 ENCOUNTER — Encounter: Payer: Self-pay | Admitting: Women's Health

## 2017-10-21 VITALS — BP 118/80 | Ht 61.0 in | Wt 160.0 lb

## 2017-10-21 DIAGNOSIS — K640 First degree hemorrhoids: Secondary | ICD-10-CM

## 2017-10-21 DIAGNOSIS — R1032 Left lower quadrant pain: Secondary | ICD-10-CM | POA: Diagnosis not present

## 2017-10-21 MED ORDER — HYDROCORTISONE ACE-PRAMOXINE 2.5-1 % RE CREA: 1.0000 "application " | TOPICAL_CREAM | Freq: Three times a day (TID) | RECTAL | 1 refills | Status: DC

## 2017-10-21 NOTE — Patient Instructions (Signed)
Hemorrhoids    Hemorrhoids are swollen veins in and around the rectum or anus. Hemorrhoids can cause pain, itching, or bleeding. Most of the time, they do not cause serious problems. They usually get better with diet changes, lifestyle changes, and other home treatments.  Follow these instructions at home:  Eating and drinking  · Eat foods that have fiber, such as whole grains, beans, nuts, fruits, and vegetables. Ask your doctor about taking products that have added fiber (fiber supplements).  · Drink enough fluid to keep your pee (urine) clear or pale yellow.  For Pain and Swelling  · Take a warm-water bath (sitz bath) for 20 minutes to ease pain. Do this 3-4 times a day.  · If directed, put ice on the painful area. It may be helpful to use ice between your warm baths.  ¨ Put ice in a plastic bag.  ¨ Place a towel between your skin and the bag.  ¨ Leave the ice on for 20 minutes, 2-3 times a day.  General instructions  · Take over-the-counter and prescription medicines only as told by your doctor.  ¨ Medicated creams and medicines that are inserted into the anus (suppositories) may be used or applied as told.  · Exercise often.  · Go to the bathroom when you have the urge to poop (to have a bowel movement). Do not wait.  · Avoid pushing too hard (straining) when you poop.  · Keep the butt area dry and clean. Use wet toilet paper or moist paper towels.  · Do not sit on the toilet for a long time.  Contact a doctor if:  · You have any of these:  ¨ Pain and swelling that do not get better with treatment or medicine.  ¨ Bleeding that will not stop.  ¨ Trouble pooping or you cannot poop.  ¨ Pain or swelling outside the area of the hemorrhoids.  This information is not intended to replace advice given to you by your health care provider. Make sure you discuss any questions you have with your health care provider.  Document Released: 09/03/2008 Document Revised: 05/02/2016 Document Reviewed: 08/09/2015  Elsevier  Interactive Patient Education © 2018 Elsevier Inc.   

## 2017-10-21 NOTE — Progress Notes (Signed)
31 yo MWF G1P1 presents for left "hip" pain for past 3 weeks and hematochezia. History of recurrent left ovarian cysts that rupture dating back to college. Had previously treated with OCPs, currently uses NuvaRing and has been using as prescribed. States both methods have decreased occurrence of ovarian cysts. Reports abdominal swelling and bloating along with the left sided inguinal pain. Noted small amount of bright red blood on toilet tissue and pain with defecation over the past week, no prolapsed hemorrhoid noted. Same partner, monogamous relationship. Denies any urinary symptoms and vaginal discharge, odor, or irritation. 3415 month old son, suffered from postpartum anxiety following his birth, currently taking 25 mg Zoloft, previously been on 50 mg but is having difficulty with anxiety when increasing dose  and 0.5 mg Xanax as needed. No other medical problems. Questions if she has a vaginal prolapse. Reports good family support.  Exam: Appears well, somewhat anxious. No CVAT. Abdomen soft and nontender with normoactive bowel sounds. External genitalia within normal limits, bimanual exam, uterus small nontender no adnexal fullness or tenderness on either side. Rectal exam small 1 cm palpable tender hemorrhoid.  Left inguinal pain, etiology questionable  left ruptured ovarian cyst Small internal hemorrhoid Anxiety-Zoloft and counseling  Plan: Analpram HC rectal cream as needed for hemorrhoids. Prescription given. Reassured that likely etiology of inguinal pain is ruptured ovarian cyst, given history. Counseled that ultrasound may be warranted if symptoms persist. Instructed to call or return to office if no improvement or continued rectal pain with blood. Reassurance given regarding no visible vaginal prolapse

## 2017-11-04 ENCOUNTER — Emergency Department (HOSPITAL_BASED_OUTPATIENT_CLINIC_OR_DEPARTMENT_OTHER)
Admission: EM | Admit: 2017-11-04 | Discharge: 2017-11-04 | Disposition: A | Discharge status: Home / Self Care | Payer: Worker's Compensation | Attending: Emergency Medicine | Admitting: Emergency Medicine

## 2017-11-04 ENCOUNTER — Encounter (HOSPITAL_BASED_OUTPATIENT_CLINIC_OR_DEPARTMENT_OTHER): Payer: Self-pay

## 2017-11-04 ENCOUNTER — Other Ambulatory Visit: Payer: Self-pay

## 2017-11-04 DIAGNOSIS — Z79899 Other long term (current) drug therapy: Secondary | ICD-10-CM | POA: Diagnosis not present

## 2017-11-04 DIAGNOSIS — M542 Cervicalgia: Secondary | ICD-10-CM | POA: Insufficient documentation

## 2017-11-04 DIAGNOSIS — Y999 Unspecified external cause status: Secondary | ICD-10-CM | POA: Insufficient documentation

## 2017-11-04 DIAGNOSIS — Y939 Activity, unspecified: Secondary | ICD-10-CM | POA: Insufficient documentation

## 2017-11-04 DIAGNOSIS — Y929 Unspecified place or not applicable: Secondary | ICD-10-CM | POA: Diagnosis not present

## 2017-11-04 MED ORDER — CYCLOBENZAPRINE HCL 10 MG PO TABS: 10.0000 mg | ORAL_TABLET | Freq: Two times a day (BID) | ORAL | 0 refills | Status: DC | PRN

## 2017-11-04 NOTE — ED Notes (Signed)
Pt requesting to go to car for " something" , pt instructed she had not been seen by MD yet
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
## 2017-11-04 NOTE — ED Notes (Signed)
ED Provider at bedside. 
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
## 2017-12-12 ENCOUNTER — Telehealth: Payer: Self-pay | Admitting: *Deleted

## 2017-12-12 NOTE — Telephone Encounter (Signed)
Patient called and left message in voicemail with question about nuvaring. I called pt back and unable to leave a message, voicemail box is full.

## 2017-12-19 ENCOUNTER — Telehealth: Payer: Self-pay | Admitting: *Deleted

## 2017-12-19 NOTE — Telephone Encounter (Signed)
Patient called uses nuvaring states she placed her nuvaring 1 week earlier. She inserted nuvaring on week 2 rather than week 3. She doesn't take a week off to have cycle, has a cycle every 3 months. She asked when does she remove ring? Please advise

## 2017-12-20 NOTE — Telephone Encounter (Signed)
As usual, she can keep the new Nuvaring for 3 weeks.  For her information though, if she wants to save on Nuvarings, she could keep the same ring for 4 weeks and just switch every 4 weeks for continuous use.

## 2017-12-21 ENCOUNTER — Encounter (HOSPITAL_BASED_OUTPATIENT_CLINIC_OR_DEPARTMENT_OTHER): Payer: Self-pay | Admitting: *Deleted

## 2017-12-21 ENCOUNTER — Emergency Department (HOSPITAL_BASED_OUTPATIENT_CLINIC_OR_DEPARTMENT_OTHER)
Admission: EM | Admit: 2017-12-21 | Discharge: 2017-12-21 | Disposition: A | Discharge status: Home / Self Care | Payer: BLUE CROSS/BLUE SHIELD | Attending: Emergency Medicine | Admitting: Emergency Medicine

## 2017-12-21 ENCOUNTER — Other Ambulatory Visit: Payer: Self-pay

## 2017-12-21 ENCOUNTER — Emergency Department (HOSPITAL_BASED_OUTPATIENT_CLINIC_OR_DEPARTMENT_OTHER): Payer: BLUE CROSS/BLUE SHIELD

## 2017-12-21 DIAGNOSIS — R109 Unspecified abdominal pain: Secondary | ICD-10-CM | POA: Diagnosis not present

## 2017-12-21 DIAGNOSIS — R110 Nausea: Secondary | ICD-10-CM | POA: Diagnosis not present

## 2017-12-21 DIAGNOSIS — Z79899 Other long term (current) drug therapy: Secondary | ICD-10-CM | POA: Insufficient documentation

## 2017-12-21 DIAGNOSIS — R140 Abdominal distension (gaseous): Secondary | ICD-10-CM

## 2017-12-21 DIAGNOSIS — K5900 Constipation, unspecified: Secondary | ICD-10-CM | POA: Insufficient documentation

## 2017-12-21 DIAGNOSIS — R197 Diarrhea, unspecified: Secondary | ICD-10-CM | POA: Diagnosis not present

## 2017-12-21 LAB — CBC WITH DIFFERENTIAL/PLATELET
Basophils Absolute: 0 10*3/uL (ref 0.0–0.1)
Basophils Relative: 0 %
Eosinophils Absolute: 0.2 10*3/uL (ref 0.0–0.7)
Eosinophils Relative: 2 %
HCT: 40 % (ref 36.0–46.0)
Hemoglobin: 13.4 g/dL (ref 12.0–15.0)
Lymphocytes Relative: 30 %
Lymphs Abs: 1.9 10*3/uL (ref 0.7–4.0)
MCH: 29.3 pg (ref 26.0–34.0)
MCHC: 33.5 g/dL (ref 30.0–36.0)
MCV: 87.5 fL (ref 78.0–100.0)
Monocytes Absolute: 0.4 10*3/uL (ref 0.1–1.0)
Monocytes Relative: 7 %
Neutro Abs: 3.8 10*3/uL (ref 1.7–7.7)
Neutrophils Relative %: 61 %
Platelets: 212 10*3/uL (ref 150–400)
RBC: 4.57 MIL/uL (ref 3.87–5.11)
RDW: 13.5 % (ref 11.5–15.5)
WBC: 6.2 10*3/uL (ref 4.0–10.5)

## 2017-12-21 LAB — COMPREHENSIVE METABOLIC PANEL
ALT: 27 U/L (ref 14–54)
AST: 27 U/L (ref 15–41)
Albumin: 3.8 g/dL (ref 3.5–5.0)
Alkaline Phosphatase: 40 U/L (ref 38–126)
Anion gap: 4 — ABNORMAL LOW (ref 5–15)
BUN: 10 mg/dL (ref 6–20)
CO2: 25 mmol/L (ref 22–32)
Calcium: 8.8 mg/dL — ABNORMAL LOW (ref 8.9–10.3)
Chloride: 107 mmol/L (ref 101–111)
Creatinine, Ser: 0.77 mg/dL (ref 0.44–1.00)
GFR calc Af Amer: >60 mL/min (ref >60)
GFR calc non Af Amer: >60 mL/min (ref >60)
Glucose, Bld: 89 mg/dL (ref 65–99)
Potassium: 3.8 mmol/L (ref 3.5–5.1)
Sodium: 136 mmol/L (ref 135–145)
Total Bilirubin: 0.6 mg/dL (ref 0.3–1.2)
Total Protein: 7.4 g/dL (ref 6.5–8.1)

## 2017-12-21 LAB — URINALYSIS, MICROSCOPIC (REFLEX)

## 2017-12-21 LAB — URINALYSIS, ROUTINE W REFLEX MICROSCOPIC
Bilirubin Urine: NEGATIVE
Glucose, UA: NEGATIVE mg/dL
Ketones, ur: NEGATIVE mg/dL
Leukocytes, UA: NEGATIVE
Nitrite: NEGATIVE
Protein, ur: NEGATIVE mg/dL
Specific Gravity, Urine: <1.005 — ABNORMAL LOW (ref 1.005–1.030)
pH: 6.5 (ref 5.0–8.0)

## 2017-12-21 LAB — LIPASE, BLOOD: Lipase: 40 U/L (ref 11–51)

## 2017-12-21 LAB — PREGNANCY, URINE: Preg Test, Ur: NEGATIVE

## 2017-12-21 MED ORDER — POLYETHYLENE GLYCOL 3350 17 GM/SCOOP PO POWD: 17.0000 g | Freq: Every day | ORAL | 0 refills | Status: DC

## 2017-12-21 NOTE — ED Triage Notes (Signed)
Constipation x 2 days. 3 days of diarrhea prior to that. Reports nausea, denies vomiting.

## 2017-12-21 NOTE — ED Provider Notes (Signed)
[facility] EMERGENCY DEPARTMENT Provider Note   CSN: [id number] Arrival date & time: 12/21/17  1223     History   Chief Complaint Chief Complaint  Patient presents with  . Constipation    HPI Julia Hansen is a 32 y.o. female.  Patient with no past surgical history presents with complaint of constipation for the past 3 days.  Preceding this, patient had 2 days of watery, nonbloody diarrhea.  She describes abdominal bloating and fullness after she eats for the past 3 months.  She has pain generally in her abdomen that does not radiate.  She feels bloated and says she looks like she is pregnant due to the bloating. No focal right upper quadrant pain or back pain.  No urinary symptoms.  She has had some nausea at times but no vomiting.  For the constipation, she has taken over-the-counter generic laxative and stool softener without relief. The onset of this condition was acute. The course is constant. Aggravating factors: none. Alleviating factors: none.        Past Medical History:  Diagnosis Date  . Anxiety   . History of fainting   . Panic attacks   . 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     Patient Active Problem List   Diagnosis Date Noted  . 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 SVD (spontaneous vaginal delivery) (7/16) 06/24/2016  . Postpartum care following vaginal delivery (7/16) 06/24/2016  . Postpartum state 06/23/2016  . Preterm premature rupture of membranes 06/21/2016    Past Surgical History:  Procedure Laterality Date  . SHOULDER SURGERY Right   . TONSILLECTOMY    . TONSILLECTOMY    . WISDOM TOOTH EXTRACTION      OB History    Gravida Para Term Preterm AB Living   1 1 1     1    SAB TAB Ectopic Multiple Live Births         0 1       Home Medications    Prior to Admission medications   Medication Sig Start Date End Date Taking? Authorizing Provider  ALPRAZolam Prudy Feeler) 0.5 MG tablet Take  0.5 mg by mouth at bedtime as needed for anxiety.    [provider]  calcium carbonate (TUMS - DOSED IN MG ELEMENTAL CALCIUM) 500 MG chewable tablet Chew 1-2 tablets by mouth 2 (two) times daily as needed for indigestion or heartburn.    [provider]  cyclobenzaprine (FLEXERIL) 10 MG tablet Take 1 tablet (10 mg total) by mouth 2 (two) times daily as needed for muscle spasms. 11/04/17   McDonald, Mia A, PA-C  Docosahexaenoic Acid (DHA OMEGA 3 PO) Take 1 capsule by mouth 2 (two) times daily.    [provider]  etonogestrel-ethinyl estradiol (NUVARING) 0.12-0.015 MG/24HR vaginal ring Place 1 each vaginally every 28 (twenty-eight) days. Insert vaginally and leave in place for 3 consecutive weeks, then remove for 1 week. 09/02/17   Genia Del, MD  fluticasone (FLONASE) 50 MCG/ACT nasal spray Place 2 sprays into both nostrils daily. 01/02/17   Forcucci, Courtney, PA-C  ibuprofen (ADVIL,MOTRIN) 800 MG tablet Take 1 tablet (800 mg total) by mouth every 8 (eight) hours. 06/25/16   Raelyn Mora, CNM  Multiple Vitamins-Minerals (MULTIVITAMIN WITH MINERALS) tablet Take 1 tablet by mouth daily.    [provider]  sertraline (ZOLOFT) 25 MG tablet Take 1-2 tablets (25-50 mg total) by mouth daily. Take 1 tab alternating with  2 every other day for 14 days. After that period  take 50 mg daily. 08/14/17   Dohmeier, Porfirio Mylararmen, MD    Family History Family History  Problem Relation Age of Onset  . Hypertension Mother     Social History Social History   Tobacco Use  . Smoking status: Never Smoker  . Smokeless tobacco: Never Used  Substance Use Topics  . Alcohol use: No    Frequency: Never  . Drug use: No     Allergies   Trazodone and nefazodone and Prednisone   Review of Systems Review of Systems  Constitutional: Negative for fever.  HENT: Negative for rhinorrhea and sore throat.   Eyes: Negative for redness.  Respiratory: Negative for cough.     Cardiovascular: Negative for chest pain.  Gastrointestinal: Positive for constipation, diarrhea and nausea. Negative for abdominal pain, blood in stool and vomiting.       Bright red intermittent hemorrhoidal bleeding at baseline.   Genitourinary: Negative for dysuria.  Musculoskeletal: Negative for myalgias.  Skin: Negative for rash.  Neurological: Negative for headaches.     Physical Exam Updated Vital Signs BP (!) 152/101 (BP Location: Left Arm)   Pulse 87   Temp 98.9 F (37.2 C) (Oral)   Resp 18   Ht 5\' 2"  (1.575 m)   Wt 72.6 kg (160 lb)   SpO2 100%   BMI 29.26 kg/m   Physical Exam  Constitutional: She appears well-developed and well-nourished.  HENT:  Head: Normocephalic and atraumatic.  Mouth/Throat: Oropharynx is clear and moist.  Eyes: Conjunctivae are normal. Right eye exhibits no discharge. Left eye exhibits no discharge.  Neck: Normal range of motion. Neck supple.  Cardiovascular: Normal rate, regular rhythm and normal heart sounds.  Pulmonary/Chest: Effort normal and breath sounds normal. No stridor. No respiratory distress. She has no wheezes.  Abdominal: Soft. There is no tenderness. There is no rebound and no guarding.  Neurological: She is alert.  Skin: Skin is warm and dry.  Psychiatric: She has a normal mood and affect.  Nursing note and vitals reviewed.    ED Treatments / Results  Labs (all labs ordered are listed, but only abnormal results are displayed) Labs Reviewed  COMPREHENSIVE METABOLIC PANEL - Abnormal; Notable for the following components:      Result Value   Calcium 8.8 (*)    Anion gap 4 (*)    All other components within normal limits  URINALYSIS, ROUTINE W REFLEX MICROSCOPIC - Abnormal; Notable for the following components:   Specific Gravity, Urine <1.005 (*)    Hgb urine dipstick MODERATE (*)    All other components within normal limits  URINALYSIS, MICROSCOPIC (REFLEX) - Abnormal; Notable for the following components:    Bacteria, UA RARE (*)    Squamous Epithelial / LPF 0-5 (*)    All other components within normal limits  CBC WITH DIFFERENTIAL/PLATELET  LIPASE, BLOOD  PREGNANCY, URINE    EKG  EKG Interpretation None       Radiology Dg Abdomen 1 View  Result Date: 12/21/2017 CLINICAL DATA:  32 y/o  F; constipation and distention. EXAM: ABDOMEN - 1 VIEW COMPARISON:  10/10/2014 CT abdomen and pelvis. FINDINGS: Normal bowel gas pattern. No radiopaque urinary stone disease identified. Moderate volume of stool in the colon. Transitional lumbosacral anatomy. Bones are otherwise unremarkable. IMPRESSION: Normal bowel gas pattern.  Moderate volume of stool in colon. Electronically Signed   By: Mitzi HansenLance  Furusawa-Stratton M.D.   On: 12/21/2017 13:44    Procedures  Procedures (including critical care time)  Medications Ordered in ED Medications - No data to display   Initial Impression / Assessment and Plan / ED Course  I have reviewed the triage vital signs and the nursing notes.  Pertinent labs & imaging results that were available during my care of the patient were reviewed by me and considered in my medical decision making (see chart for details).     Patient seen and examined. Work-up initiated.  Benign exam at time of visit.  Patient is worried that her symptoms are caused from her gallbladder, given that family member had recent cholecystectomy.  Will check labs, UA, KUB to assess stool burden.  Anticipate discharged home with treatments for constipation as well as PCP follow-up for further eval if symptoms persist.  She may need outpatient GI workup.  Vital signs reviewed and are as follows: BP (!) 152/101 (BP Location: Left Arm)   Pulse 87   Temp 98.9 F (37.2 C) (Oral)   Resp 18   Ht 5\' 2"  (1.575 m)   Wt 72.6 kg (160 lb)   SpO2 100%   BMI 29.26 kg/m   2:10 PM discussed findings with patient.  We will treat constipation and have patient follow-up with PCP if symptoms are not improved.   Will start with MiraLAX twice daily.  On reexam, abdomen remains soft and nontender.  The patient was urged to return to the Emergency Department immediately with worsening of current symptoms, worsening abdominal pain, persistent vomiting, blood noted in stools, fever, or any other concerns. The patient verbalized understanding.    Final Clinical Impressions(s) / ED Diagnoses   Final diagnoses:  Constipation, unspecified constipation type  Abdominal bloating   Patient with constipation and abdominal bloating.  No focal tenderness on exam.  Lab workup, UA, KUB are reassuring.  No signs of obstruction.  Will treat as above.  ED Discharge Orders        Ordered    polyethylene glycol powder (GLYCOLAX/MIRALAX) powder  Daily     12/21/17 1408       Renne Crigler, PA-C 12/21/17 1411    Charlynne Pander, MD 12/21/17 [phone]

## 2017-12-21 NOTE — Discharge Instructions (Signed)
Please read and follow all provided instructions.  Your diagnoses today include:  1. Constipation, unspecified constipation type   2. Abdominal bloating     Tests performed today include:  Blood counts and electrolytes  Blood tests to check liver and kidney function  Blood tests to check pancreas function  Urine test to look for infection and pregnancy (in women)  Vital signs. See below for your results today.   Medications prescribed:   Miralax - laxative  This medication can be found over-the-counter.   Take any prescribed medications only as directed.  Home care instructions:   Follow any educational materials contained in this packet.  Follow-up instructions: Please follow-up with your primary care provider in the next 7 days for further evaluation of your symptoms.    Return instructions:  SEEK IMMEDIATE MEDICAL ATTENTION IF:  The pain does not go away or becomes severe   A temperature above 101F develops   Repeated vomiting occurs (multiple episodes)   The pain becomes localized to portions of the abdomen. The right side could possibly be appendicitis. In an adult, the left lower portion of the abdomen could be colitis or diverticulitis.   Blood is being passed in stools or vomit (bright red or black tarry stools)   You develop chest pain, difficulty breathing, dizziness or fainting, or become confused, poorly responsive, or inconsolable (young children)  If you have any other emergent concerns regarding your health  Additional Information: Abdominal (belly) pain can be caused by many things. Your caregiver performed an examination and possibly ordered blood/urine tests and imaging (CT scan, x-rays, ultrasound). Many cases can be observed and treated at home after initial evaluation in the emergency department. Even though you are being discharged home, abdominal pain can be unpredictable. Therefore, you need a repeated exam if your pain does not resolve,  returns, or worsens. Most patients with abdominal pain don't have to be admitted to the hospital or have surgery, but serious problems like appendicitis and gallbladder attacks can start out as nonspecific pain. Many abdominal conditions cannot be diagnosed in one visit, so follow-up evaluations are very important.  Your vital signs today were: BP 118/78    Pulse 84    Temp 98.9 F (37.2 C) (Oral)    Resp 18    Ht 5\' 2"  (1.575 m)    Wt 72.6 kg (160 lb)    SpO2 98%    BMI 29.26 kg/m  If your blood pressure (bp) was elevated above 135/85 this visit, please have this repeated by your doctor within one month. --------------

## 2017-12-23 NOTE — Telephone Encounter (Signed)
Left detailed message on pt work cell per her request [phone]

## 2018-01-11 ENCOUNTER — Other Ambulatory Visit: Payer: Self-pay

## 2018-01-11 ENCOUNTER — Emergency Department (HOSPITAL_COMMUNITY)
Admission: EM | Admit: 2018-01-11 | Discharge: 2018-01-11 | Disposition: A | Discharge status: Home / Self Care | Payer: BLUE CROSS/BLUE SHIELD | Attending: Emergency Medicine | Admitting: Emergency Medicine

## 2018-01-11 ENCOUNTER — Emergency Department (HOSPITAL_COMMUNITY): Payer: BLUE CROSS/BLUE SHIELD

## 2018-01-11 DIAGNOSIS — Y999 Unspecified external cause status: Secondary | ICD-10-CM | POA: Diagnosis not present

## 2018-01-11 DIAGNOSIS — Y9389 Activity, other specified: Secondary | ICD-10-CM | POA: Diagnosis not present

## 2018-01-11 DIAGNOSIS — W228XXA Striking against or struck by other objects, initial encounter: Secondary | ICD-10-CM | POA: Diagnosis not present

## 2018-01-11 DIAGNOSIS — Z79899 Other long term (current) drug therapy: Secondary | ICD-10-CM | POA: Diagnosis not present

## 2018-01-11 DIAGNOSIS — S0990XA Unspecified injury of head, initial encounter: Secondary | ICD-10-CM

## 2018-01-11 DIAGNOSIS — Y92838 Other recreation area as the place of occurrence of the external cause: Secondary | ICD-10-CM | POA: Insufficient documentation

## 2018-01-11 LAB — I-STAT BETA HCG BLOOD, ED (MC, WL, AP ONLY): I-stat hCG, quantitative: <5 m[IU]/mL (ref <5)

## 2018-01-11 MED ORDER — HYDROCODONE-ACETAMINOPHEN 5-325 MG PO TABS
2.0000 | ORAL_TABLET | Freq: Once | ORAL | Status: AC
  Administered 2018-01-11: 2 via ORAL
  Filled 2018-01-11: qty 2

## 2018-01-11 NOTE — ED Provider Notes (Signed)
[facility]-EMERGENCY DEPT Provider Note   CSN: [id number] Arrival date & time: 01/11/18  1521     History   Chief Complaint Chief Complaint  Patient presents with  . Head Injury    HPI Julia Hansen is a 32 y.o. female presents for evaluation of head injury that occurred approximately 1.5 weeks ago.  Patient reports that she was at a restaurant in a children's play area bending down.  She states that she stood up suddenly and hit her head on the plastic slide.  Patient reports that she was disoriented for a few seconds and "saw stars."  She is unsure if he had any LOC but does remember calling back out on the playground.  Patient reports that 2 days later, she hit her head again on another plastic playground.  No LOC at that time.  Patient reports that since then, she has had a persistent headache.  She reports that she has been taking ibuprofen with no improvement in symptoms.  Patient reports that the intensity of the headache will fluctuate.  She has been able to sleep and gets some relief with sleeping. Patient states that she has not had any difficulty ambulating.  Patient is not currently on blood thinners.  Patient denies any vision changes, neck pain, back pain, numbness/weakness of her extremities, vomiting, abdominal pain.  The history is provided by the patient.    Past Medical History:  Diagnosis Date  . Anxiety   . History of fainting   . Panic attacks   . 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     Patient Active Problem List   Diagnosis Date Noted  . 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 SVD (spontaneous vaginal delivery) (7/16) 06/24/2016  . Postpartum care following vaginal delivery (7/16) 06/24/2016  . Postpartum state 06/23/2016  . Preterm premature rupture of membranes 06/21/2016    Past Surgical History:  Procedure Laterality Date  . SHOULDER SURGERY Right   . TONSILLECTOMY    . TONSILLECTOMY     . WISDOM TOOTH EXTRACTION      OB History    Gravida Para Term Preterm AB Living   1 1 1     1    SAB TAB Ectopic Multiple Live Births         0 1       Home Medications    Prior to Admission medications   Medication Sig Start Date End Date Taking? Authorizing Provider  ALPRAZolam Prudy Feeler(XANAX) 0.5 MG tablet Take 0.5 mg by mouth at bedtime as needed for anxiety.    [provider]  calcium carbonate (TUMS - DOSED IN MG ELEMENTAL CALCIUM) 500 MG chewable tablet Chew 1-2 tablets by mouth 2 (two) times daily as needed for indigestion or heartburn.    [provider]  cyclobenzaprine (FLEXERIL) 10 MG tablet Take 1 tablet (10 mg total) by mouth 2 (two) times daily as needed for muscle spasms. 11/04/17   McDonald, Mia A, PA-C  Docosahexaenoic Acid (DHA OMEGA 3 PO) Take 1 capsule by mouth 2 (two) times daily.    [provider]  etonogestrel-ethinyl estradiol (NUVARING) 0.12-0.015 MG/24HR vaginal ring Place 1 each vaginally every 28 (twenty-eight) days. Insert vaginally and leave in place for 3 consecutive weeks, then remove for 1 week. 09/02/17   Genia DelLavoie, Marie-Lyne, MD  fluticasone (FLONASE) 50 MCG/ACT nasal spray Place 2 sprays into both nostrils daily. 01/02/17   Forcucci, Courtney, PA-C  ibuprofen (  ADVIL,MOTRIN) 800 MG tablet Take 1 tablet (800 mg total) by mouth every 8 (eight) hours. 06/25/16   Raelyn Mora, CNM  Multiple Vitamins-Minerals (MULTIVITAMIN WITH MINERALS) tablet Take 1 tablet by mouth daily.    [provider]  polyethylene glycol powder (GLYCOLAX/MIRALAX) powder Take 17 g by mouth daily. 12/21/17   Renne Crigler, PA-C  sertraline (ZOLOFT) 25 MG tablet Take 1-2 tablets (25-50 mg total) by mouth daily. Take 1 tab alternating with 2 every other day for 14 days. After that period  take 50 mg daily. 08/14/17   Dohmeier, Porfirio Mylar, MD    Family History Family History  Problem Relation Age of Onset  . Hypertension Mother     Social History Social  History   Tobacco Use  . Smoking status: Never Smoker  . Smokeless tobacco: Never Used  Substance Use Topics  . Alcohol use: No    Frequency: Never  . Drug use: No     Allergies   Trazodone and nefazodone and Prednisone   Review of Systems Review of Systems  Constitutional: Negative for chills and fever.  Eyes: Negative for visual disturbance.  Respiratory: Negative for shortness of breath.   Cardiovascular: Negative for chest pain.  Gastrointestinal: Negative for abdominal pain, diarrhea, nausea and vomiting.  Musculoskeletal: Negative for back pain and neck pain.  Skin: Negative for rash.  Neurological: Positive for headaches. Negative for dizziness, weakness and numbness.  Psychiatric/Behavioral: Negative for confusion.  All other systems reviewed and are negative.    Physical Exam Updated Vital Signs BP 118/82   Pulse 72   Temp 98.4 F (36.9 C) (Oral)   Resp 17   Ht 5\' 2"  (1.575 m)   Wt 73.5 kg (162 lb)   SpO2 100%   BMI 29.63 kg/m   Physical Exam  Constitutional: She is oriented to person, place, and time. She appears well-developed and well-nourished.  HENT:  Head: Normocephalic and atraumatic.  Right Ear: Tympanic membrane normal. No hemotympanum.  Left Ear: Tympanic membrane normal. No hemotympanum.  Mouth/Throat: Oropharynx is clear and moist and mucous membranes are normal.  No tenderness to palpation of skull. No deformities or crepitus noted. No open wounds, abrasions or lacerations.   Eyes: Conjunctivae, EOM and lids are normal. Pupils are equal, round, and reactive to light.  PERRL. EOMS intact without difficulty.   Neck: Full passive range of motion without pain. Neck supple.  Full flexion/extension and lateral movement of neck fully intact. No bony midline tenderness. No deformities or crepitus. Neck is supple.   Cardiovascular: Normal rate, regular rhythm, normal heart sounds and normal pulses. Exam reveals no gallop and no friction rub.  No  murmur heard. Pulmonary/Chest: Effort normal and breath sounds normal.  Abdominal: Soft. Normal appearance. There is no tenderness. There is no rigidity and no guarding.  Musculoskeletal: Normal range of motion.       Thoracic back: She exhibits no tenderness.       Lumbar back: She exhibits no tenderness.  Neurological: She is alert and oriented to person, place, and time.  Cranial nerves III-XII intact Follows commands, Moves all extremities  5/5 strength to BUE and BLE  Sensation intact throughout all major nerve distributions Normal finger to nose. No dysdiadochokinesia. No pronator drift. No gait abnormalities  No slurred speech. No facial droop.  Negative Romberg  Skin: Skin is warm and dry. Capillary refill takes less than 2 seconds.  Psychiatric: She has a normal mood and affect. Her speech is normal.  Nursing  note and vitals reviewed.    ED Treatments / Results  Labs (all labs ordered are listed, but only abnormal results are displayed) Labs Reviewed  I-STAT BETA HCG BLOOD, ED ([facility], [facility], [facility] ONLY)    EKG  EKG Interpretation None       Radiology Ct Head Wo Contrast  Result Date: 01/11/2018 CLINICAL DATA:  Pt reports about 1.5 weeks she hit her head at a playground with baby. Pt reports that she was under the playground, thinking it was taller she stood straight up and hit head. Pt reports that she then fell down, unknown LOC. EXAM: CT HEAD WITHOUT CONTRAST TECHNIQUE: Contiguous axial images were obtained from the base of the skull through the vertex without intravenous contrast. COMPARISON:  None. FINDINGS: Brain: No evidence of acute infarction, hemorrhage, hydrocephalus, extra-axial collection or mass lesion/mass effect. Vascular: No hyperdense vessel or unexpected calcification. Skull: No osseous abnormality. Sinuses/Orbits: Visualized paranasal sinuses are clear. Visualized mastoid sinuses are clear. Visualized orbits demonstrate no focal abnormality. Other: None  IMPRESSION: No acute intracranial pathology. Electronically Signed   By: Elige Ko   On: 01/11/2018 17:58    Procedures Procedures (including critical care time)  Medications Ordered in ED Medications  HYDROcodone-acetaminophen (NORCO/VICODIN) 5-325 MG per tablet 2 tablet (2 tablets Oral Given 01/11/18 1822)     Initial Impression / Assessment and Plan / ED Course  I have reviewed the triage vital signs and the nursing notes.  Pertinent labs & imaging results that were available during my care of the patient were reviewed by me and considered in my medical decision making (see chart for details).     32 y.o. F who presents for evaluation of head injury that occurred 1.5 week ago.  Patient reports that she hit her head on a plastic children's playground.  She was disoriented initially but was able to leave the area without any difficulty.  Patient reports that 2 days later, she hit her head again.  No vomiting, difficulty in breathing, numbness/weakness.  She is not on any blood thinners. Patient is afebrile, non-toxic appearing, sitting comfortably on examination table. Vital signs reviewed and stable. No neuro deficits noted on exam.  On exam, patient does not have any brain head.  Given duration of symptoms, reassuringrisk factors, do not suspect intracranial pathology at this time.  Consider minor head injury versus postconcussive syndrome symptoms.  History/physical exam not concerning for CVA, dural venous sinus thrombosis, meningitis.  Discussed with patient regarding need for imaging.  Given Canadian head CT rule, patient's presentation, lack of neuro deficits on exam, do not feel that CT head imaging is indicated at this time.  Patient is very anxious and does not feel comfortable leaving the department without imaging reassurance.  Given patient's concerns, will plan for CT head for evaluation. Analgesics provided in the department.  CT head reviewed.  Negative for any acute  abnormality.  Discussed results with patient.  She reports some improvement in headache after analgesics.  Vital signs stable in the department.  Patient able to ambulate without any difficulty.  Patient stable for discharge at this time.  Suspect that symptoms may be related to postconcussive syndrome.  Encourage patient to engage in brain rest to help with symptoms.  Will provide outpatient neurology referral that patient can follow-up with for worsening symptoms. Patient had ample opportunity for questions and discussion. All patient's questions were answered with full understanding. Strict return precautions discussed. Patient expresses understanding and agreement to plan.  Final Clinical Impressions(s) / ED Diagnoses   Final diagnoses:  Minor head injury, initial encounter    ED Discharge Orders    None       Rosana Hoes 01/11/18 1919    Mancel Bale, MD 01/11/18 2318

## 2018-01-11 NOTE — Discharge Instructions (Signed)
You can take Tylenol or Ibuprofen as directed for pain. You can alternate Tylenol and Ibuprofen every 4 hours. If you take Tylenol at 1pm, then you can take Ibuprofen at 5pm. Then you can take Tylenol again at 9pm.   As we did discuss, engage in brain rest.  This includes limiting the amount of phone, TV, computer screen time.  Additionally, he should limit physical activity.  Follow-up with your primary care doctor in the next 24-48 hours for further evaluation.   I have also provided outpatient neurology referall that you can follow-up with for any worsening or concerning symptoms.  Return to the emergency department for any worsening headache, vision changes, difficulty walking, inability to feel or move your arms or legs or any other worsening or concerning symptoms.

## 2018-01-11 NOTE — ED Triage Notes (Signed)
Pt reports about 1.5 weeks she hit her head at a playground with baby. Pt reports that she was under the playground , thinking it was taller she stood straight up. Hitting head. Pt reports that she then fell down, unknown LOC. Pt reports disorientation immediately after, recovering back to baseline in 3-5 mins. Pt reports that she has had a progressive headache and has felt fatigued since. Pt reports OTC motrin with no relief.

## 2018-01-15 ENCOUNTER — Telehealth: Payer: Self-pay

## 2018-01-15 NOTE — Telephone Encounter (Signed)
Patient returned call. She was playing with her child on a playground when she stood up and hit her head on a piece of plastic play equipment. She had a headache and has not gotten rid of this symptom since the injury. She also noticed that she is sensitive to light. 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 Placed on schedule tomorrow morning.

## 2018-01-15 NOTE — Telephone Encounter (Signed)
Tried calling patient. Her voicemail box was full so could not leave a message to call back for concussion clinic appointment.

## 2018-01-16 ENCOUNTER — Ambulatory Visit (INDEPENDENT_AMBULATORY_CARE_PROVIDER_SITE_OTHER): Payer: BLUE CROSS/BLUE SHIELD | Admitting: Family Medicine

## 2018-01-16 ENCOUNTER — Encounter: Payer: Self-pay | Admitting: Family Medicine

## 2018-01-16 DIAGNOSIS — G4486 Cervicogenic headache: Secondary | ICD-10-CM

## 2018-01-16 DIAGNOSIS — R51 Headache: Secondary | ICD-10-CM

## 2018-01-16 MED ORDER — GABAPENTIN 100 MG PO CAPS: 200.0000 mg | ORAL_CAPSULE | Freq: Every day | ORAL | 3 refills | Status: DC

## 2018-01-16 NOTE — Progress Notes (Signed)
Subjective:   I, Wilford GristValerie Wolf, am serving as a scribe for Dr. Antoine PrimasZachary Joleene Burnham, DO.  Chief Complaint: Julia Hansen, DOB: 06/25/1986, is a 32 y.o. female who presents for head injury sustained 1.5 weeks ago. She was playing with her child on a playground when she stood up and hit her head on a piece of plastic play equipment. She had a headache and has not gotten rid of this symptom since the injury. She also noticed that she is sensitive to light. 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    Injury date : 1.5 weeks ago Visit #: 1  History of Present Illness:   Patient's goals/priorities: Return to baseline   Concussion Self-Reported Symptom Score Symptoms rated on a scale 1-6, in last 24 hours  Headache: 3    Nausea: 0  Vomiting: 0  Balance Difficulty: 0   Dizziness: 0  Fatigue: 2  Trouble Falling Asleep: 0   Sleep More Than Usual: 2  Sleep Less Than Usual: 0  Daytime Drowsiness: 0  Photophobia: 5  Phonophobia: 1  Irritability: 4  Sadness: 2  Nervousness: 3  Feeling More Emotional: 2  Numbness or Tingling: 0  Feeling Slowed Down: 5  Feeling Mentally Foggy: 3  Difficulty Concentrating: 4  Difficulty Remembering: 0  Visual Problems: 0      Review of Systems: Pertinent items are noted in HPI.  Review of History: Past Medical History:  Past Medical History:  Diagnosis Date  . Anxiety   . History of fainting   . Panic attacks   . 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     Past Surgical History:  has a past surgical history that includes Shoulder surgery (Right); Tonsillectomy; Tonsillectomy; and Wisdom tooth extraction. Family History: family history includes Hypertension in her mother. Social History:  reports that  has never smoked. she has never used smokeless tobacco. She reports that she does not drink alcohol or use drugs. Current  Medications: has a current medication list which includes the following prescription(s): alprazolam, calcium carbonate, cyclobenzaprine, docosahexaenoic acid, etonogestrel-ethinyl estradiol, fluticasone, ibuprofen, multivitamin with minerals, polyethylene glycol powder, sertraline, and gabapentin. Allergies: is allergic to trazodone and nefazodone and prednisone.  Objective:    Physical Examination Vitals:   01/16/18 1006  BP: 118/82  Pulse: 84  SpO2: 99%   General appearance: alert, appears stated age and cooperative patient is quite anxious Head: Normocephalic, without obvious abnormality, atraumatic Eyes: conjunctivae/corneas clear. PERRL, EOM's intact. Fundi benign. Sclera anicteric. Lungs: clear to auscultation bilaterally and percussion Heart: regular rate and rhythm, S1, S2 normal, no murmur, click, rub or gallop Neurologic: CN 2-12 normal.  Sensation to pain, touch, and proprioception normal.  DTRs  normal in upper and lower extremities. No pathologic reflexes. Neg rhomberg, modified rhomberg, pronator drift, tandem gait, finger-to-nose; see post-concussion vestibular and oculomotor testing in chart Psychiatric: Oriented X3, intact recent and remote memory, judgement and insight, normal mood and affect  Concussion testing performed today: Impact this condition will decrease correspond patient cognitive efficacy index is decreased likely secondary to underlying attention deficit  Neurocognitive testing (ImPACT):   Post #1:   Verbal Memory Composite  77 (31%)   Visual Memory Composite  61 (29%)   Visual Motor Speed Composite  33.20 (33%)   Reaction Time Composite  .75 (13%)   Cognitive Efficiency Index  .  13    Vestibular Screening:       Headache  Dizziness  Smooth Pursuits n n  H. Saccades n n  V. Saccades n n  H. VOR n n  V. VOR n n  Visual Motor Sensitivity n n      Convergence: 5 cm  y n       Assessment:     Plan:   Action/Discussion: Reviewed diagnosis,  management options, expected outcomes, and the reasons for scheduled and emergent follow-up. Questions were adequately answered. Patient expressed verbal understanding and agreement with the following plan.     Patient Education:  Reviewed with patient the risks (i.e, a repeat concussion, post-concussion syndrome, second-impact syndrome) of returning to play prior to complete resolution, and thoroughly reviewed the signs and symptoms of concussion.Reviewed need for complete resolution of all symptoms, with rest AND exertion, prior to return to play.  Reviewed red flags for urgent medical evaluation: worsening symptoms, nausea/vomiting, intractable headache, musculoskeletal changes, focal neurological deficits.  [facility] Concussion Care Plan, which clearly outlines the plans stated above, was given to patient.  I was personally involved with the physical evaluation of and am in agreement with the assessment and treatment plan for this patient.  Greater than 50% of this encounter was spent in direct consultation with the patient in evaluation, counseling, and coordination of care. Duration of encounter: 60 minutes.  After Visit Summary printed out and provided to patient as appropriate.

## 2018-01-16 NOTE — Assessment & Plan Note (Signed)
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  Concussion very mild overall.  Patient given some very small amount of gabapentin at night.  We discussed icing regimen.  Do believe that there is an underlying attention deficit disorder and anxiety disorder that could be probably treated more appropriately.  Patient will be considering this.  Increase fish oil.  Follow-up again in 1 week to make sure patient is responding.

## 2018-01-16 NOTE — Patient Instructions (Signed)
Good to see you  Gabapentin to take at night Exercises 3 times a week.  Ice 20 minutes 2 times daily. Usually after activity and before bed. Fish oil 3 grams daily until headache gone Vitamin D 2000 IU daily  See me again on Friday if not bette r

## 2018-01-22 NOTE — Progress Notes (Signed)
Subjective:   I, Julia Hansen, am serving as a scribe for Dr. Antoine Primas, DO.  Chief Complaint: Julia Hansen, DOB: 08-08-1986, is a 32 y.o. female who presents for follow up for head injury. She has been working and does feel fatigued and develops a headache by the end of the day. She is able to sleep the headache off. She states that she continues to have pain in the right upper cervical spine area.   Injury date : 02/05/18 Visit #: 2  History of Present Illness:   Patient's goals/priorities: Return to baseline   Concussion Self-Reported Symptom Score Symptoms rated on a scale 1-6, in last 24 hours  Headache: 2    Nausea: 0  Vomiting: 0  Balance Difficulty:0   Dizziness:0  Fatigue: 0  Trouble Falling Asleep: 0   Sleep More Than Usual: 0  Sleep Less Than Usual: 0  Daytime Drowsiness: 0  Photophobia: 0  Phonophobia: 0  Irritability:0  Sadness: 0  Nervousness:0  Feeling More Emotional: 0  Numbness or Tingling:0  Feeling Slowed Down: 1  Feeling Mentally Foggy: 0  Difficulty Concentrating: 0  Difficulty Remembering: 0  Visual Problems: 0    Total Symptom Score: 3 Previous Symptom Score: 36  Review of Systems: Pertinent items are noted in HPI.  Review of History: Past Medical History:  Past Medical History:  Diagnosis Date  . Anxiety   . History of fainting   . Panic attacks   . 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     Past Surgical History:  has a past surgical history that includes Shoulder surgery (Right); Tonsillectomy; Tonsillectomy; and Wisdom tooth extraction. Family History: family history includes Hypertension in her mother. Social History:  reports that  has never smoked. she has never used smokeless tobacco. She reports that she does not drink alcohol or use drugs. Current Medications: has a current medication list which includes the following prescription(s): alprazolam, calcium carbonate, cyclobenzaprine,  docosahexaenoic acid, etonogestrel-ethinyl estradiol, fluticasone, gabapentin, ibuprofen, multivitamin with minerals, polyethylene glycol powder, and sertraline. Allergies: is allergic to trazodone and nefazodone and prednisone.  Objective:    Physical Examination Vitals:   01/23/18 1007  BP: 110/72  Pulse: 88  SpO2: 98%   General appearance: alert, appears stated age and cooperative Head: Normocephalic, without obvious abnormality, atraumatic Eyes: conjunctivae/corneas clear. PERRL, EOM's intact. Fundi benign. Sclera anicteric. Lungs: clear to auscultation bilaterally and percussion Heart: regular rate and rhythm, S1, S2 normal, no murmur, click, rub or gallop Neurologic: CN 2-12 normal.  Sensation to pain, touch, and proprioception normal.  DTRs  normal in upper and lower extremities. No pathologic reflexes. Neg rhomberg, modified rhomberg, pronator drift, tandem gait, finger-to-nose; see post-concussion vestibular and oculomotor testing in chart Psychiatric: Oriented X3, intact recent and remote memory, judgement and insight, normal mood and affect  Neck: Inspection mild loss of lordosis. No palpable stepoffs. Negative Spurling's maneuver. Full neck range of motion Grip strength and sensation normal in bilateral hands Strength good C4 to T1 distribution No sensory change to C4 to T1 Negative Hoffman sign bilaterally Reflexes normal Tightness of the trapezius muscles bilaterally.  Osteopathic findings C2 flexed rotated and side bent right C4 flexed rotated and side bent left C6 flexed rotated and side bent left T3 extended rotated and side bent right inhaled third rib     Assessment:     Julia Hansen presents with the following concussion resolved but now whiplash injury.   Plan:  Action/Discussion: Reviewed diagnosis, management options, expected outcomes, and the reasons for scheduled and emergent follow-up. Questions were adequately answered. Patient expressed  verbal understanding and agreement with the following plan.

## 2018-01-23 ENCOUNTER — Encounter: Payer: Self-pay | Admitting: Family Medicine

## 2018-01-23 ENCOUNTER — Ambulatory Visit: Payer: BLUE CROSS/BLUE SHIELD | Admitting: Family Medicine

## 2018-01-23 DIAGNOSIS — R51 Headache: Secondary | ICD-10-CM

## 2018-01-23 DIAGNOSIS — M999 Biomechanical lesion, unspecified: Secondary | ICD-10-CM | POA: Insufficient documentation

## 2018-01-23 DIAGNOSIS — G4486 Cervicogenic headache: Secondary | ICD-10-CM

## 2018-01-23 NOTE — Assessment & Plan Note (Signed)
I believe the patient is improving at this time.  Any type of concussion is completely resolved at this time.  More of a cervicogenic headache.  Responded well to osteopathic manipulation.  Home exercises given for scapula, discussed continuing gabapentin at night if she feels it is beneficial.  Follow-up with me again 4-6 weeks for more manipulation if it is beneficial.

## 2018-01-23 NOTE — Patient Instructions (Addendum)
Good to see you  You are doing well overall  Start the exercises progression and then when you finish ok to play tennis and run COnitnue the vitamins and the exercises  see me again in 3 week s

## 2018-01-23 NOTE — Assessment & Plan Note (Signed)
Decision today to treat with OMT was based on Physical Exam  After verbal consent patient was treated with HVLA, ME, FPR techniques in cervical, thoracic, rib areas  Patient tolerated the procedure well with improvement in symptoms  Patient given exercises, stretches and lifestyle modifications  See medications in patient instructions if given  Patient will follow up in 4 weeks 

## 2018-02-12 ENCOUNTER — Other Ambulatory Visit: Payer: Self-pay

## 2018-02-12 ENCOUNTER — Emergency Department (HOSPITAL_COMMUNITY)
Admission: EM | Admit: 2018-02-12 | Discharge: 2018-02-12 | Disposition: A | Discharge status: Home / Self Care | Payer: BLUE CROSS/BLUE SHIELD | Attending: Emergency Medicine | Admitting: Emergency Medicine

## 2018-02-12 ENCOUNTER — Encounter (HOSPITAL_COMMUNITY): Payer: Self-pay | Admitting: Emergency Medicine

## 2018-02-12 DIAGNOSIS — Z79899 Other long term (current) drug therapy: Secondary | ICD-10-CM | POA: Insufficient documentation

## 2018-02-12 DIAGNOSIS — R197 Diarrhea, unspecified: Secondary | ICD-10-CM | POA: Diagnosis not present

## 2018-02-12 DIAGNOSIS — R112 Nausea with vomiting, unspecified: Secondary | ICD-10-CM | POA: Insufficient documentation

## 2018-02-12 LAB — COMPREHENSIVE METABOLIC PANEL
ALT: 28 U/L (ref 14–54)
AST: 28 U/L (ref 15–41)
Albumin: 3.8 g/dL (ref 3.5–5.0)
Alkaline Phosphatase: 38 U/L (ref 38–126)
Anion gap: 11 (ref 5–15)
BUN: 15 mg/dL (ref 6–20)
CO2: 23 mmol/L (ref 22–32)
Calcium: 8.9 mg/dL (ref 8.9–10.3)
Chloride: 103 mmol/L (ref 101–111)
Creatinine, Ser: 0.8 mg/dL (ref 0.44–1.00)
GFR calc Af Amer: >60 mL/min (ref >60)
GFR calc non Af Amer: >60 mL/min (ref >60)
Glucose, Bld: 129 mg/dL — ABNORMAL HIGH (ref 65–99)
Potassium: 4 mmol/L (ref 3.5–5.1)
Sodium: 137 mmol/L (ref 135–145)
Total Bilirubin: 0.6 mg/dL (ref 0.3–1.2)
Total Protein: 7 g/dL (ref 6.5–8.1)

## 2018-02-12 LAB — URINALYSIS, ROUTINE W REFLEX MICROSCOPIC
Bilirubin Urine: NEGATIVE
Glucose, UA: NEGATIVE mg/dL
Ketones, ur: 5 mg/dL — AB
Nitrite: NEGATIVE
Protein, ur: 30 mg/dL — AB
Specific Gravity, Urine: 1.03 (ref 1.005–1.030)
pH: 5 (ref 5.0–8.0)

## 2018-02-12 LAB — CBC
HCT: 41.9 % (ref 36.0–46.0)
Hemoglobin: 14.2 g/dL (ref 12.0–15.0)
MCH: 29.8 pg (ref 26.0–34.0)
MCHC: 33.9 g/dL (ref 30.0–36.0)
MCV: 88 fL (ref 78.0–100.0)
Platelets: 203 10*3/uL (ref 150–400)
RBC: 4.76 MIL/uL (ref 3.87–5.11)
RDW: 13.3 % (ref 11.5–15.5)
WBC: 18 10*3/uL — ABNORMAL HIGH (ref 4.0–10.5)

## 2018-02-12 LAB — I-STAT BETA HCG BLOOD, ED (MC, WL, AP ONLY): I-stat hCG, quantitative: <5 m[IU]/mL (ref <5)

## 2018-02-12 LAB — LIPASE, BLOOD: Lipase: 38 U/L (ref 11–51)

## 2018-02-12 MED ORDER — ONDANSETRON 4 MG PO TBDP
4.0000 mg | ORAL_TABLET | Freq: Once | ORAL | Status: AC
  Administered 2018-02-12: 4 mg via ORAL

## 2018-02-12 MED ORDER — ONDANSETRON HCL 4 MG/2ML IJ SOLN
4.0000 mg | Freq: Once | INTRAMUSCULAR | Status: AC | PRN
  Administered 2018-02-12: 4 mg via INTRAVENOUS
  Filled 2018-02-12: qty 2

## 2018-02-12 MED ORDER — SODIUM CHLORIDE 0.9 % IV BOLUS (SEPSIS)
1000.0000 mL | Freq: Once | INTRAVENOUS | Status: AC
Start: 2018-02-12 — End: 2018-02-12
  Administered 2018-02-12: 1000 mL via INTRAVENOUS

## 2018-02-12 MED ORDER — ONDANSETRON 4 MG PO TBDP: 4.0000 mg | ORAL_TABLET | Freq: Three times a day (TID) | ORAL | 0 refills | Status: DC | PRN

## 2018-02-12 NOTE — ED Triage Notes (Signed)
Pt c/o 9/10 sharp pain on her lower abd with nausea, vomiting and diarrhea since last night. No fever.

## 2018-02-12 NOTE — ED Provider Notes (Signed)
[facility] EMERGENCY DEPARTMENT Provider Note   CSN: [id number] Arrival date & time: 02/12/18  0253     History   Chief Complaint Chief Complaint  Patient presents with  . Emesis  . Diarrhea    HPI Julia Hansen is a 32 y.o. female.  Patient with no past surgical history presents with acute onset of nausea, vomiting, nonbloody diarrhea about 9:20 PM.  Her symptoms started approximately 20 minutes after her son started with similar symptoms.  She was recently exposed to her father who had nausea, vomiting, and diarrhea as well, now improved.  She has had some cramping lower abdominal pains that occur prior to having diarrhea.  Pain is not constant.  No treatments prior to arrival.  She is concerned about becoming dehydrated.  No urinary symptoms, fever, chest pain or shortness of breath. The onset of this condition was acute. The course is constant. Aggravating factors: none. Alleviating factors: none.        Past Medical History:  Diagnosis Date  . Anxiety   . History of fainting   . Panic attacks   . 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     Patient Active Problem List   Diagnosis Date Noted  . Nonallopathic lesion of cervical region 01/23/2018  . Nonallopathic lesion of thoracic region 01/23/2018  . Nonallopathic lesion of rib cage 01/23/2018  . Cervicogenic headache 01/16/2018  . 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 SVD (spontaneous vaginal delivery) (7/16) 06/24/2016  . Postpartum care following vaginal delivery (7/16) 06/24/2016  . Postpartum state 06/23/2016  . Preterm premature rupture of membranes 06/21/2016    Past Surgical History:  Procedure Laterality Date  . SHOULDER SURGERY Right   . TONSILLECTOMY    . TONSILLECTOMY    . WISDOM TOOTH EXTRACTION      OB History    Gravida Para Term Preterm AB Living   1 1 1     1    SAB TAB Ectopic Multiple Live Births         0 1       Home  Medications    Prior to Admission medications   Medication Sig Start Date End Date Taking? Authorizing Provider  ALPRAZolam Prudy Feeler) 0.5 MG tablet Take 0.5 mg by mouth at bedtime as needed for anxiety.    [provider]  calcium carbonate (TUMS - DOSED IN MG ELEMENTAL CALCIUM) 500 MG chewable tablet Chew 1-2 tablets by mouth 2 (two) times daily as needed for indigestion or heartburn.    [provider]  cyclobenzaprine (FLEXERIL) 10 MG tablet Take 1 tablet (10 mg total) by mouth 2 (two) times daily as needed for muscle spasms. 11/04/17   McDonald, Mia A, PA-C  Docosahexaenoic Acid (DHA OMEGA 3 PO) Take 1 capsule by mouth 2 (two) times daily.    [provider]  etonogestrel-ethinyl estradiol (NUVARING) 0.12-0.015 MG/24HR vaginal ring Place 1 each vaginally every 28 (twenty-eight) days. Insert vaginally and leave in place for 3 consecutive weeks, then remove for 1 week. 09/02/17   Genia Del, MD  fluticasone (FLONASE) 50 MCG/ACT nasal spray Place 2 sprays into both nostrils daily. 01/02/17   Forcucci, Courtney, PA-C  gabapentin (NEURONTIN) 100 MG capsule Take 2 capsules (200 mg total) by mouth at bedtime. 01/16/18   Judi Saa, DO  ibuprofen (ADVIL,MOTRIN) 800 MG tablet Take 1 tablet (800 mg total) by mouth every 8 (eight) hours. 06/25/16   Arita Miss,  Rolitta, CNM  Multiple Vitamins-Minerals (MULTIVITAMIN WITH MINERALS) tablet Take 1 tablet by mouth daily.    [provider]  polyethylene glycol powder (GLYCOLAX/MIRALAX) powder Take 17 g by mouth daily. 12/21/17   Renne CriglerGeiple, Zephyr Sausedo, PA-C  sertraline (ZOLOFT) 25 MG tablet Take 1-2 tablets (25-50 mg total) by mouth daily. Take 1 tab alternating with 2 every other day for 14 days. After that period  take 50 mg daily. 08/14/17   Dohmeier, Porfirio Mylararmen, MD    Family History Family History  Problem Relation Age of Onset  . Hypertension Mother     Social History Social History   Tobacco Use  . Smoking status: Never  Smoker  . Smokeless tobacco: Never Used  Substance Use Topics  . Alcohol use: No    Frequency: Never  . Drug use: No     Allergies   Trazodone and nefazodone and Prednisone   Review of Systems Review of Systems  Constitutional: Negative for appetite change and fever.  HENT: Negative for rhinorrhea and sore throat.   Eyes: Negative for redness.  Respiratory: Negative for cough.   Cardiovascular: Negative for chest pain.  Gastrointestinal: Positive for abdominal pain, diarrhea, nausea and vomiting. Negative for blood in stool.       Negative for hematemesis  Genitourinary: Negative for dysuria.  Musculoskeletal: Negative for myalgias.  Skin: Negative for rash.  Neurological: Negative for light-headedness.     Physical Exam Updated Vital Signs BP 100/70 (BP Location: Right Arm)   Pulse (!) 116   Temp 97.9 F (36.6 C) (Oral)   Resp 20   Ht 5\' 2"  (1.575 m)   Wt 74.8 kg (165 lb)   SpO2 100%   BMI 30.18 kg/m   Physical Exam  Constitutional: She appears well-developed and well-nourished.  HENT:  Head: Normocephalic and atraumatic.  Eyes: Conjunctivae are normal. Right eye exhibits no discharge. Left eye exhibits no discharge.  Neck: Normal range of motion. Neck supple.  Cardiovascular: Normal rate, regular rhythm and normal heart sounds.  Pulmonary/Chest: Effort normal and breath sounds normal. No respiratory distress. She has no wheezes. She has no rales.  Abdominal: Soft. There is tenderness (Mild generalized). There is no rebound and no guarding.  Neurological: She is alert.  Skin: Skin is warm and dry.  Psychiatric: She has a normal mood and affect.  Nursing note and vitals reviewed.    ED Treatments / Results  Labs (all labs ordered are listed, but only abnormal results are displayed) Labs Reviewed  CBC - Abnormal; Notable for the following components:      Result Value   WBC 18.0 (*)    All other components within normal limits  LIPASE, BLOOD    COMPREHENSIVE METABOLIC PANEL  URINALYSIS, ROUTINE W REFLEX MICROSCOPIC  I-STAT BETA HCG BLOOD, ED ([facility], [facility], [facility] ONLY)    EKG  EKG Interpretation None       Radiology No results found.  Procedures Procedures (including critical care time)  Medications Ordered in ED Medications  ondansetron (ZOFRAN-ODT) disintegrating tablet 4 mg (4 mg Oral Given 02/12/18 0324)  ondansetron (ZOFRAN) injection 4 mg (4 mg Intravenous Given 02/12/18 0355)  sodium chloride 0.9 % bolus 1,000 mL (1,000 mLs Intravenous New Bag/Given 02/12/18 0354)     Initial Impression / Assessment and Plan / ED Course  I have reviewed the triage vital signs and the nursing notes.  Pertinent labs & imaging results that were available during my care of the patient were reviewed by me and considered  in my medical decision making (see chart for details).     Patient seen and examined.  She is starting to feel better after fluids and Zofran.  Awaiting lab results.  Leukocytosis noted and expected.  No focal abdominal tenderness or pain.  Do not feel that imaging is indicated at this time given history and exam.  Anticipate discharged home with Zofran, clear liquids/brat diet.  Vital signs reviewed and are as follows: BP 100/70 (BP Location: Right Arm)   Pulse (!) 116   Temp 97.9 F (36.6 C) (Oral)   Resp 20   Ht 5\' 2"  (1.575 m)   Wt 74.8 kg (165 lb)   SpO2 100%   BMI 30.18 kg/m   5:48 AM patient improved.  She is drinking and eating without vomiting.  Continues to have some mild stomach cramps.  She is comfortable with discharge to home.  Discussed clear liquid diet and brat diet.  The patient was urged to return to the Emergency Department immediately with worsening of current symptoms, worsening abdominal pain, persistent vomiting, blood noted in stools, fever, or any other concerns. The patient verbalized understanding.    Final Clinical Impressions(s) / ED Diagnoses   Final diagnoses:  Nausea vomiting and  diarrhea   Patient with symptoms consistent with viral gastroenteritis at this time. + sick contacts with same sx.  Vitals are stable, no fever.  No signs of severe dehydration, tolerating PO's. Lungs are clear. No focal abdominal pain. Low concern for appendicitis, cholecystitis, pancreatitis, ruptured viscus, UTI, kidney stone, aortic dissection, aortic aneurysm or other emergent abdominal etiology. Supportive therapy indicated with return if symptoms worsen. Patient counseled.   ED Discharge Orders        Ordered    ondansetron (ZOFRAN ODT) 4 MG disintegrating tablet  Every 8 hours PRN     02/12/18 0543       Renne Crigler, PA-C 02/12/18 0549    Zadie Rhine, MD 02/12/18 2011

## 2018-02-12 NOTE — ED Notes (Signed)
Pt unable to void at this time. 

## 2018-02-12 NOTE — Discharge Instructions (Signed)
Please read and follow all provided instructions.  Your diagnoses today include:  1. Nausea vomiting and diarrhea     Tests performed today include:  Blood counts and electrolytes  Blood tests to check liver and kidney function  Blood tests to check pancreas function  Urine test to look for infection and pregnancy (in women)  Vital signs. See below for your results today.   Medications prescribed:   Zofran (ondansetron) - for nausea and vomiting  Take any prescribed medications only as directed.  Home care instructions:   Follow any educational materials contained in this packet.   Your abdominal pain, nausea, vomiting, and diarrhea may be caused by a viral gastroenteritis also called 'stomach flu'. You should rest for the next several days. Keep drinking plenty of fluids and use the medicine for nausea as directed.    Drink clear liquids for the next 24 hours and introduce solid foods slowly after 24 hours using the b.r.a.t. diet (Bananas, Rice, Applesauce, Toast, Yogurt).    Follow-up instructions: Please follow-up with your primary care provider in the next 2 days for further evaluation of your symptoms. If you are not feeling better in 48 hours you may have a condition that is more serious and you need re-evaluation.   Return instructions:  SEEK IMMEDIATE MEDICAL ATTENTION IF:  If you have pain that does not go away or becomes severe   A temperature above 101F develops   Repeated vomiting occurs (multiple episodes)   If you have pain that becomes localized to portions of the abdomen. The right side could possibly be appendicitis. In an adult, the left lower portion of the abdomen could be colitis or diverticulitis.   Blood is being passed in stools or vomit (bright red or black tarry stools)   You develop chest pain, difficulty breathing, dizziness or fainting, or become confused, poorly responsive, or inconsolable (young children)  If you have any other emergent  concerns regarding your health  Additional Information: Abdominal (belly) pain can be caused by many things. Your caregiver performed an examination and possibly ordered blood/urine tests and imaging (CT scan, x-rays, ultrasound). Many cases can be observed and treated at home after initial evaluation in the emergency department. Even though you are being discharged home, abdominal pain can be unpredictable. Therefore, you need a repeated exam if your pain does not resolve, returns, or worsens. Most patients with abdominal pain don't have to be admitted to the hospital or have surgery, but serious problems like appendicitis and gallbladder attacks can start out as nonspecific pain. Many abdominal conditions cannot be diagnosed in one visit, so follow-up evaluations are very important.  Your vital signs today were: BP 100/70 (BP Location: Right Arm)    Pulse (!) 116    Temp 97.9 F (36.6 C) (Oral)    Resp 20    Ht 5\' 2"  (1.575 m)    Wt 74.8 kg (165 lb)    SpO2 100%    BMI 30.18 kg/m  If your blood pressure (bp) was elevated above 135/85 this visit, please have this repeated by your doctor within one month. --------------

## 2018-02-12 NOTE — ED Notes (Signed)
PO challenge started

## 2018-02-13 ENCOUNTER — Ambulatory Visit: Payer: Self-pay | Admitting: Family Medicine

## 2018-04-08 ENCOUNTER — Ambulatory Visit: Payer: BLUE CROSS/BLUE SHIELD | Admitting: Obstetrics & Gynecology

## 2018-04-08 ENCOUNTER — Encounter: Payer: Self-pay | Admitting: Obstetrics & Gynecology

## 2018-04-08 VITALS — BP 126/84

## 2018-04-08 DIAGNOSIS — R140 Abdominal distension (gaseous): Secondary | ICD-10-CM | POA: Diagnosis not present

## 2018-04-08 DIAGNOSIS — R350 Frequency of micturition: Secondary | ICD-10-CM

## 2018-04-08 LAB — URINALYSIS, COMPLETE W/RFL CULTURE
Bilirubin Urine: NEGATIVE
Glucose, UA: NEGATIVE
Hyaline Cast: NONE SEEN /LPF
Leukocyte Esterase: NEGATIVE
Nitrites, Initial: NEGATIVE
Protein, ur: NEGATIVE
Specific Gravity, Urine: 1.025 (ref 1.001–1.03)
pH: 5.5 (ref 5.0–8.0)

## 2018-04-08 NOTE — Progress Notes (Signed)
    Julia Hansen Apr 23, 1986 [id number]        32 y.o.  G1P1001 Married.  Son is doing well, 14 month old.  Leaving for [place] next week.  RP: Bloating and frequency of urination  HPI: Intermittent abdominal bloating.  Patient has not noticed what causes the bloating.  Worried about ovarian pathology, in particular ovarian cancer.  History of anxiety.  Seen by Dr Loreta Ave in the past, when had constipation.  Bowel movements currently normal every day.  Frequency of urination without any burning.  No blood in urine.  No fever.  Normal vaginal secretions.  Well on Nuva ring.   OB History  Gravida Para Term Preterm AB Living  SAB TAB Ectopic Multiple Live Births        0 1    # Outcome Date GA Lbr Len/2nd Weight Sex Delivery Anes PTL Lv  1 Term 06/23/16 [redacted]w[redacted]d 33:06 / 02:06 7 lb 15 oz (3.6 kg) M Vag-Spont EPI  LIV    Past medical history,surgical history, problem list, medications, allergies, family history and social history were all reviewed and documented in the EPIC chart.   Directed ROS with pertinent positives and negatives documented in the history of present illness/assessment and plan.  Exam:  Vitals:   04/08/18 1440  BP: 126/84   General appearance:  Normal  Abdomen: Soft, non-tender.  Not currently distended.  No mass felt.  Gynecologic exam: Vulva normal.  Bimanual exam: Uterus anteverted, normal volume, mobile, nontender.  No ovarian cysts or mass felt, nontender.  U/A: Yellow clear, nitrites negative, white blood cells 0-5, red blood cells 10-20, bacteria few.  Urine culture pending.   Assessment/Plan:  32 y.o. G1P1001   1. Frequent urination Urine analysis mildly perturbed, per patient always has some red blood cells in urine and previous urology investigation was negative.  Urine culture pending.  Will wait on urine culture to decide on treatment. - Urinalysis,Complete w/RFL Culture  2. Bloating Recommend having a dietary diary and evaluating  what food might be causing the blotchiness.  Continue to exercise regularly.  If no improvement will refer back to Dr. Rubye Oaks to evaluate for IBS or other gastrointestinal problem.  Gynecologic exam normal today.  Nonetheless, patient will follow up with a pelvic ultrasound to evaluate her uterus and ovaries. - US Transvaginal Non-OB; Future  Counseling on above issues and coordination of care more than 50% for 15 minutes.  Genia Del MD, 2:49 PM 04/08/2018

## 2018-04-09 LAB — CULTURE INDICATED

## 2018-04-10 LAB — URINE CULTURE
MICRO NUMBER:: 90536593
SPECIMEN QUALITY:: ADEQUATE

## 2018-04-11 ENCOUNTER — Encounter: Payer: Self-pay | Admitting: Obstetrics & Gynecology

## 2018-04-11 NOTE — Patient Instructions (Addendum)
1. Frequent urination Urine analysis mildly perturbed, per patient always has some red blood cells in urine and previous urology investigation was negative.  Urine culture pending.  Will wait on urine culture to decide on treatment. - Urinalysis,Complete w/RFL Culture  2. Bloating Recommend having a dietary diary and evaluating what food might be causing the blotchiness.  Continue to exercise regularly.  If no improvement will refer back to Dr. Rubye Oaks to evaluate for IBS or other gastrointestinal problem.  Gynecologic exam normal today.  Nonetheless, patient will follow up with a pelvic ultrasound to evaluate her uterus and ovaries. - US Transvaginal Non-OB; Future  Maleigha, good seeing you today!    Diet for Irritable Bowel Syndrome When you have irritable bowel syndrome (IBS), the foods you eat and your eating habits are very important. IBS may cause various symptoms, such as abdominal pain, constipation, or diarrhea. Choosing the right foods can help ease discomfort caused by these symptoms. Work with your health care provider and dietitian to find the best eating plan to help control your symptoms. What general guidelines do I need to follow?  Keep a food diary. This will help you identify foods that cause symptoms. Write down: ? What you eat and when. ? What symptoms you have. ? When symptoms occur in relation to your meals.  Avoid foods that cause symptoms. Talk with your dietitian about other ways to get the same nutrients that are in these foods.  Eat more foods that contain fiber. Take a fiber supplement if directed by your dietitian.  Eat your meals slowly, in a relaxed setting.  Aim to eat 5-6 small meals per day. Do not skip meals.  Drink enough fluids to keep your urine clear or pale yellow.  Ask your health care provider if you should take an over-the-counter probiotic during flare-ups to help restore healthy gut bacteria.  If you have cramping or diarrhea, try making your  meals low in fat and high in carbohydrates. Examples of carbohydrates are pasta, rice, whole grain breads and cereals, fruits, and vegetables.  If dairy products cause your symptoms to flare up, try eating less of them. You might be able to handle yogurt better than other dairy products because it contains bacteria that help with digestion. What foods are not recommended? The following are some foods and drinks that may worsen your symptoms:  Fatty foods, such as [place] fries.  Milk products, such as cheese or ice cream.  Chocolate.  Alcohol.  Products with caffeine, such as coffee.  Carbonated drinks, such as soda.  The items listed above may not be a complete list of foods and beverages to avoid. Contact your dietitian for more information. What foods are good sources of fiber? Your health care provider or dietitian may recommend that you eat more foods that contain fiber. Fiber can help reduce constipation and other IBS symptoms. Add foods with fiber to your diet a little at a time so that your body can get used to them. Too much fiber at once might cause gas and swelling of your abdomen. The following are some foods that are good sources of fiber:  Apples.  Peaches.  Pears.  Berries.  Figs.  Broccoli (raw).  Cabbage.  Carrots.  Raw peas.  Kidney beans.  Lima beans.  Whole grain bread.  Whole grain cereal.  Where to find more information: [employer] for Functional Gastrointestinal Disorders: www.iffgd.[employer] of Diabetes and Digestive and Kidney Diseases: http://norris-lawson.com/.aspx This information is not intended  to replace advice given to you by your health care provider. Make sure you discuss any questions you have with your health care provider. Document Released: 02/15/2004 Document Revised: 05/02/2016 Document Reviewed: 02/25/2014 Elsevier Interactive Patient  Education  2018 [employer].

## 2018-04-12 ENCOUNTER — Encounter (HOSPITAL_COMMUNITY): Payer: Self-pay | Admitting: Emergency Medicine

## 2018-04-12 ENCOUNTER — Emergency Department (HOSPITAL_COMMUNITY): Payer: BLUE CROSS/BLUE SHIELD

## 2018-04-12 ENCOUNTER — Other Ambulatory Visit: Payer: Self-pay

## 2018-04-12 ENCOUNTER — Emergency Department (HOSPITAL_COMMUNITY)
Admission: EM | Admit: 2018-04-12 | Discharge: 2018-04-12 | Disposition: A | Discharge status: Home / Self Care | Payer: BLUE CROSS/BLUE SHIELD | Attending: Emergency Medicine | Admitting: Emergency Medicine

## 2018-04-12 DIAGNOSIS — Z79899 Other long term (current) drug therapy: Secondary | ICD-10-CM | POA: Diagnosis not present

## 2018-04-12 DIAGNOSIS — R1032 Left lower quadrant pain: Secondary | ICD-10-CM

## 2018-04-12 LAB — CBC WITH DIFFERENTIAL/PLATELET
Basophils Absolute: 0 10*3/uL (ref 0.0–0.1)
Basophils Relative: 0 %
Eosinophils Absolute: 0.1 10*3/uL (ref 0.0–0.7)
Eosinophils Relative: 2 %
HCT: 42.1 % (ref 36.0–46.0)
Hemoglobin: 14.2 g/dL (ref 12.0–15.0)
Lymphocytes Relative: 23 %
Lymphs Abs: 1.9 10*3/uL (ref 0.7–4.0)
MCH: 29.3 pg (ref 26.0–34.0)
MCHC: 33.7 g/dL (ref 30.0–36.0)
MCV: 86.8 fL (ref 78.0–100.0)
Monocytes Absolute: 0.6 10*3/uL (ref 0.1–1.0)
Monocytes Relative: 7 %
Neutro Abs: 5.7 10*3/uL (ref 1.7–7.7)
Neutrophils Relative %: 68 %
Platelets: 250 10*3/uL (ref 150–400)
RBC: 4.85 MIL/uL (ref 3.87–5.11)
RDW: 13.3 % (ref 11.5–15.5)
WBC: 8.3 10*3/uL (ref 4.0–10.5)

## 2018-04-12 LAB — WET PREP, GENITAL
Clue Cells Wet Prep HPF POC: NONE SEEN
Sperm: NONE SEEN
Trich, Wet Prep: NONE SEEN
Yeast Wet Prep HPF POC: NONE SEEN

## 2018-04-12 LAB — BASIC METABOLIC PANEL
Anion gap: 12 (ref 5–15)
BUN: 11 mg/dL (ref 6–20)
CO2: 23 mmol/L (ref 22–32)
Calcium: 9.9 mg/dL (ref 8.9–10.3)
Chloride: 101 mmol/L (ref 101–111)
Creatinine, Ser: 0.78 mg/dL (ref 0.44–1.00)
GFR calc Af Amer: >60 mL/min (ref >60)
GFR calc non Af Amer: >60 mL/min (ref >60)
Glucose, Bld: 89 mg/dL (ref 65–99)
Potassium: 4 mmol/L (ref 3.5–5.1)
Sodium: 136 mmol/L (ref 135–145)

## 2018-04-12 LAB — POC URINE PREG, ED: Preg Test, Ur: NEGATIVE

## 2018-04-12 MED ORDER — HYDROCODONE-ACETAMINOPHEN 5-325 MG PO TABS: 1.0000 | ORAL_TABLET | Freq: Four times a day (QID) | ORAL | 0 refills | Status: DC | PRN

## 2018-04-12 NOTE — ED Triage Notes (Signed)
Pt. Stated, Im having pain in my left hip pain and I might have a cyst. I have an Neuroring, inserted and remove it every 3rd cycle. This one was inserted the 27th APril

## 2018-04-12 NOTE — Discharge Instructions (Addendum)
Your lab work and ultrasound are normal today. You can take ibuprofen/advil every 6 hours as needed for pain. Drink plenty of water. You can take hydrocodone as needed only for severe pain. Follow up with your primary care provider/ OB/Gyn if symptoms persist. Return to the ER for fever, or severely worsening abdominal pain.

## 2018-04-12 NOTE — ED Provider Notes (Addendum)
[facility] EMERGENCY DEPARTMENT Provider Note   CSN: [id number] Arrival date & time: 04/12/18  1122     History   Chief Complaint Chief Complaint  Patient presents with  . Hip Pain  . Pelvic Pain    HPI Julia Hansen is a 32 y.o. female w PMHx of ovarian cysts, anxiety, presenting to the ED with gradual onset of worsening LLQ abdominal/pelvic pain with radiation to the left groin. Pain began a few days ago. She was evaluated by her OB/Gyn who did a pelvic exam which she states was normal.  Patient reports her gynecologist was unable to schedule her for a timely outpatient ultrasound to evaluate for possible recurrence of ovarian cyst.  Patient states she is about to go out town for multiple weeks, part of her trip will be out of the country.  She states she would like evaluation with ultrasound to confirm possible recurrence of cyst.  Patient states pain feels very similar to previous cysts, dull and aching with intermittent sharp pain.  Pain worsened today.  She has been taking over-the-counter medications.  Denies fever, nausea, vomiting, diarrhea, constipation, vaginal bleeding or discharge, urinary symptoms.  No history of abdominal surgery.  No history of diverticulitis.  The history is provided by the patient.    Past Medical History:  Diagnosis Date  . Anxiety   . History of fainting   . Panic attacks   . 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     Patient Active Problem List   Diagnosis Date Noted  . Nonallopathic lesion of cervical region 01/23/2018  . Nonallopathic lesion of thoracic region 01/23/2018  . Nonallopathic lesion of rib cage 01/23/2018  . Cervicogenic headache 01/16/2018  . 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 SVD (spontaneous vaginal delivery) (7/16) 06/24/2016  . Postpartum care following vaginal delivery (7/16) 06/24/2016  . Postpartum state 06/23/2016  . Preterm premature rupture of  membranes 06/21/2016    Past Surgical History:  Procedure Laterality Date  . SHOULDER SURGERY Right   . TONSILLECTOMY    . TONSILLECTOMY    . WISDOM TOOTH EXTRACTION       OB History    Gravida  1   Para  1   Term  1   Preterm      AB      Living  1     SAB      TAB      Ectopic      Multiple  0   Live Births  1            Home Medications    Prior to Admission medications   Medication Sig Start Date End Date Taking? Authorizing Provider  ALPRAZolam Prudy Feeler) 0.5 MG tablet Take 0.5 mg by mouth at bedtime as needed for anxiety.    [provider]  calcium carbonate (TUMS - DOSED IN MG ELEMENTAL CALCIUM) 500 MG chewable tablet Chew 1-2 tablets by mouth 2 (two) times daily as needed for indigestion or heartburn.    [provider]  cyclobenzaprine (FLEXERIL) 10 MG tablet Take 1 tablet (10 mg total) by mouth 2 (two) times daily as needed for muscle spasms. 11/04/17   McDonald, Mia A, PA-C  Docosahexaenoic Acid (DHA OMEGA 3 PO) Take 1 capsule by mouth 2 (two) times daily.    [provider]  etonogestrel-ethinyl estradiol (NUVARING) 0.12-0.015 MG/24HR vaginal ring Place 1 each vaginally every 28 (twenty-eight) days.  Insert vaginally and leave in place for 3 consecutive weeks, then remove for 1 week. 09/02/17   Genia Del, MD  fluticasone (FLONASE) 50 MCG/ACT nasal spray Place 2 sprays into both nostrils daily. 01/02/17   Forcucci, Courtney, PA-C  HYDROcodone-acetaminophen (NORCO/VICODIN) 5-325 MG tablet Take 1 tablet by mouth every 6 (six) hours as needed for severe pain. 04/12/18   Rosaire Cueto, [place] N, PA-C  Multiple Vitamins-Minerals (MULTIVITAMIN WITH MINERALS) tablet Take 1 tablet by mouth daily.    [provider]  sertraline (ZOLOFT) 25 MG tablet Take 1-2 tablets (25-50 mg total) by mouth daily. Take 1 tab alternating with 2 every other day for 14 days. After that period  take 50 mg daily. 08/14/17   Dohmeier, Porfirio Mylar, MD     Family History Family History  Problem Relation Age of Onset  . Hypertension Mother     Social History Social History   Tobacco Use  . Smoking status: Never Smoker  . Smokeless tobacco: Never Used  Substance Use Topics  . Alcohol use: No    Frequency: Never  . Drug use: No     Allergies   Trazodone and nefazodone and Prednisone   Review of Systems Review of Systems  Constitutional: Negative for fever.  Gastrointestinal: Positive for abdominal pain. Negative for constipation, diarrhea, nausea and vomiting.  Genitourinary: Negative for dysuria, frequency, vaginal bleeding and vaginal discharge.  All other systems reviewed and are negative.    Physical Exam Updated Vital Signs BP 112/78 (BP Location: Right Arm)   Pulse 84   Temp 99.6 F (37.6 C) (Oral)   Resp 16   LMP 03/08/2018   SpO2 97%   Physical Exam  Constitutional: She appears well-developed and well-nourished. No distress.  Well-appearing  HENT:  Head: Normocephalic and atraumatic.  Mouth/Throat: Oropharynx is clear and moist.  Eyes: Conjunctivae are normal.  Cardiovascular: Normal rate, regular rhythm, normal heart sounds and intact distal pulses.  Pulmonary/Chest: Effort normal and breath sounds normal. No respiratory distress.  Abdominal: Soft. Bowel sounds are normal. She exhibits no distension. There is no tenderness. There is no rebound and no guarding.  Genitourinary: Vagina normal. There is no rash or tenderness on the right labia. There is no rash or tenderness on the left labia. Cervix exhibits no motion tenderness. Right adnexum displays no mass, no tenderness and no fullness. Left adnexum displays no mass, no tenderness and no fullness.  Genitourinary Comments: Exam performed with female RN chaperone present. Cervix is friable with small amount of white discharge. Nuvaring palpated.  Neurological: She is alert.  Skin: Skin is warm.  Psychiatric: She has a normal mood and affect. Her  behavior is normal.  Nursing note and vitals reviewed.    ED Treatments / Results  Labs (all labs ordered are listed, but only abnormal results are displayed) Labs Reviewed  WET PREP, GENITAL - Abnormal; Notable for the following components:      Result Value   WBC, Wet Prep HPF POC MODERATE (*)    All other components within normal limits  CBC WITH DIFFERENTIAL/PLATELET  BASIC METABOLIC PANEL  POC URINE PREG, ED  GC/CHLAMYDIA PROBE AMP ([place]) NOT AT [facility]    EKG None  Radiology US Transvaginal Non-ob  Result Date: 04/12/2018 CLINICAL DATA:  Left lower quadrant abdominal pain.  Pelvic pain. EXAM: TRANSABDOMINAL AND TRANSVAGINAL ULTRASOUND OF PELVIS DOPPLER ULTRASOUND OF OVARIES TECHNIQUE: Both transabdominal and transvaginal ultrasound examinations of the pelvis were performed. Transabdominal technique was performed for global imaging of  the pelvis including uterus, ovaries, adnexal regions, and pelvic cul-de-sac. It was necessary to proceed with endovaginal exam following the transabdominal exam to visualize the adnexa. Color and duplex Doppler ultrasound was utilized to evaluate blood flow to the ovaries. COMPARISON:  Pelvic ultrasound 04/09/2017. FINDINGS: Uterus Measurements: 6.1 x 2.5 x 2.8 cm, within normal limits. No fibroids or other mass visualized. Endometrium Thickness: 2 mm, within normal limits. No focal abnormality visualized. Right ovary Measurements: 1.9 x 1.0 x 1.5 cm, within normal limits. Normal appearance/no adnexal mass. Left ovary Measurements: 1.7 x 0.8 x 1.0 cm, within normal limits. Normal appearance/no adnexal mass. Pulsed Doppler evaluation of both ovaries demonstrates normal low-resistance arterial and venous waveforms. Other findings No abnormal free fluid. IMPRESSION: 1. Normal sonographic appearance of the uterus and adnexa bilaterally. 2. Normal pulsed Doppler evaluation of both ovaries without evidence for torsion or vascular compromise. Electronically  Signed   By: Marin Roberts M.D.   On: 04/12/2018 15:40   US Pelvis Complete  Result Date: 04/12/2018 CLINICAL DATA:  Left lower quadrant abdominal pain.  Pelvic pain. EXAM: TRANSABDOMINAL AND TRANSVAGINAL ULTRASOUND OF PELVIS DOPPLER ULTRASOUND OF OVARIES TECHNIQUE: Both transabdominal and transvaginal ultrasound examinations of the pelvis were performed. Transabdominal technique was performed for global imaging of the pelvis including uterus, ovaries, adnexal regions, and pelvic cul-de-sac. It was necessary to proceed with endovaginal exam following the transabdominal exam to visualize the adnexa. Color and duplex Doppler ultrasound was utilized to evaluate blood flow to the ovaries. COMPARISON:  Pelvic ultrasound 04/09/2017. FINDINGS: Uterus Measurements: 6.1 x 2.5 x 2.8 cm, within normal limits. No fibroids or other mass visualized. Endometrium Thickness: 2 mm, within normal limits. No focal abnormality visualized. Right ovary Measurements: 1.9 x 1.0 x 1.5 cm, within normal limits. Normal appearance/no adnexal mass. Left ovary Measurements: 1.7 x 0.8 x 1.0 cm, within normal limits. Normal appearance/no adnexal mass. Pulsed Doppler evaluation of both ovaries demonstrates normal low-resistance arterial and venous waveforms. Other findings No abnormal free fluid. IMPRESSION: 1. Normal sonographic appearance of the uterus and adnexa bilaterally. 2. Normal pulsed Doppler evaluation of both ovaries without evidence for torsion or vascular compromise. Electronically Signed   By: Marin Roberts M.D.   On: 04/12/2018 15:40   [place] Art/ven Flow Abd Pelv Doppler  Result Date: 04/12/2018 CLINICAL DATA:  Left lower quadrant abdominal pain.  Pelvic pain. EXAM: TRANSABDOMINAL AND TRANSVAGINAL ULTRASOUND OF PELVIS DOPPLER ULTRASOUND OF OVARIES TECHNIQUE: Both transabdominal and transvaginal ultrasound examinations of the pelvis were performed. Transabdominal technique was performed for global imaging of the  pelvis including uterus, ovaries, adnexal regions, and pelvic cul-de-sac. It was necessary to proceed with endovaginal exam following the transabdominal exam to visualize the adnexa. Color and duplex Doppler ultrasound was utilized to evaluate blood flow to the ovaries. COMPARISON:  Pelvic ultrasound 04/09/2017. FINDINGS: Uterus Measurements: 6.1 x 2.5 x 2.8 cm, within normal limits. No fibroids or other mass visualized. Endometrium Thickness: 2 mm, within normal limits. No focal abnormality visualized. Right ovary Measurements: 1.9 x 1.0 x 1.5 cm, within normal limits. Normal appearance/no adnexal mass. Left ovary Measurements: 1.7 x 0.8 x 1.0 cm, within normal limits. Normal appearance/no adnexal mass. Pulsed Doppler evaluation of both ovaries demonstrates normal low-resistance arterial and venous waveforms. Other findings No abnormal free fluid. IMPRESSION: 1. Normal sonographic appearance of the uterus and adnexa bilaterally. 2. Normal pulsed Doppler evaluation of both ovaries without evidence for torsion or vascular compromise. Electronically Signed   By: Marin Roberts M.D.   On:  04/12/2018 15:40    Procedures Procedures (including critical care time)  Medications Ordered in ED Medications - No data to display   Initial Impression / Assessment and Plan / ED Course  I have reviewed the triage vital signs and the nursing notes.  Pertinent labs & imaging results that were available during my care of the patient were reviewed by me and considered in my medical decision making (see chart for details).     Pt with history of ovarian cyst, presenting to the ED with left lower quadrant abdominal pain.  No associated abdominal symptoms, no vaginal bleeding or discharge.  Patient is well-appearing and nondistressed.  Presented to the ED requesting ultrasound, as she is going on a long vacation out of the country.  Pelvic exam with friable cervix, however otherwise unremarkable.  No tenderness or  fullness.  Negative urine pregnant.  Pelvic and transvaginal ultrasound is normal.  CBC and BMP are normal.  Discussed reassuring results with patient.  Low suspicion for acute intra-abdominal pathology as cause of symptoms.  Discussed oral hydration and symptomatic management. Strict return precautions discussed.  [place] Controlled Substance reporting System queried  Discussed results, findings, treatment and follow up. Patient advised of return precautions. Patient verbalized understanding and agreed with plan.  Final Clinical Impressions(s) / ED Diagnoses   Final diagnoses:  Abdominal pain, LLQ (left lower quadrant)    ED Discharge Orders        Ordered    HYDROcodone-acetaminophen (NORCO/VICODIN) 5-325 MG tablet  Every 6 hours PRN     04/12/18 1619       Allyson Tineo, [place] N, PA-C 04/12/18 1624    Lekendrick Alpern, [place] N, [place] 04/12/18 1624    Mancel Bale, MD 04/12/18 2201

## 2018-04-13 LAB — GC/CHLAMYDIA PROBE AMP (~~LOC~~) NOT AT ARMC
Chlamydia: NEGATIVE
Neisseria Gonorrhea: NEGATIVE

## 2018-05-02 ENCOUNTER — Other Ambulatory Visit: Payer: Self-pay | Admitting: Obstetrics & Gynecology

## 2018-05-05 ENCOUNTER — Ambulatory Visit: Payer: BLUE CROSS/BLUE SHIELD | Admitting: Obstetrics & Gynecology

## 2018-05-05 ENCOUNTER — Other Ambulatory Visit: Payer: BLUE CROSS/BLUE SHIELD

## 2018-05-05 NOTE — Telephone Encounter (Signed)
Annual scheduled on 07/24/18

## 2018-07-24 ENCOUNTER — Ambulatory Visit (INDEPENDENT_AMBULATORY_CARE_PROVIDER_SITE_OTHER): Payer: BLUE CROSS/BLUE SHIELD | Admitting: Obstetrics & Gynecology

## 2018-07-24 ENCOUNTER — Encounter: Payer: Self-pay | Admitting: Obstetrics & Gynecology

## 2018-07-24 VITALS — BP 116/70 | Ht 61.5 in | Wt 161.0 lb

## 2018-07-24 DIAGNOSIS — Z01419 Encounter for gynecological examination (general) (routine) without abnormal findings: Secondary | ICD-10-CM | POA: Diagnosis not present

## 2018-07-24 DIAGNOSIS — Z3044 Encounter for surveillance of vaginal ring hormonal contraceptive device: Secondary | ICD-10-CM | POA: Diagnosis not present

## 2018-07-24 MED ORDER — ETONOGESTREL-ETHINYL ESTRADIOL 0.12-0.015 MG/24HR VA RING
VAGINAL_RING | VAGINAL | 4 refills | Status: DC
Start: 2018-07-24 — End: 2019-07-27

## 2018-07-24 NOTE — Progress Notes (Signed)
Julia Hansen Julia Hansen 12/11/1985 [id number]   History:    32 y.o. G1P1L1 Married.  Son, Julia Hansen is 34 yo.  RP:  Established patient presenting for annual gyn exam   HPI: Well on Nuvaring.  No breakthrough bleeding.  No pelvic pain.  Normal vaginal secretions.  No pain with intercourse.  Urine normal.  Rare episodes of constipation with bloatedness.  Breasts normal.  Body mass index 29.93.  Exercising regularly and eating well.  Past medical history,surgical history, family history and social history were all reviewed and documented in the EPIC chart.  Gynecologic History Patient's last menstrual period was 06/26/2018. Contraception: NuvaRing vaginal inserts Last Pap: 12/2015. Results were: Negative/HPV HR neg Last mammogram: Never Bone Density: Never Colonoscopy: Never  Obstetric History OB History  Gravida Para Term Preterm AB Living  1 1 1     1   SAB TAB Ectopic Multiple Live Births        0 1    # Outcome Date GA Lbr Len/2nd Weight Sex Delivery Anes PTL Lv  1 Term 06/23/16 [id number] 33:06 / 02:06 7 lb 15 oz (3.6 kg) M Vag-Spont EPI  LIV     ROS: A ROS was performed and pertinent positives and negatives are included in the history.  GENERAL: No fevers or chills. HEENT: No change in vision, no earache, sore throat or sinus congestion. NECK: No pain or stiffness. CARDIOVASCULAR: No chest pain or pressure. No palpitations. PULMONARY: No shortness of breath, cough or wheeze. GASTROINTESTINAL: No abdominal pain, nausea, vomiting or diarrhea, melena or bright red blood per rectum. GENITOURINARY: No urinary frequency, urgency, hesitancy or dysuria. MUSCULOSKELETAL: No joint or muscle pain, no back pain, no recent trauma. DERMATOLOGIC: No rash, no itching, no lesions. ENDOCRINE: No polyuria, polydipsia, no heat or cold intolerance. No recent change in weight. HEMATOLOGICAL: No anemia or easy bruising or bleeding. NEUROLOGIC: No headache, seizures, numbness, tingling or weakness. PSYCHIATRIC: No  depression, no loss of interest in normal activity or change in sleep pattern.     Exam:   BP 116/70   Ht 5' 1.5" (1.562 m)   Wt 161 lb (73 kg)   LMP 06/26/2018 Comment: nuvaring   BMI 29.93 kg/m   Body mass index is 29.93 kg/m.  General appearance : Well developed well nourished female. No acute distress HEENT: Eyes: no retinal hemorrhage or exudates,  Neck supple, trachea midline, no carotid bruits, no thyroidmegaly Lungs: Clear to auscultation, no rhonchi or wheezes, or rib retractions  Heart: Regular rate and rhythm, no murmurs or gallops Breast:Examined in sitting and supine position were symmetrical in appearance, no palpable masses or tenderness,  no skin retraction, no nipple inversion, no nipple discharge, no skin discoloration, no axillary or supraclavicular lymphadenopathy Abdomen: no palpable masses or tenderness, no rebound or guarding Extremities: no edema or skin discoloration or tenderness  Pelvic: Vulva: Normal             Vagina: No gross lesions or discharge  Cervix: No gross lesions or discharge.  Pap reflex done.  Uterus  AV, normal size, shape and consistency, non-tender and mobile  Adnexa  Without masses or tenderness  Anus: Normal   Assessment/Plan:  32 y.o. female for annual exam   1. Encounter for gynecological examination with Papanicolaou smear of cervix Normal gynecologic exam.  Pap reflex done today.  Breast exam normal. - Pap IG Julia/ reflex to HPV when ASC-U  2. Encounter for surveillance of vaginal ring hormonal contraceptive device Well on NuvaRing.  No contraindication to continue.  Prescription sent to pharmacy.  Other orders - etonogestrel-ethinyl estradiol (NUVARING) 0.12-0.015 MG/24HR vaginal ring; USE 1 RING INTRAVAGINALLY FOR 3 WEEKS AS DIRECTED  Julia DelMarie-Lyne Kalla Watson MD, 2:42 PM 07/24/2018

## 2018-07-24 NOTE — Patient Instructions (Signed)
1. Encounter for gynecological examination with Papanicolaou smear of cervix Normal gynecologic exam.  Pap reflex done today.  Breast exam normal. - Pap IG w/ reflex to HPV when ASC-U  2. Encounter for surveillance of vaginal ring hormonal contraceptive device Well on NuvaRing.  No contraindication to continue.  Prescription sent to pharmacy.  Other orders - etonogestrel-ethinyl estradiol (NUVARING) 0.12-0.015 MG/24HR vaginal ring; USE 1 RING INTRAVAGINALLY FOR 3 WEEKS AS DIRECTED  Julia Hansen, it was a pleasure seeing you today!  I will inform you of your results as soon as they are available.

## 2018-07-27 LAB — PAP IG W/ RFLX HPV ASCU

## 2018-07-29 ENCOUNTER — Emergency Department (HOSPITAL_COMMUNITY): Payer: BLUE CROSS/BLUE SHIELD

## 2018-07-29 ENCOUNTER — Emergency Department (HOSPITAL_COMMUNITY)
Admission: EM | Admit: 2018-07-29 | Discharge: 2018-07-29 | Disposition: A | Discharge status: Home / Self Care | Payer: BLUE CROSS/BLUE SHIELD | Attending: Emergency Medicine | Admitting: Emergency Medicine

## 2018-07-29 ENCOUNTER — Encounter (HOSPITAL_COMMUNITY): Payer: Self-pay

## 2018-07-29 DIAGNOSIS — Z79899 Other long term (current) drug therapy: Secondary | ICD-10-CM | POA: Insufficient documentation

## 2018-07-29 DIAGNOSIS — R109 Unspecified abdominal pain: Secondary | ICD-10-CM | POA: Diagnosis present

## 2018-07-29 LAB — COMPREHENSIVE METABOLIC PANEL
ALT: 26 U/L (ref 0–44)
AST: 24 U/L (ref 15–41)
Albumin: 3.8 g/dL (ref 3.5–5.0)
Alkaline Phosphatase: 40 U/L (ref 38–126)
Anion gap: 9 (ref 5–15)
BUN: 8 mg/dL (ref 6–20)
CO2: 22 mmol/L (ref 22–32)
Calcium: 9.5 mg/dL (ref 8.9–10.3)
Chloride: 106 mmol/L (ref 98–111)
Creatinine, Ser: 0.83 mg/dL (ref 0.44–1.00)
GFR calc Af Amer: >60 mL/min (ref >60)
GFR calc non Af Amer: >60 mL/min (ref >60)
Glucose, Bld: 96 mg/dL (ref 70–99)
Potassium: 4.2 mmol/L (ref 3.5–5.1)
Sodium: 137 mmol/L (ref 135–145)
Total Bilirubin: 0.7 mg/dL (ref 0.3–1.2)
Total Protein: 7.4 g/dL (ref 6.5–8.1)

## 2018-07-29 LAB — URINALYSIS, ROUTINE W REFLEX MICROSCOPIC
Bilirubin Urine: NEGATIVE
Glucose, UA: NEGATIVE mg/dL
Ketones, ur: NEGATIVE mg/dL
Leukocytes, UA: NEGATIVE
Nitrite: NEGATIVE
Protein, ur: NEGATIVE mg/dL
Specific Gravity, Urine: 1.012 (ref 1.005–1.030)
pH: 8 (ref 5.0–8.0)

## 2018-07-29 LAB — I-STAT BETA HCG BLOOD, ED (MC, WL, AP ONLY): I-stat hCG, quantitative: <5 m[IU]/mL (ref <5)

## 2018-07-29 LAB — CBC
HCT: 43.9 % (ref 36.0–46.0)
Hemoglobin: 14.5 g/dL (ref 12.0–15.0)
MCH: 29.4 pg (ref 26.0–34.0)
MCHC: 33 g/dL (ref 30.0–36.0)
MCV: 88.9 fL (ref 78.0–100.0)
Platelets: 231 10*3/uL (ref 150–400)
RBC: 4.94 MIL/uL (ref 3.87–5.11)
RDW: 13 % (ref 11.5–15.5)
WBC: 7 10*3/uL (ref 4.0–10.5)

## 2018-07-29 LAB — I-STAT CG4 LACTIC ACID, ED: Lactic Acid, Venous: 1.27 mmol/L (ref 0.5–1.9)

## 2018-07-29 LAB — LIPASE, BLOOD: Lipase: 41 U/L (ref 11–51)

## 2018-07-29 MED ORDER — DICYCLOMINE HCL 20 MG PO TABS: 20.0000 mg | ORAL_TABLET | Freq: Two times a day (BID) | ORAL | 0 refills | Status: DC

## 2018-07-29 MED ORDER — DICYCLOMINE HCL 10 MG/ML IM SOLN
20.0000 mg | Freq: Once | INTRAMUSCULAR | Status: AC
  Administered 2018-07-29: 20 mg via INTRAMUSCULAR
  Filled 2018-07-29: qty 2

## 2018-07-29 MED ORDER — DICLOFENAC SODIUM 1 % TD GEL: 4.0000 g | Freq: Four times a day (QID) | TRANSDERMAL | 0 refills | Status: DC

## 2018-07-29 MED ORDER — SODIUM CHLORIDE 0.9 % IV BOLUS
1000.0000 mL | Freq: Once | INTRAVENOUS | Status: AC
  Administered 2018-07-29: 1000 mL via INTRAVENOUS

## 2018-07-29 MED ORDER — GI COCKTAIL ~~LOC~~
30.0000 mL | Freq: Once | ORAL | Status: AC
  Administered 2018-07-29: 30 mL via ORAL
  Filled 2018-07-29: qty 30

## 2018-07-29 MED ORDER — SUCRALFATE 1 GM/10ML PO SUSP: 1.0000 g | Freq: Three times a day (TID) | ORAL | 0 refills | Status: DC

## 2018-07-29 NOTE — ED Provider Notes (Addendum)
Patient placed in Quick Look pathway, seen and evaluated   Chief Complaint: Abdominal pain and bloating  HPI:   Patient states that her abdominal pain and bloating began approximately 2 weeks ago.  Pain is in the epigastric area, pain and bloating both worsened last night.  Patient denies fevers, endorses nausea with no vomiting.  Patient has been using stool softeners, last movement this morning was soft, denies change in stool color.  Patient states she has been passing gas as normal.  Patient states that she has had similar symptoms at the beginning of this year, was seen by her primary care doctor who told her she most likely had irritable bowel syndrome.  ROS: Constitutional: Negative for fevers/chills Abdominal: Positive for epigastric pain, abdominal distention, nausea.  Negative vomiting, diarrhea, change in stool color.  Physical Exam:   Gen: No distress, well-appearing, slightly anxious  Neuro: Awake and Alert  Skin: Warm, not diaphoretic    Focused Exam: Abdomen is slightly distended, tenderness to palpation of the epigastric area.  No rebound tenderness.  No McBurney's point tenderness, negative Murphy sign.   Initiation of care has begun. The patient has been counseled on the process, plan, and necessity for staying for the completion/evaluation, and the remainder of the medical screening examination    Elizabeth PalauMorelli, Ladaisha Portillo A, PA-C 07/29/18 1132    Bill SalinasMorelli, Lakara Weiland A, PA-C 07/29/18 1132    Tegeler, Canary Brimhristopher J, MD 07/29/18 [phone]

## 2018-07-29 NOTE — ED Provider Notes (Signed)
[facility] EMERGENCY DEPARTMENT Provider Note   CSN: [id number] Arrival date & time: 07/29/18  1111     History   Chief Complaint Chief Complaint  Patient presents with  . Abdominal Pain    HPI Bunnie PhilipsMonica W Jewell is a 32 y.o. female with a history of anxiety who presents emergency department today for abdominal bloating.  Patient reports that she has been dealing with this on and off over the last 1 year.  She reports that she will have abdominal bloating, generalized cramping and constipation that hits her every few months.  She is seen [facility] for this and has been told she has IBS.  She reports she is taking stool softeners, probiotics for this without any relief.  She was tested for gluten allergy that was negative.  She has received multiple work-ups in the past 1 year for this with reassuring imaging and blood work.  She reports that she has no focal abdominal pain.  She reports she has been passing gas as normal.  She reports she has 1-2 normal bowel movements per day.  She denies any melena or hematochezia.  She denies any fever, chills, pelvic pain, vaginal discharge, emesis, diarrhea, urinary symptoms.  She was seen by her gynecologist this week and had a reassuring pelvic exam as well as ultrasound.  She is to follow-up with [facility] tomorrow for further evaluation of this.  No previous abdominal surgeries. She reports she does use NSAIDs frequently rotator cuff injury. No alcohol use.   HPI  Past Medical History:  Diagnosis Date  . Anxiety   . History of fainting   . Panic attacks   . 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     Patient Active Problem List   Diagnosis Date Noted  . Nonallopathic lesion of cervical region 01/23/2018  . Nonallopathic lesion of thoracic region 01/23/2018  . Nonallopathic lesion of rib cage 01/23/2018  . Cervicogenic headache 01/16/2018  . 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 SVD  (spontaneous vaginal delivery) (7/16) 06/24/2016  . Postpartum care following vaginal delivery (7/16) 06/24/2016  . Postpartum state 06/23/2016  . Preterm premature rupture of membranes 06/21/2016    Past Surgical History:  Procedure Laterality Date  . SHOULDER SURGERY Right   . TONSILLECTOMY    . TONSILLECTOMY    . WISDOM TOOTH EXTRACTION       OB History    Gravida  1   Para  1   Term  1   Preterm      AB      Living  1     SAB      TAB      Ectopic      Multiple  0   Live Births  1            Home Medications    Prior to Admission medications   Medication Sig Start Date End Date Taking? Authorizing Provider  ALPRAZolam Prudy Feeler(XANAX) 0.5 MG tablet Take 0.5 mg by mouth at bedtime as needed for anxiety.   Yes [provider]  calcium carbonate (TUMS - DOSED IN MG ELEMENTAL CALCIUM) 500 MG chewable tablet Chew 1-2 tablets by mouth 2 (two) times daily as needed for indigestion or heartburn.   Yes [provider]  cyclobenzaprine (FLEXERIL) 10 MG tablet Take 1 tablet (10 mg total) by mouth 2 (two) times daily as needed for muscle spasms. 11/04/17  Yes McDonald,  Mia A, PA-C  Docosahexaenoic Acid (DHA OMEGA 3 PO) Take 1 capsule by mouth 2 (two) times daily.   Yes [provider]  etonogestrel-ethinyl estradiol (NUVARING) 0.12-0.015 MG/24HR vaginal ring USE 1 RING INTRAVAGINALLY FOR 3 WEEKS AS DIRECTED 07/24/18  Yes Genia Del, MD  Multiple Vitamins-Minerals (MULTIVITAMIN WITH MINERALS) tablet Take 1 tablet by mouth daily.   Yes [provider]  sertraline (ZOLOFT) 25 MG tablet Take 1-2 tablets (25-50 mg total) by mouth daily. Take 1 tab alternating with 2 every other day for 14 days. After that period  take 50 mg daily. Patient taking differently: Take 25 mg by mouth daily.  08/14/17  Yes Dohmeier, Porfirio Mylar, MD    Family History Family History  Problem Relation Age of Onset  . Hypertension Mother     Social History Social  History   Tobacco Use  . Smoking status: Never Smoker  . Smokeless tobacco: Never Used  Substance Use Topics  . Alcohol use: No    Frequency: Never  . Drug use: No     Allergies   Trazodone and nefazodone and Prednisone   Review of Systems Review of Systems  All other systems reviewed and are negative.    Physical Exam Updated Vital Signs BP (!) 123/95 (BP Location: Right Arm)   Pulse (!) 114   Temp 99 F (37.2 C) (Oral)   Resp 18   Ht 5' 1.5" (1.562 m)   Wt 72.6 kg   LMP 06/27/2018 (Approximate) Comment: nuvaring  SpO2 98%   BMI 29.74 kg/m   Physical Exam  Constitutional: She appears well-developed and well-nourished.  HENT:  Head: Normocephalic and atraumatic.  Right Ear: External ear normal.  Left Ear: External ear normal.  Nose: Nose normal.  Mouth/Throat: Uvula is midline, oropharynx is clear and moist and mucous membranes are normal. No tonsillar exudate.  Eyes: Pupils are equal, round, and reactive to light. Right eye exhibits no discharge. Left eye exhibits no discharge. No scleral icterus.  Neck: Trachea normal. Neck supple. No spinous process tenderness present. No neck rigidity. Normal range of motion present.  Cardiovascular: Normal rate, regular rhythm and intact distal pulses.  No murmur heard. Pulses:      Radial pulses are 2+ on the right side, and 2+ on the left side.       Dorsalis pedis pulses are 2+ on the right side, and 2+ on the left side.       Posterior tibial pulses are 2+ on the right side, and 2+ on the left side.  No lower extremity swelling or edema. Calves symmetric in size bilaterally.  Pulmonary/Chest: Effort normal and breath sounds normal. She exhibits no tenderness.  Abdominal: Soft. Bowel sounds are normal. She exhibits no distension. There is no tenderness. There is no rigidity, no rebound, no guarding, no CVA tenderness, no tenderness at McBurney's point and negative Murphy's sign.  Musculoskeletal: She exhibits no edema.    Lymphadenopathy:    She has no cervical adenopathy.  Neurological: She is alert.  Skin: Skin is warm, dry and intact. Capillary refill takes less than 2 seconds. No rash noted. She is not diaphoretic.  Psychiatric: She has a normal mood and affect.  Nursing note and vitals reviewed.    ED Treatments / Results  Labs (all labs ordered are listed, but only abnormal results are displayed) Labs Reviewed  URINALYSIS, ROUTINE W REFLEX MICROSCOPIC - Abnormal; Notable for the following components:      Result Value   Hgb  urine dipstick LARGE (*)    Bacteria, UA RARE (*)    All other components within normal limits  LIPASE, BLOOD  COMPREHENSIVE METABOLIC PANEL  CBC  I-STAT BETA HCG BLOOD, ED (MC, WL, AP ONLY)  I-STAT CG4 LACTIC ACID, ED    EKG None  Radiology Dg Abd Acute W/chest  Result Date: 07/29/2018 CLINICAL DATA:  Bloating and abdominal pain. EXAM: DG ABDOMEN ACUTE W/ 1V CHEST COMPARISON:  12/21/2017 FINDINGS: The lungs are clear without focal pneumonia, edema, pneumothorax or pleural effusion. The cardiopericardial silhouette is within normal limits for size. The visualized bony structures of the thorax are intact. Upright film shows no evidence for intraperitoneal free air. There is no evidence for gaseous bowel dilation to suggest obstruction. Moderate stool volume diffusely. Thoracolumbar scoliosis evident. IMPRESSION: 1. No acute cardiopulmonary finding. 2. No bowel obstruction or perforation.  Average stool volume. 3. Thoracolumbar scoliosis Electronically Signed   By: Kennith Center M.D.   On: 07/29/2018 12:17    Procedures Procedures (including critical care time)  Medications Ordered in ED Medications  dicyclomine (BENTYL) injection 20 mg (has no administration in time range)  gi cocktail (Maalox,Lidocaine,Donnatal) (has no administration in time range)  sodium chloride 0.9 % bolus 1,000 mL (1,000 mLs Intravenous New Bag/Given 07/29/18 1230)     Initial Impression /  Assessment and Plan / ED Course  I have reviewed the triage vital signs and the nursing notes.  Pertinent labs & imaging results that were available during my care of the patient were reviewed by me and considered in my medical decision making (see chart for details).     32 y.o. female with a history of anxiety who presents emergency department today for abdominal bloating.  Patient reports that she has been dealing with this on and off over the last 1 year.  She reports that she will have abdominal bloating, generalized cramping and constipation that hits her every few months.  She is seen [facility] for this and has been told she has IBS.  She reports she is taking stool softeners, probiotics for this without any relief.  She was tested for gluten allergy that was negative.  She has received multiple work-ups in the past 1 year for this with reassuring imaging and blood work.  She reports that she has no focal abdominal pain.  She reports she has been passing gas as normal.  She reports she has 1-2 normal bowel movements per day.  She denies any melena or hematochezia.  She denies any fever, chills, pelvic pain, vaginal discharge, emesis, diarrhea, urinary symptoms.  She was seen by her gynecologist this week and had a reassuring pelvic exam as well as ultrasound.  She is to follow-up with [facility] tomorrow for further evaluation of this.  No previous abdominal surgeries. She reports she does use NSAIDs frequently rotator cuff injury. No alcohol use.   No focal abdominal ttp on exam. Patient initially tachycardic on presentation but without fever, tachypnea, hypoxia or hypotension.  She was screened in pit.  Lab work was obtained.  This was unremarkable.  Pregnancy test was negative.  No concern for ectopic.  Lipase within normal limits.  No concern for pancreatitis.  Lactic acid within normal limits.  Heart rate improved after IV fluids.  No concern for sepsis.  Patient is nonseptic appearing.  There is  no significant electrolight  derangements.  No acute kidney injury.  LFTs within normal limits.  No anion gap acidosis.  No evidence of  DKA.  No leukocytosis.  No anemia.  No evidence of UTI.  Abdominal x-ray without evidence of obstruction.  Patient vital signs improved after IV fluid. Pain relieved after bently and GI cocktail. Will tell to avoid NSAIDs. Recommended Tylenol for shoulder. She is to take Voltaren as alternative otherwise. Will start on Bentyl, Carafate and advise Cultrelle probotics.  She is to follow-up with her GI specialist tomorrow.  Repeat abdominal exam without tenderness or peritoneal signs.  No surgical abdomen.  Do not feel patient needs a CT scan at this time.  No indication of appendicitis, bowel obstruction, bowel perforation, cholecystitis, diverticulitis, ectopic pregnancy. I advised the patient to follow-up with Gi this week. Specific return precautions discussed. Time was given for all questions to be answered. The patient verbalized understanding and agreement with plan. The patient appears safe for discharge home.  Final Clinical Impressions(s) / ED Diagnoses   Final diagnoses:  Abdominal cramping    ED Discharge Orders    None       Princella PellegriniMaczis, Chance Munter M, PA-C 07/29/18 1412    Pricilla LovelessGoldston, Scott, MD 07/29/18 [id number]

## 2018-07-29 NOTE — Discharge Instructions (Addendum)
Please read and follow all provided instructions You have been seen today for your complaint of:  Your lab work and imaging was reassuring  Vital signs: See below  Please stop using ibuprofen and other NSAIDs for your shoulder pain.  Please use Tylenol or the Voltaren I prescribed for this.  Please take Carafate as prescribed.  Take Bentyl as needed for abdominal cramping.  You can pick up a Culturelle probiotic over-the-counter and take this daily.  Follow-up with your GI specialist tomorrow during her scheduled appointment.  If you develop any focal pain in your right upper abdomen, right lower abdomen, left lower abdomen, or develop a fever or vomiting please return to the emergency department for further evaluation. If you develop worsening or new concerning symptoms you can return to the emergency department for re-evaluation.   Abdominal Pain  Your exam might not show the exact reason you have abdominal pain. Since there are many different causes of abdominal pain, another checkup and more tests may be needed. It is very important to follow up for lasting (persistent) or worsening symptoms. A possible cause of abdominal pain in any person who still has his or her appendix is acute appendicitis. Appendicitis is often hard to diagnose. Normal blood tests, urine tests, ultrasound, and CT scans do not completely rule out early appendicitis or other causes of abdominal pain. Sometimes, only the changes that happen over time will allow appendicitis and other causes of abdominal pain to be determined. Other potential problems that may require surgery may also take time to become more apparent. Because of this, it is important that you follow all of the instructions below.   HOME CARE INSTRUCTIONS  Do not take laxatives unless directed by your caregiver. Rest as much as possible.  Do not eat solid food until your pain is gone: A diet of water, weak decaffeinated tea, broth or bouillon, gelatin, oral  rehydration solutions (ORS), frozen ice pops, or ice chips may be helpful.  When pain is gone: Start a light diet (dry toast, crackers, applesauce, or white rice). Increase the diet slowly as long as it does not bother you. Eat no dairy products (including cheese and eggs) and no spicy, fatty, fried, or high-fiber foods.  Use no alcohol, caffeine, or cigarettes.  Take your regular medicines unless your caregiver told you not to.  Take any prescribed medicine as directed.   SEEK IMMEDIATE MEDICAL CARE IF:  The pain does not go away.  You have a fever >101 that does not go down with medication. You keep throwing up (vomiting) or cannot drink liquids.  You have shaking chills (rigors) The pain becomes localized (Pain in the right side could possibly be appendicitis. In an adult, pain in the left lower portion of the abdomen could be colitis or diverticulitis). You pass bloody or black tarry stools.  There is bright red blood in the stool.  There is blood in your vomit. Your bowel movements stop (become blocked) or you cannot pass gas.  The constipation stays for more than 4 days.  You have bloody, frequent, or painful urination.  You have yellow discoloration in the skin or whites of the eyes.  Your stomach becomes bloated or bigger.  You have dizziness or fainting.  You have chest or back pain. You have rectal pain.  You do not seem to be getting better.  You have any questions or concerns.   Your vital signs today were: BP 115/87    Pulse 85  Temp 98.9 F (37.2 C) (Oral)    Resp 16    Ht 5' 1.5" (1.562 m)    Wt 72.6 kg    LMP 06/27/2018 (Approximate) Comment: nuvaring   SpO2 95%    BMI 29.74 kg/m  If your blood pressure (bp) was elevated above 135/85 this visit, please have this repeated by your doctor within one month.

## 2018-07-29 NOTE — ED Triage Notes (Signed)
Pt presents for evaluation of abd pain. States is bloating, constipation with LBM today after 2 stool softeners. Pt reports pain recurred last night, nausea.

## 2018-11-10 ENCOUNTER — Other Ambulatory Visit: Payer: Self-pay | Admitting: Gastroenterology

## 2018-11-10 DIAGNOSIS — R1084 Generalized abdominal pain: Secondary | ICD-10-CM

## 2018-11-13 ENCOUNTER — Ambulatory Visit
Admission: RE | Admit: 2018-11-13 | Discharge: 2018-11-13 | Disposition: A | Discharge status: Home / Self Care | Payer: BLUE CROSS/BLUE SHIELD | Source: Ambulatory Visit | Attending: Gastroenterology | Admitting: Gastroenterology

## 2018-11-13 DIAGNOSIS — R1084 Generalized abdominal pain: Secondary | ICD-10-CM

## 2018-11-13 MED ORDER — IOPAMIDOL (ISOVUE-300) INJECTION 61%
100.0000 mL | Freq: Once | INTRAVENOUS | Status: AC | PRN
  Administered 2018-11-13: 100 mL via INTRAVENOUS

## 2019-03-01 ENCOUNTER — Other Ambulatory Visit: Payer: Self-pay | Admitting: Physician Assistant

## 2019-03-01 ENCOUNTER — Other Ambulatory Visit: Payer: Self-pay

## 2019-03-01 ENCOUNTER — Ambulatory Visit
Admission: RE | Admit: 2019-03-01 | Discharge: 2019-03-01 | Disposition: A | Discharge status: Home / Self Care | Payer: BLUE CROSS/BLUE SHIELD | Source: Ambulatory Visit | Attending: Physician Assistant | Admitting: Physician Assistant

## 2019-03-01 DIAGNOSIS — R1013 Epigastric pain: Secondary | ICD-10-CM

## 2019-03-01 DIAGNOSIS — R1900 Intra-abdominal and pelvic swelling, mass and lump, unspecified site: Secondary | ICD-10-CM

## 2019-03-01 DIAGNOSIS — R140 Abdominal distension (gaseous): Secondary | ICD-10-CM

## 2019-04-14 ENCOUNTER — Other Ambulatory Visit: Payer: Self-pay

## 2019-04-14 ENCOUNTER — Ambulatory Visit: Payer: BLUE CROSS/BLUE SHIELD | Admitting: Infectious Diseases

## 2019-04-14 ENCOUNTER — Encounter: Payer: Self-pay | Admitting: Infectious Diseases

## 2019-04-14 VITALS — BP 128/91 | HR 94 | Temp 98.8°F | Wt 147.0 lb

## 2019-04-14 DIAGNOSIS — R140 Abdominal distension (gaseous): Secondary | ICD-10-CM | POA: Diagnosis not present

## 2019-04-14 DIAGNOSIS — Z8619 Personal history of other infectious and parasitic diseases: Secondary | ICD-10-CM

## 2019-04-14 NOTE — Progress Notes (Signed)
Subjective:    Patient ID: Julia Hansen, female    DOB: 03/12/1986, 33 y.o.   MRN: [id number]  HPI The patient is a pleasant 33 year old white female with a history of anxiety disorder and panic attacks presenting today chronic abdominal bloating and transient pain with specific concern for an intestinal parasite.  Patient relays that most of her symptoms began approximately a year and a half ago when she began developing alternating constipation and diarrhea as well as significant complaints of bloating abdominally.  She has seen multiple GI specialists 1 of whom thought she had IBS as does her general practitioner.  She is also seen an OB/GYN to rule out ovarian cyst/endometriosis and other uterine pathology such as fibroids with a reportedly unrevealing work-up per the patient.  She has changed her diet multiple times in an effort to accommodate these issues and bring relief thus far nothing has consistently helped her.  More recently, she went to [place] health [place] in [place] who did not unapproved test telling her that she had a tapeworm infection.  She took these results to her primary care physician who was equally unclear as to what their meaning was as the test ran was not an FDA approved test.  She did have stool sent for ova and parasites, which was negative last month.  Frustrated, she did take 3 days of the medication recommended by the integrative health clinic (Biltricide), which only resulted in patient having more burping than usual otherwise her symptoms have persisted and unchanged.  She has had an EGD with biopsies that were negative for H. pylori and celiac disease and showed only mild chronic inactive gastritis.  She has traveled internationally twice in her lifetime once to [place] and another to the [place] but the most recent of these visits was more than 5 to 6 years ago.  As her symptoms overlap with the delivery of her 35-year-old son temporarily, she did  confess that she often feels overwhelmed being a new mother.  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  She denies any fevers or chills or recent nausea or vomiting.  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    Family History  Problem Relation Age of Onset   Hypertension Mother    COPD Paternal Grandmother    Cancer Paternal Grandfather    Social History   Tobacco Use   Smoking status: Never Smoker   Smokeless tobacco: Never Used  Substance Use Topics   Alcohol use: No    Frequency: Never   Drug use: No    Review of Systems  Constitutional: Positive for fatigue. Negative for chills and fever.  HENT: Negative for congestion, hearing loss and sinus pressure.   Eyes: Negative for photophobia, discharge, redness and visual disturbance.  Respiratory: Negative for apnea, cough, shortness of breath and wheezing.   Cardiovascular: Negative for chest pain and leg swelling.  Gastrointestinal: Positive for abdominal distention, abdominal pain, constipation and diarrhea. Negative for nausea and vomiting.  Endocrine: Negative for cold intolerance, heat intolerance, polydipsia and polyuria.  Genitourinary: Negative for  dysuria, flank pain, frequency, urgency, vaginal bleeding and vaginal discharge.  Musculoskeletal: Negative for arthralgias, back pain, joint swelling and neck pain.  Skin: Positive for rash. Negative for pallor.       transient  Allergic/Immunologic: Negative for immunocompromised state.  Neurological: Negative for dizziness, seizures, speech difficulty, weakness and headaches.  Hematological: Does not bruise/bleed easily.    Psychiatric/Behavioral: Positive for sleep disturbance. Negative for agitation, confusion and hallucinations. The patient is nervous/anxious.    Vitals:   04/14/19 1518  BP: (!) 128/91  Pulse: 94  Temp: 98.8 F (37.1 C)      Objective:   Physical Exam Physical Exam Gen: pleasant, NAD, A&Ox 3 Head: NCAT, no temporal wasting evident EENT: PERRL, EOMI, MMM, adequate dentition Neck: supple, no JVD CV: NRRR, no murmurs evident Pulm: CTA bilaterally, no wheeze or retractions Abd: soft, NTND, +BS Extrems: trace LE edema, 2+ pulses Skin: no rashes, adequate skin turgor Neuro: CN II-XII grossly intact, no focal neurologic deficits appreciated, gait was normal, A&Ox 3   Lab Results  Component Value Date   WBC 7.0 07/29/2018   HGB 14.5 07/29/2018   HCT 43.9 07/29/2018   MCV 88.9 07/29/2018   PLT 231 07/29/2018     Chemistry      Component Value Date/Time   NA 137 07/29/2018 1127   K 4.2 07/29/2018 1127   CL 106 07/29/2018 1127   CO2 22 07/29/2018 1127   BUN 8 07/29/2018 1127   CREATININE 0.83 07/29/2018 1127   CREATININE 0.77 01/02/2017 1613      Component Value Date/Time   CALCIUM 9.5 07/29/2018 1127   ALKPHOS 40 07/29/2018 1127   AST 24 07/29/2018 1127   ALT 26 07/29/2018 1127   BILITOT 0.7 07/29/2018 1127        Assessment & Plan:  Patient is a very pleasant 33 year old white female with significant anxiety disorder, history of panic attacks presenting for an evaluation for intestinal parasites.  Abdominal bloating- from a clinical perspective, the patient's symptoms are most consistent with an irritable bowel syndrome.  I am unsure however if this is organic in nature or due to the patient's significant anxiety disorder and.  I did spend some time discussing the burdens of new motherhood, the COVID pandemic, and her current employment and work situation with her.  She appeared to have a new perspective is to the fact that she may be having somatic symptoms related  to manifestations of her anxiety over all of these issues.  I am unsure if she has true delusional parasitosis as she does seem to question whether the integrative physician was correct in their diagnosis and proposed treatment plan.  From infectious standpoint, her risk would be extremely low if she has not had proper travel history nor has she had another exposures that would explain what how she would have acquired tapeworms.  Her most recent stool O&P was negative, which I would trust and not advise addressing further more attempting to treat empirically.  Patient would likely benefit from a counseling referral and/or psychiatry visit to evaluate her medications as appropriate for her severe anxiety disorder and.  From a GI standpoint, I will defer to my GI colleagues as to proper management of the patient's IBS.  She has no need for further infectious disease follow-up at this time.

## 2019-04-14 NOTE — Patient Instructions (Signed)
Stop probiotic for at least one month. Reduce coffee intake to one 4 oz. Cup of decaf coffee per day.

## 2019-04-16 ENCOUNTER — Ambulatory Visit: Payer: BLUE CROSS/BLUE SHIELD | Admitting: Infectious Diseases

## 2019-04-20 ENCOUNTER — Ambulatory Visit: Payer: Self-pay | Admitting: Infectious Diseases

## 2019-04-23 ENCOUNTER — Telehealth: Payer: Self-pay | Admitting: *Deleted

## 2019-04-23 NOTE — Telephone Encounter (Signed)
It should be able to be refrigerated until she can bring it in on Monday.

## 2019-04-23 NOTE — Telephone Encounter (Signed)
Patient called, in parking lot with stool sample to drop off, but our lab is closed. RN supplied patient with new collection kit, but patient needs to know how to store the sample until she can bring it to [place] (she lives in [place]).  Will connect with lab for further instructions. Landis Gandy, RN

## 2019-04-26 NOTE — Telephone Encounter (Signed)
Spoke with Clydie Braun in lab.  Sample should remain room temperature and be turned into lab within 24 hours of collection.  Relayed to patient. She will bring it in tomorrow.  Added her to the lab schedule.

## 2019-04-27 ENCOUNTER — Other Ambulatory Visit: Payer: BLUE CROSS/BLUE SHIELD

## 2019-04-29 ENCOUNTER — Other Ambulatory Visit: Payer: Self-pay

## 2019-04-29 ENCOUNTER — Other Ambulatory Visit: Payer: BLUE CROSS/BLUE SHIELD

## 2019-04-29 DIAGNOSIS — R140 Abdominal distension (gaseous): Secondary | ICD-10-CM

## 2019-04-29 DIAGNOSIS — Z8619 Personal history of other infectious and parasitic diseases: Secondary | ICD-10-CM

## 2019-04-30 LAB — OVA AND PARASITE EXAMINATION
CONCENTRATE RESULT:: NONE SEEN
CONCENTRATE RESULT:: NONE SEEN
MICRO NUMBER:: 496516
MICRO NUMBER:: 496524
SPECIMEN QUALITY:: ADEQUATE
SPECIMEN QUALITY:: ADEQUATE
TRICHROME RESULT:: NONE SEEN
TRICHROME RESULT:: NONE SEEN

## 2019-07-26 ENCOUNTER — Other Ambulatory Visit: Payer: Self-pay

## 2019-07-27 ENCOUNTER — Encounter: Payer: Self-pay | Admitting: Obstetrics & Gynecology

## 2019-07-27 ENCOUNTER — Ambulatory Visit (INDEPENDENT_AMBULATORY_CARE_PROVIDER_SITE_OTHER): Payer: BC Managed Care – PPO | Admitting: Obstetrics & Gynecology

## 2019-07-27 VITALS — BP 110/80 | Ht 61.0 in | Wt 145.0 lb

## 2019-07-27 DIAGNOSIS — Z01419 Encounter for gynecological examination (general) (routine) without abnormal findings: Secondary | ICD-10-CM

## 2019-07-27 DIAGNOSIS — Z3169 Encounter for other general counseling and advice on procreation: Secondary | ICD-10-CM | POA: Diagnosis not present

## 2019-07-27 NOTE — Progress Notes (Signed)
Julia Hansen 02/02/1986 [id number]   History:    33 y.o. G1P1L1  Married.  Row is 33 yo.  RP:  Established patient presenting for annual gyn exam   HPI: Attempting conception for the first month now, stopped Nuvaring 07/02/2019.  No more bloating x stopping dairy products.  No pelvic pain.  No pain with intercourse.  Urine and bowel movements normal.  Breasts normal.  Body mass index 27.48.  Tennis instructor, good fitness.   Past medical history,surgical history, family history and social history were all reviewed and documented in the EPIC chart.  Gynecologic History Patient's last menstrual period was 06/30/2019. Contraception: none (Stopped Nuvaring July 24th/2020) Last Pap: 07/2018. Results were: Negative Last mammogram: Never Bone Density: Never Colonoscopy: Never  Obstetric History OB History  Gravida Para Term Preterm AB Living  1 1 1    0 1  SAB TAB Ectopic Multiple Live Births      0 0 1    # Outcome Date GA Lbr Len/2nd Weight Sex Delivery Anes PTL Lv  1 Term 06/23/16 2051w1d 33:06 / 02:06 7 lb 15 oz (3.6 kg) M Vag-Spont EPI  LIV     ROS: A ROS was performed and pertinent positives and negatives are included in the history.  GENERAL: No fevers or chills. HEENT: No change in vision, no earache, sore throat or sinus congestion. NECK: No pain or stiffness. CARDIOVASCULAR: No chest pain or pressure. No palpitations. PULMONARY: No shortness of breath, cough or wheeze. GASTROINTESTINAL: No abdominal pain, nausea, vomiting or diarrhea, melena or bright red blood per rectum. GENITOURINARY: No urinary frequency, urgency, hesitancy or dysuria. MUSCULOSKELETAL: No joint or muscle pain, no back pain, no recent trauma. DERMATOLOGIC: No rash, no itching, no lesions. ENDOCRINE: No polyuria, polydipsia, no heat or cold intolerance. No recent change in weight. HEMATOLOGICAL: No anemia or easy bruising or bleeding. NEUROLOGIC: No headache, seizures, numbness, tingling or weakness.  PSYCHIATRIC: No depression, no loss of interest in normal activity or change in sleep pattern.     Exam:   BP 110/80 (BP Location: Right Arm, Patient Position: Sitting, Cuff Size: Normal)    Ht 5\' 1"  (1.549 m)    Wt 145 lb (65.8 kg)    LMP 06/30/2019    BMI 27.40 kg/m   Body mass index is 27.4 kg/m.  General appearance : Well developed well nourished female. No acute distress HEENT: Eyes: no retinal hemorrhage or exudates,  Neck supple, trachea midline, no carotid bruits, no thyroidmegaly Lungs: Clear to auscultation, no rhonchi or wheezes, or rib retractions  Heart: Regular rate and rhythm, no murmurs or gallops Breast:Examined in sitting and supine position were symmetrical in appearance, no palpable masses or tenderness,  no skin retraction, no nipple inversion, no nipple discharge, no skin discoloration, no axillary or supraclavicular lymphadenopathy Abdomen: no palpable masses or tenderness, no rebound or guarding Extremities: no edema or skin discoloration or tenderness  Pelvic: Vulva: Normal             Vagina: No gross lesions or discharge  Cervix: No gross lesions or discharge.  Pap reflex done.  Uterus  AV, normal size, shape and consistency, non-tender and mobile  Adnexa  Without masses or tenderness  Anus: Normal   Assessment/Plan:  33 y.o. female for annual exam   1. Encounter for gynecological examination with Papanicolaou smear of cervix Normal gynecologic exam.  Pap reflex done.  Breast exam normal.  Body mass index at 27.4.  Continue with fitness.  High muscle mass.  Continue with healthy nutrition.  2. Encounter for preconception consultation Continue same level of fitness and healthy nutrition.  Prenatal vitamins daily.  Can come here for pregnancy confirmation and then will be followed at [facility].  Princess Bruins MD, 12:11 PM 07/27/2019

## 2019-07-28 LAB — PAP IG W/ RFLX HPV ASCU

## 2019-08-02 NOTE — Patient Instructions (Signed)
1. Encounter for gynecological examination with Papanicolaou smear of cervix Normal gynecologic exam.  Pap reflex done.  Breast exam normal.  Body mass index at 27.4.  Continue with fitness.  High muscle mass.  Continue with healthy nutrition.  2. Encounter for preconception consultation Continue same level of fitness and healthy nutrition.  Prenatal vitamins daily.  Can come here for pregnancy confirmation and then will be followed at [facility].  Shirleen, it was a pleasure seeing you today!  I will inform you of your results as soon as they are available.

## 2019-08-20 ENCOUNTER — Other Ambulatory Visit: Payer: Self-pay

## 2019-08-23 ENCOUNTER — Ambulatory Visit: Payer: BC Managed Care – PPO | Admitting: Obstetrics & Gynecology

## 2019-08-27 ENCOUNTER — Other Ambulatory Visit: Payer: Self-pay

## 2019-08-27 ENCOUNTER — Encounter: Payer: Self-pay | Admitting: Obstetrics & Gynecology

## 2019-08-27 ENCOUNTER — Ambulatory Visit: Payer: BC Managed Care – PPO | Admitting: Obstetrics & Gynecology

## 2019-08-27 VITALS — BP 120/78

## 2019-08-27 DIAGNOSIS — Z3201 Encounter for pregnancy test, result positive: Secondary | ICD-10-CM | POA: Diagnosis not present

## 2019-08-27 DIAGNOSIS — Z3491 Encounter for supervision of normal pregnancy, unspecified, first trimester: Secondary | ICD-10-CM

## 2019-08-27 DIAGNOSIS — N912 Amenorrhea, unspecified: Secondary | ICD-10-CM

## 2019-08-27 LAB — PREGNANCY, URINE: Preg Test, Ur: POSITIVE — AB

## 2019-08-27 NOTE — Patient Instructions (Signed)
1. Amenorrhea First trimester pregnancy.  UPT positive. - Pregnancy, urine  2. First trimester pregnancy [redacted] wk gestation per LMP.  On PNVs. Vit B6 recommended for Nausea.  Will use a topical corticosteroid as needed for a persistent pruritic rash of unknown etiology.  Will f/u in 2 weeks for Dating/Viability [place] here.  Will establish Ob care with Dr Mearl Latin. Pregnancy precautions reviewed. - US OB Transvaginal; Future  Kariyah, it was a pleasure seeing you today!

## 2019-08-27 NOTE — Progress Notes (Signed)
    Julia Hansen 35-May-1987 [id number]        33 y.o.  G1P1001 Married.  LMP 07/30/2019 at [redacted] weeks gestation.  EDD 05/04/2020.  RP: HPT positive for confirmation  HPI:  LMP normal 07/30/2019.  UPT pos on 9/11.  Starting to have nausea, no vomiting.  No pelvic pain.  No vaginal bleeding.  Has a rash with itching x many weeks.  Bx taken by Dermato, etiology unknown.  Controlling itching with topical CS.  Took Keflex for a skin infection x 2 weeks, finishing today.   OB History  Gravida Para Term Preterm AB Living  1 1 1    0 1  SAB TAB Ectopic Multiple Live Births      0 0 1    # Outcome Date GA Lbr Len/2nd Weight Sex Delivery Anes PTL Lv  1 Term 06/23/16 [redacted]w[redacted]d 33:06 / 02:06 7 lb 15 oz (3.6 kg) M Vag-Spont EPI  LIV    Past medical history,surgical history, problem list, medications, allergies, family history and social history were all reviewed and documented in the EPIC chart.   Directed ROS with pertinent positives and negatives documented in the history of present illness/assessment and plan.  Exam:  Vitals:   08/27/19 1205  BP: 120/78   General appearance:  Normal  Abdomen: Normal  Gynecologic exam: Vulva normal.  Bimanual exam:  Uterus AV, normal volume, mobile, NT.  No adnexal mass, NT.  Cervix long/firm/closed.  No blood.  Normal secretions.  UPT positive   Assessment/Plan:  33 y.o. G1P1001   1. Amenorrhea First trimester pregnancy.  UPT positive. - Pregnancy, urine  2. First trimester pregnancy [redacted] wk gestation per LMP.  On PNVs. Vit B6 recommended for Nausea.  Will use a topical corticosteroid as needed for a persistent pruritic rash of unknown etiology.  Will f/u in 2 weeks for Dating/Viability [place] here.  Will establish Ob care with Dr Mearl Latin. Pregnancy precautions reviewed. - US OB Transvaginal; Future  Counseling on above issues and coordination of care more than 50% for 15 minutes.  Princess Bruins MD, 12:10 PM 08/27/2019

## 2019-08-31 ENCOUNTER — Telehealth: Payer: Self-pay | Admitting: *Deleted

## 2019-08-31 NOTE — Telephone Encounter (Signed)
Pt understands and is aware of recomendations

## 2019-08-31 NOTE — Telephone Encounter (Signed)
Pt complains of rash which was mentioned at last visit see below :  Will use a topical corticosteroid  needed for a persistent pruritic rash of unknown etiology.  Pt states topical  corticosteroid is not helping. Pt states her dermatologist prescribed a cortisone shot. Pt wants to know if that is ok. I asked pt was dermatologist aware she's pregnant  she stated yes.  Will route to her Provider for advice.

## 2019-08-31 NOTE — Telephone Encounter (Signed)
Patient established Ob care already.  Given that her Ob will be responsible for her pregnancy, please call Ob practice to receive counseling.

## 2019-09-12 ENCOUNTER — Encounter: Payer: Self-pay | Admitting: Infectious Diseases

## 2019-09-23 LAB — OB RESULTS CONSOLE RPR: RPR: NONREACTIVE

## 2019-09-23 LAB — OB RESULTS CONSOLE GC/CHLAMYDIA
Chlamydia: NEGATIVE
Gonorrhea: NEGATIVE

## 2019-09-23 LAB — OB RESULTS CONSOLE RUBELLA ANTIBODY, IGM: Rubella: IMMUNE

## 2019-09-23 LAB — OB RESULTS CONSOLE ANTIBODY SCREEN: Antibody Screen: NEGATIVE

## 2019-09-23 LAB — OB RESULTS CONSOLE HIV ANTIBODY (ROUTINE TESTING): HIV: NONREACTIVE

## 2019-09-23 LAB — OB RESULTS CONSOLE GBS: GBS: POSITIVE

## 2019-09-23 LAB — OB RESULTS CONSOLE ABO/RH: RH Type: NEGATIVE

## 2019-09-23 LAB — OB RESULTS CONSOLE HEPATITIS B SURFACE ANTIGEN: Hepatitis B Surface Ag: NEGATIVE

## 2019-10-04 ENCOUNTER — Other Ambulatory Visit: Payer: Self-pay | Admitting: Obstetrics & Gynecology

## 2019-12-10 NOTE — L&D Delivery Note (Addendum)
Delivery Note At 11:20 PM a viable female was delivered via Vaginal, Spontaneous (Presentation: Right Occiput Anterior).  Moderate meconium stained fluid.  APGAR: 5, 7; weight pending.   Placenta status: manual extraction for cord avulsion; removed in pieces.  Cord: 3 vessels with the following complications: None.  Cord pH: n/a  Anesthesia: Epidural Episiotomy: None Lacerations: None;Vaginal Suture Repair: 3.0 vicryl rapide Est. Blood Loss (mL):  300  Mom to postpartum.  Baby to Couplet care / Skin to Skin.  Mitchel Honour 04/06/2020, 11:49 PM

## 2020-02-19 ENCOUNTER — Other Ambulatory Visit: Payer: Self-pay

## 2020-02-19 ENCOUNTER — Observation Stay (HOSPITAL_COMMUNITY)
Admission: AD | Admit: 2020-02-19 | Discharge: 2020-02-20 | Disposition: A | Discharge status: Home / Self Care | Payer: BC Managed Care – PPO | Attending: Obstetrics and Gynecology | Admitting: Obstetrics and Gynecology

## 2020-02-19 ENCOUNTER — Inpatient Hospital Stay (HOSPITAL_BASED_OUTPATIENT_CLINIC_OR_DEPARTMENT_OTHER): Payer: BC Managed Care – PPO

## 2020-02-19 ENCOUNTER — Encounter (HOSPITAL_COMMUNITY): Payer: Self-pay

## 2020-02-19 DIAGNOSIS — O09523 Supervision of elderly multigravida, third trimester: Secondary | ICD-10-CM | POA: Diagnosis not present

## 2020-02-19 DIAGNOSIS — Z20822 Contact with and (suspected) exposure to covid-19: Secondary | ICD-10-CM | POA: Diagnosis not present

## 2020-02-19 DIAGNOSIS — N939 Abnormal uterine and vaginal bleeding, unspecified: Secondary | ICD-10-CM

## 2020-02-19 DIAGNOSIS — O99343 Other mental disorders complicating pregnancy, third trimester: Secondary | ICD-10-CM | POA: Insufficient documentation

## 2020-02-19 DIAGNOSIS — Z3A36 36 weeks gestation of pregnancy: Secondary | ICD-10-CM | POA: Diagnosis not present

## 2020-02-19 DIAGNOSIS — G4700 Insomnia, unspecified: Secondary | ICD-10-CM | POA: Insufficient documentation

## 2020-02-19 DIAGNOSIS — O26893 Other specified pregnancy related conditions, third trimester: Secondary | ICD-10-CM

## 2020-02-19 DIAGNOSIS — O26853 Spotting complicating pregnancy, third trimester: Secondary | ICD-10-CM

## 2020-02-19 DIAGNOSIS — F419 Anxiety disorder, unspecified: Secondary | ICD-10-CM | POA: Insufficient documentation

## 2020-02-19 DIAGNOSIS — O4693 Antepartum hemorrhage, unspecified, third trimester: Principal | ICD-10-CM | POA: Diagnosis present

## 2020-02-19 DIAGNOSIS — Z8759 Personal history of other complications of pregnancy, childbirth and the puerperium: Secondary | ICD-10-CM | POA: Diagnosis not present

## 2020-02-19 DIAGNOSIS — Z79899 Other long term (current) drug therapy: Secondary | ICD-10-CM | POA: Diagnosis not present

## 2020-02-19 DIAGNOSIS — Z888 Allergy status to other drugs, medicaments and biological substances status: Secondary | ICD-10-CM | POA: Insufficient documentation

## 2020-02-19 DIAGNOSIS — Z3A32 32 weeks gestation of pregnancy: Secondary | ICD-10-CM

## 2020-02-19 DIAGNOSIS — Z6791 Unspecified blood type, Rh negative: Secondary | ICD-10-CM

## 2020-02-19 LAB — URINALYSIS, ROUTINE W REFLEX MICROSCOPIC
Bacteria, UA: NONE SEEN
Bilirubin Urine: NEGATIVE
Glucose, UA: NEGATIVE mg/dL
Hgb urine dipstick: NEGATIVE
Ketones, ur: NEGATIVE mg/dL
Nitrite: NEGATIVE
Protein, ur: NEGATIVE mg/dL
Specific Gravity, Urine: 1.017 (ref 1.005–1.030)
pH: 6 (ref 5.0–8.0)

## 2020-02-19 LAB — KLEIHAUER-BETKE STAIN
# Vials RhIg: 1
Fetal Cells %: 0 %
Quantitation Fetal Hemoglobin: 0 mL

## 2020-02-19 LAB — CBC
HCT: 34.5 % — ABNORMAL LOW (ref 36.0–46.0)
Hemoglobin: 11.7 g/dL — ABNORMAL LOW (ref 12.0–15.0)
MCH: 31.1 pg (ref 26.0–34.0)
MCHC: 33.9 g/dL (ref 30.0–36.0)
MCV: 91.8 fL (ref 80.0–100.0)
Platelets: 136 10*3/uL — ABNORMAL LOW (ref 150–400)
RBC: 3.76 MIL/uL — ABNORMAL LOW (ref 3.87–5.11)
RDW: 13.9 % (ref 11.5–15.5)
WBC: 8.5 10*3/uL (ref 4.0–10.5)
nRBC: 0 % (ref 0.0–0.2)

## 2020-02-19 LAB — SARS CORONAVIRUS 2 (TAT 6-24 HRS): SARS Coronavirus 2: NEGATIVE

## 2020-02-19 MED ORDER — NIFEDIPINE 10 MG PO CAPS
10.0000 mg | ORAL_CAPSULE | Freq: Once | ORAL | Status: AC
  Administered 2020-02-19: 10 mg via ORAL
  Filled 2020-02-19: qty 1

## 2020-02-19 MED ORDER — LACTATED RINGERS IV SOLN: INTRAVENOUS | Status: DC

## 2020-02-19 MED ORDER — SODIUM CHLORIDE 0.9% FLUSH: 3.0000 mL | Freq: Two times a day (BID) | INTRAVENOUS | Status: DC

## 2020-02-19 MED ORDER — PRENATAL MULTIVITAMIN CH
1.0000 | ORAL_TABLET | Freq: Every day | ORAL | Status: DC
  Administered 2020-02-20: 1 via ORAL
  Filled 2020-02-19: qty 1

## 2020-02-19 MED ORDER — SODIUM CHLORIDE 0.9% FLUSH: 3.0000 mL | INTRAVENOUS | Status: DC | PRN

## 2020-02-19 MED ORDER — LACTATED RINGERS IV BOLUS
500.0000 mL | Freq: Once | INTRAVENOUS | Status: AC
  Administered 2020-02-19: 500 mL via INTRAVENOUS

## 2020-02-19 MED ORDER — DOCUSATE SODIUM 100 MG PO CAPS
100.0000 mg | ORAL_CAPSULE | Freq: Every day | ORAL | Status: DC
  Administered 2020-02-20: 100 mg via ORAL
  Filled 2020-02-19: qty 1

## 2020-02-19 MED ORDER — CALCIUM CARBONATE ANTACID 500 MG PO CHEW: 2.0000 | CHEWABLE_TABLET | ORAL | Status: DC | PRN

## 2020-02-19 MED ORDER — ZOLPIDEM TARTRATE 5 MG PO TABS: 5.0000 mg | ORAL_TABLET | Freq: Every evening | ORAL | Status: DC | PRN

## 2020-02-19 MED ORDER — BETAMETHASONE SOD PHOS & ACET 6 (3-3) MG/ML IJ SUSP
12.0000 mg | INTRAMUSCULAR | Status: AC
  Administered 2020-02-19 – 2020-02-20 (×2): 12 mg via INTRAMUSCULAR
  Filled 2020-02-19: qty 5

## 2020-02-19 MED ORDER — ACETAMINOPHEN 325 MG PO TABS
650.0000 mg | ORAL_TABLET | ORAL | Status: DC | PRN
  Administered 2020-02-19: 650 mg via ORAL
  Filled 2020-02-19: qty 2

## 2020-02-19 MED ORDER — SODIUM CHLORIDE 0.9 % IV SOLN: 250.0000 mL | INTRAVENOUS | Status: DC | PRN

## 2020-02-19 NOTE — MAU Note (Signed)
Pt called, not in lobby 

## 2020-02-19 NOTE — MAU Note (Signed)
Julia Hansen is a 34 y.o. at [redacted]w[redacted]d here in MAU reporting: noticed some bleeding yesterday when she was wiping. Got a little heavier last night. Today it is lighter but is still seeing the bleeding. No pain, no LOF. +FM  Onset of complaint: yesterday  Pain score: 0/10  Vitals:   02/19/20 1320  BP: 106/69  Pulse: (!) 109  Resp: 16  Temp: 98.6 F (37 C)  SpO2: 100%     FHT: +FM  Lab orders placed from triage: UA

## 2020-02-19 NOTE — MAU Provider Note (Signed)
Chief Complaint:  Vaginal Bleeding   First Provider Initiated Contact with Patient 02/19/20 1401     HPI: Julia Hansen is a 34 y.o. G2P1001 at [redacted]w[redacted]d who presents to maternity admissions reporting vaginal bleeding. Started last night. First noticed specks of bright red blood in the toilet. Over night when she used the bathroom the water in the toilet bowl was red & there was cloudy blood in the toilet. Not having to wear a pad or passing clots. Can't tell if bleeding is vaginal or urinary. Was treated for a yeast infection last week but when she was retested it was negative. Denies contractions, dysuria, fever, flank pain. States her son accidentally kicked her in the abdomen last night but it did not cause pain & the bleeding started before that incident.  Good fetal movement.   Pregnancy Course: [facility], RH negative - received rhogam at her 28 wk visit. Reports normal anatomy ultrasound with normal placentation.  Hx of 36 wk PPROM with first pregnancy; was induced & delivered at 37 wks.   Past Medical History:  Diagnosis Date  . Anxiety   . History of fainting   . Insomnia   . Low back pain   . Panic attacks   . 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    OB History  Gravida Para Term Preterm AB Living  2 1 1    0 1  SAB TAB Ectopic Multiple Live Births      0 0 1    # Outcome Date GA Lbr Len/2nd Weight Sex Delivery Anes PTL Lv  2 Current           1 Term 06/23/16 [redacted]w[redacted]d 33:06 / 02:06 3600 g M Vag-Spont EPI  LIV   Past Surgical History:  Procedure Laterality Date  . MOLE REMOVAL    . ROTATOR CUFF REPAIR    . SHOULDER SURGERY Right   . TONSILLECTOMY    . WISDOM TOOTH EXTRACTION     Family History  Problem Relation Age of Onset  . Hypertension Mother   . ADD / ADHD Father   . COPD Paternal Grandmother   . Cancer Paternal Grandfather    Social History   Tobacco Use  . Smoking status: Never Smoker  . Smokeless tobacco: Never  Used  Substance Use Topics  . Alcohol use: No  . Drug use: No   Allergies  Allergen Reactions  . Trazodone And Nefazodone     Patient had immediate dizziness and shortness of breath and "fell on the floor".  Husband had to call EMS not sure if it is a panic attack vs. Possible medication reaction.    . Prednisone Anxiety and Other (See Comments)    Patient states it "makes her psycho."   Medications Prior to Admission  Medication Sig Dispense Refill Last Dose  . calcium carbonate (TUMS - DOSED IN MG ELEMENTAL CALCIUM) 500 MG chewable tablet Chew 1-2 tablets by mouth 2 (two) times daily as needed for indigestion or heartburn.     . cephALEXin (KEFLEX) 250 MG capsule Take by mouth 4 (four) times daily.     . Docosahexaenoic Acid (DHA OMEGA 3 PO) Take 1 capsule by mouth 2 (two) times daily.     . Multiple Vitamins-Minerals (MULTIVITAMIN WITH MINERALS) tablet Take 1 tablet by mouth daily.     . sertraline (ZOLOFT) 25 MG tablet Take 1-2 tablets (25-50 mg total) by mouth daily. Take 1 tab alternating  with 2 every other day for 14 days. After that period  take 50 mg daily. 60 tablet 5     I have reviewed patient's Past Medical Hx, Surgical Hx, Family Hx, Social Hx, medications and allergies.   ROS:  Review of Systems  Constitutional: Negative.  Negative for fever.  Gastrointestinal: Negative for abdominal pain.  Genitourinary: Positive for vaginal bleeding. Negative for dysuria.    Physical Exam   Patient Vitals for the past 24 hrs:  BP Temp Temp src Pulse Resp SpO2 Height Weight  02/19/20 1718 128/75 98 F (36.7 C) Oral -- 18 100 % -- --  02/19/20 1320 106/69 98.6 F (37 C) Oral (!) 109 16 100 % 5\' 2"  (1.575 m) --  02/19/20 1310 -- -- -- -- -- -- -- 75.1 kg    Constitutional: Well-developed, well-nourished female in no acute distress.  Cardiovascular: normal rate & rhythm, no murmur Respiratory: normal effort, lung sounds clear throughout GI: Abd soft, non-tender, gravid  appropriate for gestational age. Pos BS x 4 MS: Extremities nontender, no edema, normal ROM Neurologic: Alert and oriented x 4.  GU:      Pelvic: NEFG, no blood in vagina. Cervix friable. Small amount of physiologic discharge.   Dilation: Closed Exam by:: lawrence np  NST:  Baseline: 135 bpm, Variability: Good {> 6 bpm), Accelerations: Reactive and Decelerations: Absent   Labs: Results for orders placed or performed during the hospital encounter of 02/19/20 (from the past 24 hour(s))  Urinalysis, Routine w reflex microscopic     Status: Abnormal   Collection Time: 02/19/20  1:26 PM  Result Value Ref Range   Color, Urine YELLOW YELLOW   APPearance CLEAR CLEAR   Specific Gravity, Urine 1.017 1.005 - 1.030   pH 6.0 5.0 - 8.0   Glucose, UA NEGATIVE NEGATIVE mg/dL   Hgb urine dipstick NEGATIVE NEGATIVE   Bilirubin Urine NEGATIVE NEGATIVE   Ketones, ur NEGATIVE NEGATIVE mg/dL   Protein, ur NEGATIVE NEGATIVE mg/dL   Nitrite NEGATIVE NEGATIVE   Leukocytes,Ua TRACE (A) NEGATIVE   RBC / HPF 0-5 0 - 5 RBC/hpf   WBC, UA 0-5 0 - 5 WBC/hpf   Bacteria, UA NONE SEEN NONE SEEN   Squamous Epithelial / LPF 0-5 0 - 5   Mucus PRESENT   Kleihauer-Betke stain     Status: None   Collection Time: 02/19/20  4:44 PM  Result Value Ref Range   Fetal Cells % 0 %   Quantitation Fetal Hemoglobin 0 mL   # Vials RhIg 1   Type and screen [facility]     Status: None (Preliminary result)   Collection Time: 02/19/20  4:52 PM  Result Value Ref Range   ABO/RH(D) A NEG    Antibody Screen POS    Sample Expiration      02/22/2020,2359 Performed at [facility], [address], [place], [place]    Antibody Identification PENDING   CBC     Status: Abnormal   Collection Time: 02/19/20  6:21 PM  Result Value Ref Range   WBC 8.5 4.0 - 10.5 K/uL   RBC 3.76 (L) 3.87 - 5.11 MIL/uL   Hemoglobin 11.7 (L) 12.0 - 15.0 g/dL   HCT 02/21/20 (L) 58.8 - 50.2 %   MCV 91.8 80.0 - 100.0 fL   MCH  31.1 26.0 - 34.0 pg   MCHC 33.9 30.0 - 36.0 g/dL   RDW 77.4 12.8 - 78.6 %   Platelets  136 (L) 150 - 400 K/uL   nRBC 0.0 0.0 - 0.2 %    Imaging:  No results found.  MAU Course: Orders Placed This Encounter  Procedures  . SARS CORONAVIRUS 2 (TAT 6-24 HRS) Nasopharyngeal Nasopharyngeal Swab  . [place] MFM OB LIMITED  . Urinalysis, Routine w reflex microscopic  . Kleihauer-Betke stain  . CBC  . Diet regular Room service appropriate? Yes; Fluid consistency: Thin  . Notify physician (specify)  . Vital signs  . Defer vaginal exam for vaginal bleeding or PROM <37 weeks  . Initiate Oral Care Protocol  . Initiate Carrier Fluid Protocol  . SCDs  . Fetal monitoring  . Tocometry while awake  . Bed rest with bathroom privileges  . Full code  . Type and screen [facility]  . Place in observation (patient's expected length of stay will be less than 2 midnights)   Meds ordered this encounter  Medications  . acetaminophen (TYLENOL) tablet 650 mg  . zolpidem (AMBIEN) tablet 5 mg  . docusate sodium (COLACE) capsule 100 mg  . calcium carbonate (TUMS - dosed in mg elemental calcium) chewable tablet 400 mg of elemental calcium  . prenatal multivitamin tablet 1 tablet  . sodium chloride flush (NS) 0.9 % injection 3 mL  . sodium chloride flush (NS) 0.9 % injection 3 mL  . 0.9 %  sodium chloride infusion  . lactated ringers bolus 500 mL  . lactated ringers infusion  . NIFEdipine (PROCARDIA) capsule 10 mg  . betamethasone acetate-betamethasone sodium phosphate (CELESTONE) injection 12 mg    MDM: RH negative - pt reports she received rhogam at her 28 wk visit  No active bleeding on exam. Cervix friable; otherwise no signs of infection. Pt reports negative testing last week. Cervix closed/thick. Irregular ctx on monitor. Abdomen soft & non tender. Reactive fetal tracing  Limited ob ultrasound. Placenta appears normal.   U/a with trace leuks, otherwise negative. Patient with no  urinary symptoms. Will send urine for culture.   Reviewed patient with Drs. Pezzuto & Shaquelle Hernon. Will obs on obsc unit. CEFM, BMZ series, monitor if bleeding continues. Order KB stain.   Discussed plan with patient. She is agreeable with admission but declines BMZ. Reports allergy to steroids, including IM steroids she's received in the past. Discussed purpose/benefits of ANCS. Will not order at this time.   Discussed patient's admission with neonatologist (Dr. Algernon Huxley). Low risk for delivery at this time but baby may need to be transferred out if NICU full at time of delivery.   Assessment: 1. Vaginal bleeding in pregnancy, third trimester   2. [redacted] weeks gestation of pregnancy   3. Rh negative status during pregnancy in third trimester    Patient examined  No pain. Passing small clots in toilet as above. No blood in underwear. No pain with urination, no change in frequency.  PE-uterus soft, NT  FHT cat one UCs q4-6, mild  U/S in office 3.9.21 noted vtx, posterior placenta, EFW 4# 4oz (80%), AFI 57%  Plan: Obs on OBSC unit CEFM/TOCO KB stain pending  D/W patient above-no blood noted on speculum exam and cervix closed. However, last pregnancy had polyhydramnios and PPROM about 36 weeks with a 37 week delivery. Will start IV fluid bolus, nifedipine 10mg  po and betamethasone. She has a history of feeling anxious on prednisone. D/W potential benefit for baby if delivered in next week.   , MD 02/19/2020 6:50 PM

## 2020-02-19 NOTE — MAU Provider Note (Signed)
Chief Complaint:  Vaginal Bleeding   First Provider Initiated Contact with Patient 02/19/20 1401     HPI: Julia Hansen is a 34 y.o. G2P1001 at [redacted]w[redacted]d who presents to maternity admissions reporting vaginal bleeding. Started last night. First noticed specks of bright red blood in the toilet. Over night when she used the bathroom the water in the toilet bowl was red & there was cloudy blood in the toilet. Not having to wear a pad or passing clots. Can't tell if bleeding is vaginal or urinary. Was treated for a yeast infection last week but when she was retested it was negative. Denies contractions, dysuria, fever, flank pain. States her son accidentally kicked her in the abdomen last night but it did not cause pain & the bleeding started before that incident.  Good fetal movement.   Pregnancy Course: [facility], RH negative - received rhogam at her 28 wk visit. Reports normal anatomy ultrasound with normal placentation.  Hx of 36 wk PPROM with first pregnancy; was induced & delivered at 37 wks.   Past Medical History:  Diagnosis Date  . Anxiety   . History of fainting   . Insomnia   . Low back pain   . Panic attacks   . 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    OB History  Gravida Para Term Preterm AB Living  2 1 1    0 1  SAB TAB Ectopic Multiple Live Births      0 0 1    # Outcome Date GA Lbr Len/2nd Weight Sex Delivery Anes PTL Lv  2 Current           1 Term 06/23/16 [redacted]w[redacted]d 33:06 / 02:06 3600 g M Vag-Spont EPI  LIV   Past Surgical History:  Procedure Laterality Date  . MOLE REMOVAL    . ROTATOR CUFF REPAIR    . SHOULDER SURGERY Right   . TONSILLECTOMY    . WISDOM TOOTH EXTRACTION     Family History  Problem Relation Age of Onset  . Hypertension Mother   . COPD Paternal Grandmother   . Cancer Paternal Grandfather    Social History   Tobacco Use  . Smoking status: Never Smoker  . Smokeless tobacco: Never Used  Substance Use  Topics  . Alcohol use: No  . Drug use: No   Allergies  Allergen Reactions  . Trazodone And Nefazodone     Patient had immediate dizziness and shortness of breath and "fell on the floor".  Husband had to call EMS not sure if it is a panic attack vs. Possible medication reaction.    . Prednisone Anxiety and Other (See Comments)    Patient states it "makes her psycho."   Medications Prior to Admission  Medication Sig Dispense Refill Last Dose  . calcium carbonate (TUMS - DOSED IN MG ELEMENTAL CALCIUM) 500 MG chewable tablet Chew 1-2 tablets by mouth 2 (two) times daily as needed for indigestion or heartburn.     . cephALEXin (KEFLEX) 250 MG capsule Take by mouth 4 (four) times daily.     . Docosahexaenoic Acid (DHA OMEGA 3 PO) Take 1 capsule by mouth 2 (two) times daily.     . Multiple Vitamins-Minerals (MULTIVITAMIN WITH MINERALS) tablet Take 1 tablet by mouth daily.     . sertraline (ZOLOFT) 25 MG tablet Take 1-2 tablets (25-50 mg total) by mouth daily. Take 1 tab alternating with 2 every other day for 14  days. After that period  take 50 mg daily. 60 tablet 5     I have reviewed patient's Past Medical Hx, Surgical Hx, Family Hx, Social Hx, medications and allergies.   ROS:  Review of Systems  Constitutional: Negative.   Genitourinary: Positive for vaginal bleeding. Negative for dysuria.    Physical Exam   Patient Vitals for the past 24 hrs:  BP Temp Temp src Pulse Resp SpO2 Height Weight  02/19/20 1320 106/69 98.6 F (37 C) Oral (!) 109 16 100 % 5\' 2"  (1.575 m) --  02/19/20 1310 -- -- -- -- -- -- -- 75.1 kg    Constitutional: Well-developed, well-nourished female in no acute distress.  Cardiovascular: normal rate & rhythm, no murmur Respiratory: normal effort, lung sounds clear throughout GI: Abd soft, non-tender, gravid appropriate for gestational age. Pos BS x 4 MS: Extremities nontender, no edema, normal ROM Neurologic: Alert and oriented x 4.  GU:      Pelvic: NEFG, no  blood in vagina. Cervix friable. Small amount of physiologic discharge.   Dilation: Closed Exam by:: Kenora Spayd np  NST:  Baseline: 135 bpm, Variability: Good {> 6 bpm), Accelerations: Reactive and Decelerations: Absent   Labs: Results for orders placed or performed during the hospital encounter of 02/19/20 (from the past 24 hour(s))  Urinalysis, Routine w reflex microscopic     Status: Abnormal   Collection Time: 02/19/20  1:26 PM  Result Value Ref Range   Color, Urine YELLOW YELLOW   APPearance CLEAR CLEAR   Specific Gravity, Urine 1.017 1.005 - 1.030   pH 6.0 5.0 - 8.0   Glucose, UA NEGATIVE NEGATIVE mg/dL   Hgb urine dipstick NEGATIVE NEGATIVE   Bilirubin Urine NEGATIVE NEGATIVE   Ketones, ur NEGATIVE NEGATIVE mg/dL   Protein, ur NEGATIVE NEGATIVE mg/dL   Nitrite NEGATIVE NEGATIVE   Leukocytes,Ua TRACE (A) NEGATIVE   RBC / HPF 0-5 0 - 5 RBC/hpf   WBC, UA 0-5 0 - 5 WBC/hpf   Bacteria, UA NONE SEEN NONE SEEN   Squamous Epithelial / LPF 0-5 0 - 5   Mucus PRESENT     Imaging:  No results found.  MAU Course: Orders Placed This Encounter  Procedures  . 02/21/20 MFM OB LIMITED  . Urinalysis, Routine w reflex microscopic   No orders of the defined types were placed in this encounter.   MDM: RH negative - pt reports she received rhogam at her 28 wk visit  No active bleeding on exam. Cervix friable; otherwise no signs of infection. Pt reports negative testing last week. Cervix closed/thick. Irregular ctx on monitor. Abdomen soft & non tender. Reactive fetal tracing  Limited ob ultrasound. Placenta appears normal.   U/a with trace leuks, otherwise negative. Patient with no urinary symptoms. Will send urine for culture.   Reviewed patient with Drs. Pezzuto & Tomblin. Will obs on obsc unit. CEFM, BMZ series, monitor if bleeding continues. Order KB stain.   Discussed plan with patient. She is agreeable with admission but declines BMZ. Reports allergy to steroids, including IM  steroids she's received in the past. Discussed purpose/benefits of ANCS. Will not order at this time.   Discussed patient's admission with neonatologist (Dr. [place]). Low risk for delivery at this time but baby may need to be transferred out if NICU full at time of delivery.   Assessment: 1. Vaginal bleeding in pregnancy, third trimester   2. [redacted] weeks gestation of pregnancy   3. Rh negative status during pregnancy in third  trimester     Plan: Obs on OBSC unit CEFM/TOCO KB stain pending  Judeth Horn, NP 02/19/2020 4:41 PM

## 2020-02-20 ENCOUNTER — Observation Stay (HOSPITAL_BASED_OUTPATIENT_CLINIC_OR_DEPARTMENT_OTHER): Payer: BC Managed Care – PPO

## 2020-02-20 DIAGNOSIS — O99213 Obesity complicating pregnancy, third trimester: Secondary | ICD-10-CM

## 2020-02-20 DIAGNOSIS — O360130 Maternal care for anti-D [Rh] antibodies, third trimester, not applicable or unspecified: Secondary | ICD-10-CM

## 2020-02-20 DIAGNOSIS — Z3A32 32 weeks gestation of pregnancy: Secondary | ICD-10-CM

## 2020-02-20 DIAGNOSIS — E669 Obesity, unspecified: Secondary | ICD-10-CM

## 2020-02-20 DIAGNOSIS — O4693 Antepartum hemorrhage, unspecified, third trimester: Secondary | ICD-10-CM

## 2020-02-20 LAB — TYPE AND SCREEN
ABO/RH(D): A NEG
Antibody Screen: POSITIVE

## 2020-02-20 NOTE — Progress Notes (Signed)
No blood at all No pain  Today's Vitals   02/19/20 2355 02/19/20 2357 02/20/20 0425 02/20/20 0427  BP:  (!) 91/52  105/62  Pulse:  (!) 104  98  Resp:  18  18  Temp:  98.2 F (36.8 C)  98.2 F (36.8 C)  TempSrc:  Oral  Oral  SpO2: 98% 98% 97% 100%  Weight:      Height:      PainSc:       Body mass index is 30.27 kg/m.   Abdomen uterus soft, NT  FHT good accelerations, baseline sometimes 110s UCs none  A/P: vaginal bleeding. Blood was only noted while voiding prior to admission. It was not in her underwear and was not noted on vaginal exam.         Urine for C&S         BPP today         BMZ #2 later today         If BPP OK and she remains without bleeding

## 2020-02-20 NOTE — Progress Notes (Signed)
Discharge instructions given to pt. Discussed signs and symptoms to report to the MD, upcoming appointments, and meds. Pt verbalizes understanding and has no questions or concerns at this time. Pt denies any pain or bleeding and had a reactive NST. Pt discharged from hospital in stable condition.

## 2020-02-20 NOTE — Discharge Instructions (Signed)
Vaginal Bleeding During Pregnancy, Third Trimester  A small amount of bleeding (spotting) from the vagina is common during pregnancy. Sometimes the bleeding is normal and is not a problem, and sometimes it is a sign of something serious. Tell your doctor about any bleeding from your vagina right away. Follow these instructions at home: Activity  Follow your doctor's instructions about how active you can be. Your doctor may recommend that you: ? Stay in bed and only get up to use the bathroom. ? Continue light activity.  If needed, make plans for someone to help you with your normal activities.  Ask your doctor if it is safe for you to drive.  Do not lift anything that is heavier than 10 lb (4.5 kg) until your doctor says that this is safe.  Do not have sex or orgasms until your doctor says that this is safe. Medicines  Take over-the-counter and prescription medicines only as told by your doctor.  Do not take aspirin. It can cause bleeding. General instructions  Watch your condition for any changes.  Write down: ? The number of pads you use each day. ? How often you change pads. ? How soaked (saturated) your pads are.  Do not use tampons.  Do not douche.  If you pass any tissue from your vagina, save the tissue to show your doctor.  Keep all follow-up visits as told by your doctor. This is important. Contact a doctor if:  You have vaginal bleeding at any time during pregnancy.  You have cramps.  You have a fever. Get help right away if:  You have very bad cramps.  You have very bad pain in your back or belly (abdomen).  You have a gush of fluid from your vagina.  You pass large clots or a lot of tissue from your vagina.  Your bleeding gets worse.  You feel light-headed or weak.  You pass out (faint).  Your baby is moving less than usual, or not moving at all. Summary  Tell your doctor about any bleeding from your vagina right away.  Follow instructions  from your doctor about how active you can be. You may need someone to help you with your normal activities. This information is not intended to replace advice given to you by your health care provider. Make sure you discuss any questions you have with your health care provider. Document Revised: 03/16/2019 Document Reviewed: 02/26/2017 Elsevier Patient Education  2020 Elsevier Inc[employer] No pelvic entry Call office for appointment this week

## 2020-02-20 NOTE — Discharge Summary (Signed)
Physician Discharge Summary  Patient ID: Julia Hansen MRN: [id number] DOB/AGE: 01/22/1986 34 y.o.  Admit date: 02/19/2020 Discharge date: 02/20/2020  Admission Diagnoses:vaginal bleeding  Discharge Diagnoses:  Active Problems:   Vaginal bleeding in pregnancy, third trimester   Discharged Condition: good  Hospital Course: Patient presents to hospital with history of spots of blood only in toilet since previous evening. At first very small clots and then light red blood in toilet with urine. No blood on tissue or underwear and no pain or contraction. On examination in MAU no blood in vagina and cervix thick. She has a history of polyhydramnios and PPROM at about 36 weeks with pregnancy #1. Recent U/S in office noted normal growth and normal AFI. Ultrasound through MAU noted normal placenta. Admitted for observation. She had some mild contractions treated with IV fluids and nifedipine 10mg  po x 1. No bleeding at all in hospital. Urine sent for culture and is pending. BPP is 8/8. BMZ x 2 given. She is discharged home after BMZ #2 with instructions to stay in [place] (no travel for work), pelvic rest and no heavy lifting. Follow up in office this week.  Consults: None  Significant Diagnostic Studies: labs:  Results for orders placed or performed during the hospital encounter of 02/19/20 (from the past 24 hour(s))  Kleihauer-Betke stain     Status: None   Collection Time: 02/19/20  4:44 PM  Result Value Ref Range   Fetal Cells % 0 %   Quantitation Fetal Hemoglobin 0 mL   # Vials RhIg 1   SARS CORONAVIRUS 2 (TAT 6-24 HRS) Nasopharyngeal Nasopharyngeal Swab     Status: None   Collection Time: 02/19/20  4:44 PM   Specimen: Nasopharyngeal Swab  Result Value Ref Range   SARS Coronavirus 2 NEGATIVE NEGATIVE  Type and screen [place]     Status: None   Collection Time: 02/19/20  4:52 PM  Result Value Ref Range   ABO/RH(D) A NEG    Antibody Screen POS    Sample  Expiration 02/22/2020,2359    Antibody Identification      PASSIVELY ACQUIRED ANTI-D Performed at [facility], [address], [place], [place] [address]   CBC     Status: Abnormal   Collection Time: 02/19/20  6:21 PM  Result Value Ref Range   WBC 8.5 4.0 - 10.5 K/uL   RBC 3.76 (L) 3.87 - 5.11 MIL/uL   Hemoglobin 11.7 (L) 12.0 - 15.0 g/dL   HCT 34.5 (L) 36.0 - 46.0 %   MCV 91.8 80.0 - 100.0 fL   MCH 31.1 26.0 - 34.0 pg   MCHC 33.9 30.0 - 36.0 g/dL   RDW 13.9 11.5 - 15.5 %   Platelets 136 (L) 150 - 400 K/uL   nRBC 0.0 0.0 - 0.2 %    Treatments: IV hydration  Discharge Exam: Blood pressure (!) 98/59, pulse (!) 105, temperature 98.2 F (36.8 C), temperature source Oral, resp. rate 17, height 5\' 2"  (1.575 m), weight 75.1 kg, last menstrual period 07/30/2019, SpO2 100 %. General appearance: alert, cooperative and no distress GI: uterus soft and NT  Disposition: Discharge disposition: 01-Home or Self Care        Allergies as of 02/20/2020      Reactions   Trazodone And Nefazodone    Patient had immediate dizziness and shortness of breath and "fell on the floor".  Husband had to call EMS not sure if it is a panic attack vs.  Possible medication reaction.     Prednisone Anxiety, Other (See Comments)   Patient states it "makes her psycho."      Medication List    STOP taking these medications   cephALEXin 250 MG capsule Commonly known as: KEFLEX     TAKE these medications   calcium carbonate 500 MG chewable tablet Commonly known as: TUMS - dosed in mg elemental calcium Chew 1-2 tablets by mouth 2 (two) times daily as needed for indigestion or heartburn.   DHA OMEGA 3 PO Take 1 capsule by mouth 2 (two) times daily.   multivitamin with minerals tablet Take 1 tablet by mouth daily.   sertraline 25 MG tablet Commonly known as: ZOLOFT Take 1-2 tablets (25-50 mg total) by mouth daily. Take 1 tab alternating with 2 every other day for 14 days. After that period   take 50 mg daily.        Signed: Roselle Locus II 02/20/2020, 4:31 PM

## 2020-02-21 LAB — URINE CULTURE: Culture: >=100000 — AB

## 2020-03-18 ENCOUNTER — Inpatient Hospital Stay (HOSPITAL_COMMUNITY): Payer: BC Managed Care – PPO

## 2020-03-18 ENCOUNTER — Encounter (HOSPITAL_COMMUNITY): Payer: Self-pay | Admitting: Obstetrics & Gynecology

## 2020-03-18 ENCOUNTER — Other Ambulatory Visit: Payer: Self-pay

## 2020-03-18 ENCOUNTER — Inpatient Hospital Stay (HOSPITAL_COMMUNITY)
Admission: AD | Admit: 2020-03-18 | Discharge: 2020-03-19 | Disposition: A | Discharge status: Home / Self Care | Payer: BC Managed Care – PPO | Attending: Emergency Medicine | Admitting: Emergency Medicine

## 2020-03-18 DIAGNOSIS — Z79899 Other long term (current) drug therapy: Secondary | ICD-10-CM | POA: Diagnosis not present

## 2020-03-18 DIAGNOSIS — R000 Tachycardia, unspecified: Secondary | ICD-10-CM | POA: Diagnosis not present

## 2020-03-18 DIAGNOSIS — O2693 Pregnancy related conditions, unspecified, third trimester: Secondary | ICD-10-CM | POA: Diagnosis present

## 2020-03-18 DIAGNOSIS — R002 Palpitations: Secondary | ICD-10-CM | POA: Insufficient documentation

## 2020-03-18 DIAGNOSIS — Z3A36 36 weeks gestation of pregnancy: Secondary | ICD-10-CM | POA: Insufficient documentation

## 2020-03-18 DIAGNOSIS — Z3A00 Weeks of gestation of pregnancy not specified: Secondary | ICD-10-CM

## 2020-03-18 DIAGNOSIS — R0602 Shortness of breath: Secondary | ICD-10-CM | POA: Diagnosis not present

## 2020-03-18 LAB — BASIC METABOLIC PANEL
Anion gap: 9 (ref 5–15)
BUN: 6 mg/dL (ref 6–20)
CO2: 21 mmol/L — ABNORMAL LOW (ref 22–32)
Calcium: 8.7 mg/dL — ABNORMAL LOW (ref 8.9–10.3)
Chloride: 105 mmol/L (ref 98–111)
Creatinine, Ser: 0.58 mg/dL (ref 0.44–1.00)
GFR calc Af Amer: >60 mL/min (ref >60)
GFR calc non Af Amer: >60 mL/min (ref >60)
Glucose, Bld: 89 mg/dL (ref 70–99)
Potassium: 4.1 mmol/L (ref 3.5–5.1)
Sodium: 135 mmol/L (ref 135–145)

## 2020-03-18 LAB — CBC
HCT: 35.6 % — ABNORMAL LOW (ref 36.0–46.0)
Hemoglobin: 11.7 g/dL — ABNORMAL LOW (ref 12.0–15.0)
MCH: 30.6 pg (ref 26.0–34.0)
MCHC: 32.9 g/dL (ref 30.0–36.0)
MCV: 93.2 fL (ref 80.0–100.0)
Platelets: 133 10*3/uL — ABNORMAL LOW (ref 150–400)
RBC: 3.82 MIL/uL — ABNORMAL LOW (ref 3.87–5.11)
RDW: 13.8 % (ref 11.5–15.5)
WBC: 7.8 10*3/uL (ref 4.0–10.5)
nRBC: 0 % (ref 0.0–0.2)

## 2020-03-18 LAB — D-DIMER, QUANTITATIVE: D-Dimer, Quant: 0.66 ug/mL-FEU — ABNORMAL HIGH (ref 0.00–0.50)

## 2020-03-18 LAB — MAGNESIUM: Magnesium: 1.9 mg/dL (ref 1.7–2.4)

## 2020-03-18 LAB — TROPONIN I (HIGH SENSITIVITY): Troponin I (High Sensitivity): 3 ng/L (ref <18)

## 2020-03-18 MED ORDER — LACTATED RINGERS IV BOLUS
1000.0000 mL | Freq: Once | INTRAVENOUS | Status: AC
  Administered 2020-03-18: 1000 mL via INTRAVENOUS

## 2020-03-18 NOTE — ED Notes (Signed)
Pt to ct 

## 2020-03-18 NOTE — ED Notes (Signed)
Patient given applesauce and water. °

## 2020-03-18 NOTE — MAU Provider Note (Signed)
Chief Complaint: Shortness of Breath, Chest Pain, and Tachycardia   None     SUBJECTIVE HPI: Julia Hansen is a 34 y.o. G2P1001 who presents to maternity admissions for shortness of breath and elevated HR in the 130's. The patient attests to swimming today and symptoms starting immediatly after that. States she is fit and in shape and is concerned that swimming caused her to have these symptoms. No bleeding, no pregnancy complaints.   Past Medical History:  Diagnosis Date  . Anxiety   . History of fainting   . Insomnia   . Low back pain   . Panic attacks   . 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    Past Surgical History:  Procedure Laterality Date  . MOLE REMOVAL    . ROTATOR CUFF REPAIR    . SHOULDER SURGERY Right   . TONSILLECTOMY    . WISDOM TOOTH EXTRACTION     Social History   Socioeconomic History  . Marital status: Married    Spouse name: Not on file  . Number of children: 1  . Years of education: BA  . Highest education level: Not on file  Occupational History  . Occupation: Corporate treasurer  Tobacco Use  . Smoking status: Never Smoker  . Smokeless tobacco: Never Used  Substance and Sexual Activity  . Alcohol use: No  . Drug use: No  . Sexual activity: Yes    Partners: Male    Birth control/protection: None    Comment: 1st intercourse- 19, partners- 4, married- 3 yrs   Other Topics Concern  . Not on file  Social History Narrative   Denies using caffeine    Social Determinants of Health   Financial Resource Strain:   . Difficulty of Paying Living Expenses:   Food Insecurity:   . Worried About Programme researcher, broadcasting/film/video in the Last Year:   . Barista in the Last Year:   Transportation Needs:   . Freight forwarder (Medical):   Marland Kitchen Lack of Transportation (Non-Medical):   Physical Activity:   . Days of Exercise per Week:   . Minutes of Exercise per Session:   Stress:   . Feeling of Stress :   Social  Connections:   . Frequency of Communication with Friends and Family:   . Frequency of Social Gatherings with Friends and Family:   . Attends Religious Services:   . Active Member of Clubs or Organizations:   . Attends Banker Meetings:   Marland Kitchen Marital Status:   Intimate Partner Violence:   . Fear of Current or Ex-Partner:   . Emotionally Abused:   Marland Kitchen Physically Abused:   . Sexually Abused:    No current facility-administered medications on file prior to encounter.   Current Outpatient Medications on File Prior to Encounter  Medication Sig Dispense Refill  . calcium carbonate (TUMS - DOSED IN MG ELEMENTAL CALCIUM) 500 MG chewable tablet Chew 1-2 tablets by mouth 2 (two) times daily as needed for indigestion or heartburn.    . Docosahexaenoic Acid (DHA OMEGA 3 PO) Take 1 capsule by mouth 2 (two) times daily.    . Multiple Vitamins-Minerals (MULTIVITAMIN WITH MINERALS) tablet Take 1 tablet by mouth daily.    . sertraline (ZOLOFT) 25 MG tablet Take 1-2 tablets (25-50 mg total) by mouth daily. Take 1 tab alternating with 2 every other day for 14 days. After that period  take 50 mg daily.  60 tablet 5   Allergies  Allergen Reactions  . Trazodone And Nefazodone     Patient had immediate dizziness and shortness of breath and "fell on the floor".  Husband had to call EMS not sure if it is a panic attack vs. Possible medication reaction.    . Prednisone Anxiety and Other (See Comments)    Patient states it "makes her psycho."    ROS:  Review of Systems  Respiratory: Positive for chest tightness and shortness of breath.   Cardiovascular: Positive for chest pain.   I have reviewed patient's Past Medical Hx, Surgical Hx, Family Hx, Social Hx, medications and allergies.   Physical Exam   Patient Vitals for the past 24 hrs:  BP Temp Temp src Pulse Resp SpO2  03/18/20 1959 126/80 99 F (37.2 C) Oral (!) 104 16 99 %   Physical Exam  Constitutional: She is oriented to person, place,  and time. She appears well-developed and well-nourished.  Non-toxic appearance. She does not have a sickly appearance. She does not appear ill. No distress.  Musculoskeletal:        General: Normal range of motion.  Neurological: She is alert and oriented to person, place, and time.  Skin: She is not diaphoretic.   MDM  Patient with no pregnancy complaints. Here with new onset cardiac, and respiratory symptoms. Discussed patient with [facility] ED who agree's the ED is an appropriate place for further evaluation. Patient to be escorted by RN to Ed.   ASSESSMENT MSE Complete  PLAN  Transfer to ED via wheelchair.  + Fetal heart tones via doppler   Plumer Mittelstaedt, Artist Pais, NP 03/18/2020 8:24 PM

## 2020-03-18 NOTE — MAU Note (Signed)
Patient MSE by Venia Carbon NP. She called report to [employer]. [facility] charge nurse [place] notified.

## 2020-03-18 NOTE — Progress Notes (Signed)
[facility] RN called to assess patient presenting with acute onset SOB, tachycardia and L sided chest pain lasting for 30 minutes, since resolved.  Patient G2P1, [redacted]w[redacted]d.  Denies LOF, vaginal bleeding, cramping/contractions. Endorses fetal movement.  EFM applied and NST completed.  Baseline 135, moderate variability, accelerations present, no decelerations seen, no contractions noted.  Patient's OB physician called, and patient cleared by OB.  Signs and symptoms r/t preterm labor, etc, warranting further assessment discussed, patient verbalized understanding.

## 2020-03-18 NOTE — MAU Note (Signed)
Patient reports to MAU stating she went swimming today and after she was feeling SOB and having chest pain. Pt states she lying down after and the pain and SOB persisted. No bleeding or LOF. +FM. Pt reports she feels like she just got done running a race.

## 2020-03-18 NOTE — ED Provider Notes (Signed)
[facility] EMERGENCY DEPARTMENT Provider Note   CSN: [id number] Arrival date & time: 03/18/20  1946     History Chief Complaint  Patient presents with  . Shortness of Breath  . Chest Pain  . Tachycardia    Julia Hansen is a 34 y.o. female.  HPI   34 year old female G2P1 presenting to the ED complaining of acute onset SOB with associated palpitations/tachycardia and L sided tight CP for approximately 30 minutes that has since resolved. The patient had been spending some time floating in a friend's pool just prior. No associated nausea, vomiting. Reports she felt like "I just ran 5 miles." No gush of fluids or vaginal bleeding. She still feels the baby moving. She has not felt any contractions. She had baseline pelvic pressure that is unchanged. No unilateral LE swelling or pain, no hormone therapy, no CA history, no previous blood clots, no long trips or flights. No asthma or history of breathing problems otherwise.  Past Medical History:  Diagnosis Date  . Anxiety   . History of fainting   . Insomnia   . Low back pain   . Panic attacks   . 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     Patient Active Problem List   Diagnosis Date Noted  . Vaginal bleeding in pregnancy, third trimester 02/19/2020  . Nonallopathic lesion of cervical region 01/23/2018  . Nonallopathic lesion of thoracic region 01/23/2018  . Nonallopathic lesion of rib cage 01/23/2018  . Cervicogenic headache 01/16/2018  . 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 SVD (spontaneous vaginal delivery) (7/16) 06/24/2016  . Postpartum care following vaginal delivery (7/16) 06/24/2016  . Postpartum state 06/23/2016  . Preterm premature rupture of membranes 06/21/2016    Past Surgical History:  Procedure Laterality Date  . MOLE REMOVAL    . ROTATOR CUFF REPAIR    . SHOULDER SURGERY Right   . TONSILLECTOMY    . WISDOM TOOTH EXTRACTION       OB  History    Gravida  2   Para  1   Term  1   Preterm      AB  0   Living  1     SAB      TAB      Ectopic  0   Multiple  0   Live Births  1           Family History  Problem Relation Age of Onset  . Hypertension Mother   . ADD / ADHD Father   . COPD Paternal Grandmother   . Cancer Paternal Grandfather     Social History   Tobacco Use  . Smoking status: Never Smoker  . Smokeless tobacco: Never Used  Substance Use Topics  . Alcohol use: No  . Drug use: No    Home Medications Prior to Admission medications   Medication Sig Start Date End Date Taking? Authorizing Provider  calcium carbonate (TUMS - DOSED IN MG ELEMENTAL CALCIUM) 500 MG chewable tablet Chew 1-2 tablets by mouth 2 (two) times daily as needed for indigestion or heartburn.    [provider]  Docosahexaenoic Acid (DHA OMEGA 3 PO) Take 1 capsule by mouth 2 (two) times daily.    [provider]  Multiple Vitamins-Minerals (MULTIVITAMIN WITH MINERALS) tablet Take 1 tablet by mouth daily.    [provider]  sertraline (ZOLOFT) 25 MG tablet Take 1-2 tablets (25-50 mg total)  by mouth daily. Take 1 tab alternating with 2 every other day for 14 days. After that period  take 50 mg daily. 08/14/17   Dohmeier, Asencion Partridge, MD    Allergies    Trazodone and nefazodone and Prednisone  Review of Systems   Review of Systems  Constitutional: Negative for activity change, appetite change, chills, diaphoresis, fatigue and fever.  HENT: Negative for congestion and rhinorrhea.   Respiratory: Positive for shortness of breath. Negative for cough and wheezing.   Cardiovascular: Positive for chest pain and palpitations.  Gastrointestinal: Negative for abdominal distention, abdominal pain, diarrhea, nausea and vomiting.  Genitourinary: Negative for decreased urine volume, difficulty urinating, dysuria, frequency and urgency.  Musculoskeletal: Negative for gait problem.  Skin: Negative for color  change and wound.  Neurological: Negative for dizziness, syncope, weakness, light-headedness and headaches.  All other systems reviewed and are negative.   Physical Exam Updated Vital Signs BP 113/71   Pulse 90   Temp 99 F (37.2 C) (Oral)   Resp 20   LMP 07/30/2019   SpO2 100%   Physical Exam Vitals and nursing note reviewed.  Constitutional:      General: She is not in acute distress.    Appearance: Normal appearance. She is normal weight. She is not ill-appearing.  HENT:     Head: Normocephalic.     Right Ear: External ear normal.     Left Ear: External ear normal.     Nose: Nose normal.     Mouth/Throat:     Mouth: Mucous membranes are moist.     Pharynx: Oropharynx is clear.  Eyes:     Extraocular Movements: Extraocular movements intact.  Cardiovascular:     Rate and Rhythm: Regular rhythm. Tachycardia present.     Pulses: Normal pulses.     Heart sounds: Normal heart sounds.  Pulmonary:     Effort: Pulmonary effort is normal. No tachypnea, accessory muscle usage or respiratory distress.     Breath sounds: Normal breath sounds. No wheezing or rhonchi.  Abdominal:     General: Bowel sounds are normal.     Palpations: Abdomen is soft.     Tenderness: There is no abdominal tenderness. There is no guarding.  Musculoskeletal:     Cervical back: Normal range of motion.     Right lower leg: No edema.     Left lower leg: No edema.  Skin:    General: Skin is warm and dry.     Capillary Refill: Capillary refill takes less than 2 seconds.  Neurological:     General: No focal deficit present.     Mental Status: She is alert and oriented to person, place, and time. Mental status is at baseline.  Psychiatric:        Mood and Affect: Mood normal.     ED Results / Procedures / Treatments   Labs (all labs ordered are listed, but only abnormal results are displayed) Labs Reviewed  CBC - Abnormal; Notable for the following components:      Result Value   RBC 3.82 (*)      Hemoglobin 11.7 (*)    HCT 35.6 (*)    Platelets 133 (*)    All other components within normal limits  BASIC METABOLIC PANEL - Abnormal; Notable for the following components:   CO2 21 (*)    Calcium 8.7 (*)    All other components within normal limits  D-DIMER, QUANTITATIVE (NOT AT [facility]) - Abnormal; Notable for the following components:  D-Dimer, Quant 0.66 (*)    All other components within normal limits  MAGNESIUM  TROPONIN I (HIGH SENSITIVITY)    EKG None  Radiology No results found.  Procedures Procedures (including critical care time)  Medications Ordered in ED Medications  lactated ringers bolus 1,000 mL (0 mLs Intravenous Stopped 03/18/20 2355)    ED Course  I have reviewed the triage vital signs and the nursing notes.  Pertinent labs & imaging results that were available during my care of the patient were reviewed by me and considered in my medical decision making (see chart for details).    MDM Rules/Calculators/A&P                      34 year old female G2P1 presenting to the ED complaining of acute onset SOB with associated palpitations/tachycardia and L sided tight CP for approximately 30 minutes that has since resolved.  Differential diagnoses considered include panic attack, hyperventilation, exertional pregnancy related chest pain, pulmonary embolism, less likely ACS, spontaneous coronary artery dissection, PTX, PNA, aortic dissection, pericarditis or myocarditis, low suspicion for any infectious source.  Will obtain a D-dimer and troponin as well as a chest x-ray for further evaluation of possible VTE  Patient cleared by OB.  Initial interventions 1 L IV LR  ECG interpreted by me demonstrated S tachy at 102 bpm , nml axis, nml intervals, no ST or T wave changes suggestive of acute ischemia, no previous for comparison, no additional signs of right heart strain  Patient was uncomfortable obtaining chest x-ray with concerns for radiation to her baby.   Explained the risks and benefits to the patient including her reasoning for obtaining chest x-ray.  Following shared decision-making conversation the patient and her husband were more comfortable holding off on obtaining a chest x-ray with the understanding that if the patient's troponin or D-dimer are abnormal we will proceed with obtaining a chest x-ray.  Labs demonstrated no significant leukocytosis, stable normocytic anemia with a hemoglobin 11.7 and MCV of 93.2, mild though stable thrombocytopenia to 133, magnesium 1.9, D-dimer elevated to 0.66, troponin negative  Discussed obtaining a CTA PE study with the patient and her husband. Explained the risks and benefits specifically in concern the the patient may have an acute PE which may be the explanation for her presentation. Discussed the radiation risk to the patient's fetus as well. Toxic dose of rads being approx 5 and the radiation exposure of a CTA PE study being approx. Marland Kitchen025 rads or 200x less radiation than the toxic dose to the fetus. Answered the patient and her husbands questions to the best of my ability. I feel that the patient is at high risk for PE and would recommend obtaining the PE study with concern that she may be at increased risk for life threatening PE related to hypercoagulability in the setting of third trimester pregnancy.  Following this shared decision-making conversation the patient and her husband agreed to obtaining the CTA PE study.  If the PE study is negative, I feel that the patient would be safe and stable for discharge. If positive we will need to discuss anticoagulation with LMWH in conjunction with OB/Gyn.  Pt care was handed off to [employer] PA-C at 0000.  Complete history and physical and current plan have been communicated.  Please refer to their note for the remainder of ED care and ultimate disposition.   The plan for this patient was discussed with Dr. Silverio Lay, who voiced agreement and  who oversaw  evaluation and treatment of this patient.  Final Clinical Impression(s) / ED Diagnoses Final diagnoses:  Shortness of breath  Tachycardia    Rx / DC Orders ED Discharge Orders    None       Gracy Bruins, MD 03/18/20 2359    Charlynne Pander, MD 03/23/20 1626

## 2020-03-19 ENCOUNTER — Inpatient Hospital Stay (HOSPITAL_COMMUNITY): Payer: BC Managed Care – PPO

## 2020-03-19 ENCOUNTER — Ambulatory Visit (HOSPITAL_COMMUNITY): Admission: RE | Admit: 2020-03-19 | Payer: BC Managed Care – PPO | Source: Ambulatory Visit

## 2020-03-19 MED ORDER — IOHEXOL 350 MG/ML SOLN
65.0000 mL | Freq: Once | INTRAVENOUS | Status: AC | PRN
  Administered 2020-03-19: 65 mL via INTRAVENOUS

## 2020-03-19 NOTE — ED Provider Notes (Signed)
Care assumed from Dr. Nada Boozer and Dr. Silverio Lay.  Please see her full H&P.  In short,  Julia Hansen is a 34 y.o. female G2P1 at 67W presents for shortness of breath, tachycardia, palpitations and chest pain.  Patient has been on bedrest for the last 3 weeks.  On arrival to the emergency department she is found to be tachycardic.  Elevated D-dimer during work-up.  Risk/benefit of CT angiogram discussed with the patient by previous providers and was ordered.  Physical Exam  BP 104/65   Pulse 90   Temp 99 F (37.2 C) (Oral)   Resp 15   LMP 07/30/2019   SpO2 97%   Physical Exam Vitals and nursing note reviewed.  Constitutional:      General: She is not in acute distress.    Appearance: She is well-developed.  HENT:     Head: Normocephalic.  Eyes:     General: No scleral icterus.    Conjunctiva/sclera: Conjunctivae normal.  Cardiovascular:     Rate and Rhythm: Normal rate.  Pulmonary:     Effort: Pulmonary effort is normal.  Musculoskeletal:        General: Normal range of motion.     Cervical back: Normal range of motion.     Right lower leg: Edema ( trace) present.     Left lower leg: Edema ( trace) present.  Skin:    General: Skin is warm and dry.  Neurological:     Mental Status: She is alert.     ED Course/Procedures   Clinical Course as of Mar 19 108  Sat Mar 18, 2020  2330 Plan: Pt pending CTA to r/o PE.  If neg she may be d/c home.   [HM]    Clinical Course User Index [HM] Loney Domingo, Boyd Kerbs    Procedures    CT Angio Chest PE W and/or Wo Contrast  Result Date: 03/19/2020 CLINICAL DATA:  Shortness of breath. EXAM: CT ANGIOGRAPHY CHEST WITH CONTRAST TECHNIQUE: Multidetector CT imaging of the chest was performed using the standard protocol during bolus administration of intravenous contrast. Multiplanar CT image reconstructions and MIPs were obtained to evaluate the vascular anatomy. CONTRAST:  13mL OMNIPAQUE IOHEXOL 350 MG/ML SOLN COMPARISON:  None.  FINDINGS: Cardiovascular: Satisfactory opacification of the pulmonary arteries to the segmental level. No evidence of pulmonary embolism. Normal heart size. No pericardial effusion. Mediastinum/Nodes: No enlarged mediastinal, hilar, or axillary lymph nodes. Thyroid gland, trachea, and esophagus demonstrate no significant findings. Lungs/Pleura: Lungs are clear. No pleural effusion or pneumothorax. Upper Abdomen: No acute abnormality. Musculoskeletal: No chest wall abnormality. No acute or significant osseous findings. Review of the MIP images confirms the above findings. IMPRESSION: No evidence of pulmonary embolism or acute cardiopulmonary disease. Electronically Signed   By: Aram Candela M.D.   On: 03/19/2020 00:40     MDM  CT angio without evidence of pulmonary embolism.  Patient not persistently tachycardic or short of breath.  No return of chest pain.  She is well-appearing.  Discussed findings of CT. We did discuss possible DVT given pregnancy, bed rest, trace edema and episode today.  Will order outpatient venous Dopplers. Discussed return precautions.  Patient and husband state understanding and are in agreement with the plan.  Shortness of breath  Tachycardia  Palpitations     Jayde Mcallister, Boyd Kerbs 03/19/20 0119    Glynn Octave, MD 03/19/20 [phone]

## 2020-03-19 NOTE — Discharge Instructions (Addendum)
IMPORTANT PATIENT INSTRUCTIONS:  You have been scheduled for an Outpatient Vascular Study at [facility].    If tomorrow is a Saturday or Sunday, please go to the [facility] Emergency Department Registration Desk at 8 am tomorrow morning and tell them you are there for a vascular study.  If tomorrow is a weekday (Monday-Friday), please go to [facility] Admitting Department at 8 am and tell them you are there for a vascular study.  1. Medications: usual home medications 2. Treatment: rest, drink plenty of fluids,  3. Follow Up: Please followup with your OB/GYN in 2 days for discussion of your diagnoses and further evaluation after today's visit; Please return to the ER for return of shortness of breath or other concerns

## 2020-03-19 NOTE — ED Notes (Signed)
Pt verbalized understanding of d/c instructions, follow up care, s/s requiring return to ed. Pt had no further questions a this time and was transported via wheelchair to POV.

## 2020-03-29 ENCOUNTER — Telehealth (HOSPITAL_COMMUNITY): Payer: Self-pay | Admitting: *Deleted

## 2020-03-29 ENCOUNTER — Encounter (HOSPITAL_COMMUNITY): Payer: Self-pay | Admitting: *Deleted

## 2020-03-29 NOTE — Telephone Encounter (Signed)
Preadmission screen  

## 2020-03-30 ENCOUNTER — Encounter (HOSPITAL_COMMUNITY): Payer: Self-pay | Admitting: *Deleted

## 2020-04-05 ENCOUNTER — Other Ambulatory Visit (HOSPITAL_COMMUNITY)
Admission: RE | Admit: 2020-04-05 | Discharge: 2020-04-05 | Disposition: A | Discharge status: Home / Self Care | Payer: BC Managed Care – PPO | Source: Ambulatory Visit | Attending: Obstetrics and Gynecology | Admitting: Obstetrics and Gynecology

## 2020-04-05 ENCOUNTER — Other Ambulatory Visit (HOSPITAL_COMMUNITY): Payer: BC Managed Care – PPO

## 2020-04-05 DIAGNOSIS — Z20822 Contact with and (suspected) exposure to covid-19: Secondary | ICD-10-CM | POA: Insufficient documentation

## 2020-04-05 DIAGNOSIS — Z01812 Encounter for preprocedural laboratory examination: Secondary | ICD-10-CM | POA: Insufficient documentation

## 2020-04-05 LAB — SARS CORONAVIRUS 2 (TAT 6-24 HRS): SARS Coronavirus 2: NEGATIVE

## 2020-04-06 ENCOUNTER — Other Ambulatory Visit: Payer: Self-pay

## 2020-04-06 ENCOUNTER — Inpatient Hospital Stay (HOSPITAL_COMMUNITY): Payer: BC Managed Care – PPO | Admitting: Anesthesiology

## 2020-04-06 ENCOUNTER — Inpatient Hospital Stay (HOSPITAL_COMMUNITY)
Admission: AD | Admit: 2020-04-06 | Discharge: 2020-04-08 | DRG: 798 | Disposition: A | Discharge status: Home / Self Care | Payer: BC Managed Care – PPO | Attending: Obstetrics & Gynecology | Admitting: Obstetrics & Gynecology

## 2020-04-06 ENCOUNTER — Encounter (HOSPITAL_COMMUNITY): Payer: Self-pay | Admitting: Obstetrics & Gynecology

## 2020-04-06 DIAGNOSIS — F419 Anxiety disorder, unspecified: Secondary | ICD-10-CM | POA: Diagnosis present

## 2020-04-06 DIAGNOSIS — O26893 Other specified pregnancy related conditions, third trimester: Secondary | ICD-10-CM | POA: Diagnosis present

## 2020-04-06 DIAGNOSIS — Z3493 Encounter for supervision of normal pregnancy, unspecified, third trimester: Secondary | ICD-10-CM

## 2020-04-06 DIAGNOSIS — Z3A39 39 weeks gestation of pregnancy: Secondary | ICD-10-CM

## 2020-04-06 DIAGNOSIS — O99344 Other mental disorders complicating childbirth: Secondary | ICD-10-CM | POA: Diagnosis present

## 2020-04-06 DIAGNOSIS — Z20822 Contact with and (suspected) exposure to covid-19: Secondary | ICD-10-CM | POA: Diagnosis present

## 2020-04-06 DIAGNOSIS — O99824 Streptococcus B carrier state complicating childbirth: Secondary | ICD-10-CM | POA: Diagnosis present

## 2020-04-06 DIAGNOSIS — Z3490 Encounter for supervision of normal pregnancy, unspecified, unspecified trimester: Secondary | ICD-10-CM

## 2020-04-06 LAB — COMPREHENSIVE METABOLIC PANEL
ALT: 24 U/L (ref 0–44)
AST: 27 U/L (ref 15–41)
Albumin: 2.6 g/dL — ABNORMAL LOW (ref 3.5–5.0)
Alkaline Phosphatase: 109 U/L (ref 38–126)
Anion gap: 11 (ref 5–15)
BUN: 8 mg/dL (ref 6–20)
CO2: 21 mmol/L — ABNORMAL LOW (ref 22–32)
Calcium: 9 mg/dL (ref 8.9–10.3)
Chloride: 104 mmol/L (ref 98–111)
Creatinine, Ser: 0.64 mg/dL (ref 0.44–1.00)
GFR calc Af Amer: >60 mL/min (ref >60)
GFR calc non Af Amer: >60 mL/min (ref >60)
Glucose, Bld: 100 mg/dL — ABNORMAL HIGH (ref 70–99)
Potassium: 4 mmol/L (ref 3.5–5.1)
Sodium: 136 mmol/L (ref 135–145)
Total Bilirubin: 0.5 mg/dL (ref 0.3–1.2)
Total Protein: 6.1 g/dL — ABNORMAL LOW (ref 6.5–8.1)

## 2020-04-06 LAB — CBC
HCT: 37.5 % (ref 36.0–46.0)
Hemoglobin: 12.4 g/dL (ref 12.0–15.0)
MCH: 30.8 pg (ref 26.0–34.0)
MCHC: 33.1 g/dL (ref 30.0–36.0)
MCV: 93.1 fL (ref 80.0–100.0)
Platelets: 119 10*3/uL — ABNORMAL LOW (ref 150–400)
RBC: 4.03 MIL/uL (ref 3.87–5.11)
RDW: 14.1 % (ref 11.5–15.5)
WBC: 10.4 10*3/uL (ref 4.0–10.5)
nRBC: 0 % (ref 0.0–0.2)

## 2020-04-06 LAB — TYPE AND SCREEN
ABO/RH(D): A NEG
Antibody Screen: NEGATIVE

## 2020-04-06 LAB — PROTEIN / CREATININE RATIO, URINE
Creatinine, Urine: 55.07 mg/dL
Protein Creatinine Ratio: 0.13 mg/mg{Cre} (ref 0.00–0.15)
Total Protein, Urine: 7 mg/dL

## 2020-04-06 MED ORDER — SODIUM CHLORIDE 0.9 % IV SOLN
5.0000 10*6.[IU] | Freq: Once | INTRAVENOUS | Status: DC
  Filled 2020-04-06: qty 5

## 2020-04-06 MED ORDER — FENTANYL-BUPIVACAINE-NACL 0.5-0.125-0.9 MG/250ML-% EP SOLN: 12.0000 mL/h | EPIDURAL | Status: DC | PRN

## 2020-04-06 MED ORDER — LACTATED RINGERS IV SOLN: INTRAVENOUS | Status: DC

## 2020-04-06 MED ORDER — EPHEDRINE 5 MG/ML INJ: 10.0000 mg | INTRAVENOUS | Status: DC | PRN

## 2020-04-06 MED ORDER — FLEET ENEMA 7-19 GM/118ML RE ENEM: 1.0000 | ENEMA | RECTAL | Status: DC | PRN

## 2020-04-06 MED ORDER — PENICILLIN G POT IN DEXTROSE 60000 UNIT/ML IV SOLN: 3.0000 10*6.[IU] | INTRAVENOUS | Status: DC

## 2020-04-06 MED ORDER — HYDROXYZINE HCL 50 MG PO TABS: 50.0000 mg | ORAL_TABLET | Freq: Four times a day (QID) | ORAL | Status: DC | PRN

## 2020-04-06 MED ORDER — ONDANSETRON HCL 4 MG/2ML IJ SOLN
4.0000 mg | Freq: Four times a day (QID) | INTRAMUSCULAR | Status: DC | PRN
  Administered 2020-04-06: 4 mg via INTRAVENOUS
  Filled 2020-04-06: qty 2

## 2020-04-06 MED ORDER — LACTATED RINGERS IV SOLN: 500.0000 mL | Freq: Once | INTRAVENOUS | Status: DC

## 2020-04-06 MED ORDER — OXYTOCIN 40 UNITS IN NORMAL SALINE INFUSION - SIMPLE MED
2.5000 [IU]/h | INTRAVENOUS | Status: DC
  Administered 2020-04-06: 2.5 [IU]/h via INTRAVENOUS
  Filled 2020-04-06: qty 1000

## 2020-04-06 MED ORDER — PHENYLEPHRINE 40 MCG/ML (10ML) SYRINGE FOR IV PUSH (FOR BLOOD PRESSURE SUPPORT): 80.0000 ug | PREFILLED_SYRINGE | INTRAVENOUS | Status: DC | PRN

## 2020-04-06 MED ORDER — LIDOCAINE HCL (PF) 1 % IJ SOLN: 30.0000 mL | INTRAMUSCULAR | Status: DC | PRN

## 2020-04-06 MED ORDER — LIDOCAINE HCL (PF) 1 % IJ SOLN
INTRAMUSCULAR | Status: DC | PRN
  Administered 2020-04-06: 5 mL via EPIDURAL

## 2020-04-06 MED ORDER — ACETAMINOPHEN 325 MG PO TABS: 650.0000 mg | ORAL_TABLET | ORAL | Status: DC | PRN

## 2020-04-06 MED ORDER — BUTORPHANOL TARTRATE 1 MG/ML IJ SOLN: 1.0000 mg | INTRAMUSCULAR | Status: DC | PRN

## 2020-04-06 MED ORDER — FENTANYL-BUPIVACAINE-NACL 0.5-0.125-0.9 MG/250ML-% EP SOLN
EPIDURAL | Status: AC
  Filled 2020-04-06: qty 250

## 2020-04-06 MED ORDER — SOD CITRATE-CITRIC ACID 500-334 MG/5ML PO SOLN: 30.0000 mL | ORAL | Status: DC | PRN

## 2020-04-06 MED ORDER — OXYCODONE-ACETAMINOPHEN 5-325 MG PO TABS: 1.0000 | ORAL_TABLET | ORAL | Status: DC | PRN

## 2020-04-06 MED ORDER — DIPHENHYDRAMINE HCL 50 MG/ML IJ SOLN: 12.5000 mg | INTRAMUSCULAR | Status: DC | PRN

## 2020-04-06 MED ORDER — LACTATED RINGERS IV SOLN: 500.0000 mL | INTRAVENOUS | Status: DC | PRN

## 2020-04-06 MED ORDER — PHENYLEPHRINE 40 MCG/ML (10ML) SYRINGE FOR IV PUSH (FOR BLOOD PRESSURE SUPPORT)
PREFILLED_SYRINGE | INTRAVENOUS | Status: AC
  Filled 2020-04-06: qty 10

## 2020-04-06 MED ORDER — OXYTOCIN 40 UNITS IN NORMAL SALINE INFUSION - SIMPLE MED: 1.0000 m[IU]/min | INTRAVENOUS | Status: DC

## 2020-04-06 MED ORDER — OXYCODONE-ACETAMINOPHEN 5-325 MG PO TABS: 2.0000 | ORAL_TABLET | ORAL | Status: DC | PRN

## 2020-04-06 MED ORDER — OXYTOCIN BOLUS FROM INFUSION
500.0000 mL | Freq: Once | INTRAVENOUS | Status: AC
  Administered 2020-04-06: 500 mL via INTRAVENOUS

## 2020-04-06 MED ORDER — TERBUTALINE SULFATE 1 MG/ML IJ SOLN
0.2500 mg | Freq: Once | INTRAMUSCULAR | Status: AC | PRN
  Administered 2020-04-06: 0.25 mg via SUBCUTANEOUS
  Filled 2020-04-06: qty 1

## 2020-04-06 MED ORDER — SODIUM CHLORIDE (PF) 0.9 % IJ SOLN
INTRAMUSCULAR | Status: DC | PRN
  Administered 2020-04-06: 12 mL/h via EPIDURAL

## 2020-04-06 NOTE — MAU Provider Note (Signed)
Patient Julia Hansen is a 34 y.o. G2P1001  at [redacted]w[redacted]d who presented to MAU for labor rule put; patient has IOL scheduled for tomorrow. She denies vaginal bleeding, but endorses bloody show and LOF. Denies decreased fetal movements. Upon arrival in MAU patient was found to have slightly elevated pressures but denied symptoms of pre-eclampsia. Dr. Langston Masker notified of patient's pressures, MAU provider and Dr. Langston Masker agreed to watch patient's pressures/recheck cervix and check labs.   While in MAU, patient's NST showed frequent painful contractions, with variables and patient complaining of intense pain. RN notified MD at 1800 and patient to be admitted. Provider at the bedside at 1808; IV started and patient repositioned. Patient to L and D with pre-e labs pending.   NST: 150 bpm, mod var, present variables, uterine contractions q 1 min.   Patient Vitals for the past 24 hrs:  BP Temp Temp src Pulse Resp SpO2 Height Weight  04/06/20 1851 -- -- -- -- -- 99 % -- --  04/06/20 1847 (!) 92/56 -- -- 91 16 -- -- --  04/06/20 1846 -- -- -- -- -- 98 % -- --  04/06/20 1841 -- -- -- -- -- 98 % -- --  04/06/20 1839 96/60 -- -- 99 16 -- -- --  04/06/20 1827 -- -- -- -- -- -- 5\' 2"  (1.575 m) 77.1 kg  04/06/20 1730 106/66 -- -- 93 -- 98 % -- --  04/06/20 1715 120/84 -- -- (!) 103 -- -- -- --  04/06/20 1713 (!) 133/93 98.3 F (36.8 C) Oral (!) 117 -- -- -- --   04/08/20

## 2020-04-06 NOTE — H&P (Signed)
Julia Hansen is a 34 y.o. female G2P0101 at 14 weeks presenting for labor.  CTX started early this morning and have continued to intensify.  No LOF or VB.  Active FM.  Last U/S on 4/13 EFW 6#8.  Antepartum course complicated by anxiety; on sertraline 25 mg.  Patient has h/o PPD.  GBS positive.  Rh negative.  Comfortable with CLEA.  OB History    Gravida  2   Para  1   Term  1   Preterm      AB  0   Living  1     SAB      TAB      Ectopic  0   Multiple  0   Live Births  1          Past Medical History:  Diagnosis Date  . Anxiety   . Blood in urine   . History of fainting   . Insomnia   . Low back pain   . Macular eruption   . Panic attacks   . Seizures ([place])    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    Past Surgical History:  Procedure Laterality Date  . COLONOSCOPY    . MOLE REMOVAL    . MOUTH SURGERY    . NASAL ENDOSCOPY    . ROTATOR CUFF REPAIR    . SHOULDER SURGERY Right   . TONSILLECTOMY    . WISDOM TOOTH EXTRACTION     Family History: family history includes ADD / ADHD in her father; COPD in her paternal grandmother; Cancer in her paternal grandfather; Hypertension in her mother. Social History:  reports that she has never smoked. She has never used smokeless tobacco. She reports that she does not drink alcohol or use drugs.     Maternal Diabetes: No Genetic Screening: Normal Maternal Ultrasounds/Referrals: Normal Fetal Ultrasounds or other Referrals:  None Maternal Substance Abuse:  No Significant Maternal Medications:  None Significant Maternal Lab Results:  Group B Strep negative and Rh negative Other Comments:  None  Review of Systems Maternal Medical History:  Reason for admission: Contractions.   Contractions: Onset was 13-24 hours ago.   Frequency: regular.   Perceived severity is moderate.    Fetal activity: Perceived fetal activity is normal.   Last perceived fetal movement was within the past hour.     Prenatal complications: no prenatal complications Prenatal Complications - Diabetes: none.    Dilation: 5 Effacement (%): 100 Station: -1 Exam by:: S Nix RN Blood pressure 104/70, pulse (!) 103, temperature 98.6 F (37 C), temperature source Oral, resp. rate 18, height 5\' 2"  (1.575 m), weight 77.1 kg, last menstrual period 07/30/2019, SpO2 100 %. Maternal Exam:  Uterine Assessment: Contraction strength is moderate.  Contraction frequency is regular.   Abdomen: Patient reports no abdominal tenderness. Fundal height is c/w dates.   Estimated fetal weight is 8#.       Fetal Exam Fetal Monitor Review: Baseline rate: 160.  Variability: moderate (6-25 bpm).   Pattern: accelerations present and no decelerations.    Fetal State Assessment: Category I - tracings are normal.     Physical Exam  Constitutional: She is oriented to person, place, and time. She appears well-developed and well-nourished.  Respiratory: Effort normal.  GI: Soft. There is no rebound and no guarding.  Musculoskeletal:        General: Normal range of motion.  Neurological: She is alert and oriented to person, place, and time.  Skin: Skin is warm and dry.  Psychiatric: She has a normal mood and affect. Her behavior is normal.    Prenatal labs: ABO, Rh: --/--/A NEG (04/29 1735) Antibody: NEG (04/29 1735) Rubella: Immune (10/15 0000) RPR: Nonreactive (10/15 0000)  HBsAg: Negative (10/15 0000)  HIV: Non-reactive (10/15 0000)  GBS: Positive/-- (10/15 0000)   Assessment/Plan: 33yo G2P0101 at 39 weeks with active labor -Augment prn -Anticipate NSVD  Mitchel Honour 04/06/2020, 7:53 PM

## 2020-04-06 NOTE — Anesthesia Preprocedure Evaluation (Signed)
Anesthesia Evaluation  Patient identified by MRN, date of birth, ID band Patient awake    Reviewed: Allergy & Precautions, NPO status , Patient's Chart, lab work & pertinent test results  Airway Mallampati: II  TM Distance: >3 FB Neck ROM: Full    Dental no notable dental hx. (+) Teeth Intact   Pulmonary neg pulmonary ROS,    Pulmonary exam normal breath sounds clear to auscultation       Cardiovascular Exercise Tolerance: Good Normal cardiovascular exam Rhythm:Regular Rate:Normal     Neuro/Psych  Headaches, Anxiety    GI/Hepatic negative GI ROS, Neg liver ROS,   Endo/Other  negative endocrine ROS  Renal/GU      Musculoskeletal   Abdominal   Peds  Hematology  (+) Blood dyscrasia, , Lab Results      Component                Value               Date                      WBC                      10.4                04/06/2020                HGB                      12.4                04/06/2020                HCT                      37.5                04/06/2020                MCV                      93.1                04/06/2020                PLT                      119 (L)             04/06/2020              Anesthesia Other Findings   Reproductive/Obstetrics (+) Pregnancy                             Anesthesia Physical Anesthesia Plan  ASA: II  Anesthesia Plan: Epidural   Post-op Pain Management:    Induction:   PONV Risk Score and Plan:   Airway Management Planned:   Additional Equipment:   Intra-op Plan:   Post-operative Plan:   Informed Consent: I have reviewed the patients History and Physical, chart, labs and discussed the procedure including the risks, benefits and alternatives for the proposed anesthesia with the patient or authorized representative who has indicated his/her understanding and acceptance.       Plan Discussed with:   Anesthesia Plan  Comments: (39 wk G2P1 w gestational  Thrombocytopenia for LEA)        Anesthesia Quick Evaluation

## 2020-04-06 NOTE — H&P (Signed)
Julia Hansen is a 34 y.o. female presenting for IOL. She has ahx anxiety c/w zoloft. She has a hx SVD after IOL s/s polyhydramnios at 36 wga c/b third degree laceration. She is AMA and had normal RR panorama.   OB History    Gravida  2   Para  1   Term  1   Preterm      AB  0   Living  1     SAB      TAB      Ectopic  0   Multiple  0   Live Births  1          Past Medical History:  Diagnosis Date  . Anxiety   . Blood in urine   . History of fainting   . Insomnia   . Low back pain   . Macular eruption   . Panic attacks   . 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    Past Surgical History:  Procedure Laterality Date  . COLONOSCOPY    . MOLE REMOVAL    . MOUTH SURGERY    . NASAL ENDOSCOPY    . ROTATOR CUFF REPAIR    . SHOULDER SURGERY Right   . TONSILLECTOMY    . WISDOM TOOTH EXTRACTION     Family History: family history includes ADD / ADHD in her father; COPD in her paternal grandmother; Cancer in her paternal grandfather; Hypertension in her mother. Social History:  reports that she has never smoked. She has never used smokeless tobacco. She reports that she does not drink alcohol or use drugs.     Maternal Diabetes: No Genetic Screening: Normal Maternal Ultrasounds/Referrals: Normal Fetal Ultrasounds or other Referrals:  None Maternal Substance Abuse:  No Significant Maternal Medications:  None Significant Maternal Lab Results:  Group B Strep negative Other Comments:  None  Review of Systems History   Last menstrual period 07/30/2019. Exam Physical Exam  (from office) NAD, A&O NWOB Abd soft, nondistended, gravid  Prenatal labs: ABO, Rh: --/--/A NEG (03/13 1652) Antibody: POS (03/13 1652) Rubella: Immune (10/15 0000) RPR: Nonreactive (10/15 0000)  HBsAg: Negative (10/15 0000)  HIV: Non-reactive (10/15 0000)  GBS: Positive/-- (10/15 0000) in urine and was TREATED. GBS swab was NEGATIVE at 36 week  visit.    Assessment/Plan: 34 yo G2P0101 @ 39.1 wga presenting for IOL s/s elective and hx third degree laceration. Cervix favorable. Plan for Pitocin/AROM. GBS negative at swab performed at 36 wga. However, will stil treat with PCN given positive in urine.     Julia Hansen 04/06/2020, 10:55 AM

## 2020-04-06 NOTE — Progress Notes (Signed)
Julia Hansen is a 34 y.o. G2P1001 at [redacted]w[redacted]d by ultrasound admitted for active labor  Subjective: Comfortable with CLEA  Objective: BP 107/60   Pulse (!) 103   Temp 97.9 F (36.6 C) (Axillary)   Resp 18   Ht 5\' 2"  (1.575 m)   Wt 77.1 kg   LMP 07/30/2019   SpO2 100%   BMI 31.09 kg/m  No intake/output data recorded. Total I/O In: -  Out: 25 [Urine:25]  FHT:  FHR: 150 bpm, variability: moderate,  accelerations:  Present,  decelerations:  Absent UC:   regular, every 2 minutes SVE:   Dilation: 8 Effacement (%): 100 Station: 0 Exam by:: Bryton Waight, MD  AROM with moderate meconium stained fluid  Labs: Lab Results  Component Value Date   WBC 10.4 04/06/2020   HGB 12.4 04/06/2020   HCT 37.5 04/06/2020   MCV 93.1 04/06/2020   PLT 119 (L) 04/06/2020    Assessment / Plan: Spontaneous labor, progressing normally  Labor: Progressing normally Preeclampsia:  n/a Fetal Wellbeing:  Category I Pain Control:  Epidural I/D:  n/a Anticipated MOD:  NSVD  04/08/2020 04/06/2020, 9:08 PM

## 2020-04-06 NOTE — MAU Note (Signed)
Pt presents to MAU with c/o ctx that started last night. She is reporting bloody show and mucous discharge. Pt denies LOF. +FM

## 2020-04-06 NOTE — Anesthesia Procedure Notes (Signed)
Epidural Patient location during procedure: OB Start time: 04/06/2020 6:31 PM End time: 04/06/2020 6:42 PM  Staffing Anesthesiologist: Trevor Iha, MD Performed: anesthesiologist   Preanesthetic Checklist Completed: patient identified, IV checked, site marked, risks and benefits discussed, surgical consent, monitors and equipment checked, pre-op evaluation and timeout performed  Epidural Patient position: sitting Prep: DuraPrep and site prepped and draped Patient monitoring: continuous pulse ox and blood pressure Approach: midline Location: L3-L4 Injection technique: LOR air  Needle:  Needle type: Tuohy  Needle gauge: 17 G Needle length: 9 cm and 9 Needle insertion depth: 7 cm Catheter type: closed end flexible Catheter size: 19 Gauge Catheter at skin depth: 12 cm Test dose: negative  Assessment Events: blood not aspirated, injection not painful, no injection resistance, no paresthesia and negative IV test  Additional Notes Patient identified. Risks/Benefits/Options discussed with patient including but not limited to bleeding, infection, nerve damage, paralysis, failed block, incomplete pain control, headache, blood pressure changes, nausea, vomiting, reactions to medication both or allergic, itching and postpartum back pain. Confirmed with bedside nurse the patient's most recent platelet count. Confirmed with patient that they are not currently taking any anticoagulation, have any bleeding history or any family history of bleeding disorders. Patient expressed understanding and wished to proceed. All questions were answered. Sterile technique was used throughout the entire procedure. Please see nursing notes for vital signs. Test dose was given through epidural needle and negative prior to continuing to dose epidural or start infusion. Warning signs of high block given to the patient including shortness of breath, tingling/numbness in hands, complete motor block, or any  concerning symptoms with instructions to call for help. Patient was given instructions on fall risk and not to get out of bed. All questions and concerns addressed with instructions to call with any issues.  1 Attempt (S) . Patient tolerated procedure well.

## 2020-04-07 ENCOUNTER — Inpatient Hospital Stay (HOSPITAL_COMMUNITY)
Admission: AD | Admit: 2020-04-07 | Payer: BC Managed Care – PPO | Source: Home / Self Care | Admitting: Obstetrics and Gynecology

## 2020-04-07 ENCOUNTER — Encounter (HOSPITAL_COMMUNITY): Payer: Self-pay | Admitting: Obstetrics & Gynecology

## 2020-04-07 ENCOUNTER — Inpatient Hospital Stay (HOSPITAL_COMMUNITY): Payer: BC Managed Care – PPO

## 2020-04-07 DIAGNOSIS — Z3490 Encounter for supervision of normal pregnancy, unspecified, unspecified trimester: Secondary | ICD-10-CM

## 2020-04-07 LAB — CBC
HCT: 33.7 % — ABNORMAL LOW (ref 36.0–46.0)
Hemoglobin: 11.2 g/dL — ABNORMAL LOW (ref 12.0–15.0)
MCH: 30.9 pg (ref 26.0–34.0)
MCHC: 33.2 g/dL (ref 30.0–36.0)
MCV: 92.8 fL (ref 80.0–100.0)
Platelets: 112 10*3/uL — ABNORMAL LOW (ref 150–400)
RBC: 3.63 MIL/uL — ABNORMAL LOW (ref 3.87–5.11)
RDW: 14.4 % (ref 11.5–15.5)
WBC: 21.7 10*3/uL — ABNORMAL HIGH (ref 4.0–10.5)
nRBC: 0 % (ref 0.0–0.2)

## 2020-04-07 LAB — RPR: RPR Ser Ql: NONREACTIVE

## 2020-04-07 MED ORDER — SERTRALINE HCL 25 MG PO TABS
25.0000 mg | ORAL_TABLET | Freq: Every day | ORAL | Status: DC
  Administered 2020-04-07 – 2020-04-08 (×2): 25 mg via ORAL
  Filled 2020-04-07 (×2): qty 1

## 2020-04-07 MED ORDER — COCONUT OIL OIL: 1.0000 "application " | TOPICAL_OIL | Status: DC | PRN

## 2020-04-07 MED ORDER — ONDANSETRON HCL 4 MG PO TABS: 4.0000 mg | ORAL_TABLET | ORAL | Status: DC | PRN

## 2020-04-07 MED ORDER — SIMETHICONE 80 MG PO CHEW: 80.0000 mg | CHEWABLE_TABLET | ORAL | Status: DC | PRN

## 2020-04-07 MED ORDER — DIBUCAINE (PERIANAL) 1 % EX OINT: 1.0000 "application " | TOPICAL_OINTMENT | CUTANEOUS | Status: DC | PRN

## 2020-04-07 MED ORDER — ACETAMINOPHEN 325 MG PO TABS
650.0000 mg | ORAL_TABLET | ORAL | Status: DC | PRN
  Administered 2020-04-07: 650 mg via ORAL
  Filled 2020-04-07: qty 2

## 2020-04-07 MED ORDER — PRENATAL MULTIVITAMIN CH
1.0000 | ORAL_TABLET | Freq: Every day | ORAL | Status: DC
  Administered 2020-04-07 – 2020-04-08 (×2): 1 via ORAL
  Filled 2020-04-07 (×2): qty 1

## 2020-04-07 MED ORDER — BENZOCAINE-MENTHOL 20-0.5 % EX AERO
1.0000 "application " | INHALATION_SPRAY | CUTANEOUS | Status: DC | PRN
  Administered 2020-04-07 – 2020-04-08 (×2): 1 via TOPICAL
  Filled 2020-04-07 (×2): qty 56

## 2020-04-07 MED ORDER — ONDANSETRON HCL 4 MG/2ML IJ SOLN: 4.0000 mg | INTRAMUSCULAR | Status: DC | PRN

## 2020-04-07 MED ORDER — SENNOSIDES-DOCUSATE SODIUM 8.6-50 MG PO TABS
2.0000 | ORAL_TABLET | ORAL | Status: DC
  Administered 2020-04-07 (×2): 2 via ORAL
  Filled 2020-04-07 (×2): qty 2

## 2020-04-07 MED ORDER — TETANUS-DIPHTH-ACELL PERTUSSIS 5-2.5-18.5 LF-MCG/0.5 IM SUSP: 0.5000 mL | Freq: Once | INTRAMUSCULAR | Status: DC

## 2020-04-07 MED ORDER — WITCH HAZEL-GLYCERIN EX PADS: 1.0000 "application " | MEDICATED_PAD | CUTANEOUS | Status: DC | PRN

## 2020-04-07 MED ORDER — ZOLPIDEM TARTRATE 5 MG PO TABS: 5.0000 mg | ORAL_TABLET | Freq: Every evening | ORAL | Status: DC | PRN

## 2020-04-07 MED ORDER — OXYCODONE-ACETAMINOPHEN 5-325 MG PO TABS: 2.0000 | ORAL_TABLET | ORAL | Status: DC | PRN

## 2020-04-07 MED ORDER — OXYCODONE-ACETAMINOPHEN 5-325 MG PO TABS: 1.0000 | ORAL_TABLET | ORAL | Status: DC | PRN

## 2020-04-07 MED ORDER — DIPHENHYDRAMINE HCL 25 MG PO CAPS: 25.0000 mg | ORAL_CAPSULE | Freq: Four times a day (QID) | ORAL | Status: DC | PRN

## 2020-04-07 MED ORDER — IBUPROFEN 600 MG PO TABS
600.0000 mg | ORAL_TABLET | Freq: Four times a day (QID) | ORAL | Status: DC
  Administered 2020-04-07 – 2020-04-08 (×6): 600 mg via ORAL
  Filled 2020-04-07 (×7): qty 1

## 2020-04-07 NOTE — Progress Notes (Signed)
  CSW completed chart review and attempted to meet with MOB in room 515.   When CSW arrived MOB, FOB, and infant were asleep.  CSW will attempt to meet with MOB at a later time to complete an assessment for MH hx.   Laurey Arrow, MSW, LCSW Clinical Social Work [phone]

## 2020-04-07 NOTE — Progress Notes (Signed)
Post Partum Day 1 Subjective: no complaints, up ad lib, voiding and tolerating PO  Objective: Blood pressure (!) 80/56, pulse 89, temperature 97.9 F (36.6 C), temperature source Oral, resp. rate 18, height 5\' 2"  (1.575 m), weight 77.1 kg, last menstrual period 07/30/2019, SpO2 99 %, unknown if currently breastfeeding.  Physical Exam:  General: alert Lochia: appropriate Uterine Fundus: firm Incision: healing well DVT Evaluation: No evidence of DVT seen on physical exam.  Recent Labs    04/06/20 1734 04/07/20 0442  HGB 12.4 11.2*  HCT 37.5 33.7*    Assessment/Plan: Plan for discharge tomorrow, Breastfeeding and Circumcision prior to discharge D/W patient female infant circumcision, risks/benefits reviewed. All questions answered.    LOS: 1 day    04/09/20 04/07/2020, 10:00 AM

## 2020-04-07 NOTE — Anesthesia Postprocedure Evaluation (Signed)
Anesthesia Post Note  Patient: Julia Hansen  Procedure(s) Performed: AN AD HOC LABOR EPIDURAL     Patient location during evaluation: Mother Baby Anesthesia Type: Epidural Level of consciousness: awake and alert and oriented Pain management: satisfactory to patient Vital Signs Assessment: post-procedure vital signs reviewed and stable Respiratory status: respiratory function stable Cardiovascular status: stable Postop Assessment: no headache, no backache, epidural receding, patient able to bend at knees, no signs of nausea or vomiting and adequate PO intake Anesthetic complications: no    Last Vitals:  Vitals:   04/07/20 0230 04/07/20 0607  BP: 107/71 98/67  Pulse: (!) 110 98  Resp: 18 18  Temp: 36.9 C 36.8 C  SpO2: 98% 99%    Last Pain:  Vitals:   04/07/20 0710  TempSrc:   PainSc: 2    Pain Goal:                   Dariya Gainer

## 2020-04-07 NOTE — Lactation Note (Signed)
This note was copied from a baby's chart. Lactation Consultation Note  Patient Name: Boy Zuzanna Maroney [id number] Date: 04/07/2020  Attempt to see mom.  Visitors in room.  Mom asked to come back in a few minutes   Maternal Data    Feeding    LATCH Score                   Interventions    Lactation Tools Discussed/Used     Consult Status      Shakiara Lukic Michaelle Copas 04/07/2020, 2:10 PM

## 2020-04-07 NOTE — Lactation Note (Addendum)
This note was copied from a baby's chart. Lactation Consultation Note  Patient Name: Julia Hansen [id number] Date: 04/07/2020 Reason for consult: Initial assessment;Term P2, 4 hour term female infant. Mom with hx: ADHD , panic attacks,  Milk over supply and mastitis after engorgement . Infant had one stool.  Mom is experienced at breastfeeding she breastfed her 36 year old son for 5 months was seen in [facility] outpatient clinic and used NS. Per mom , she had mastitis and engorgement and hx of over supply. Mom right breast is semi short shafted but responses to stimulation well mom had NS on desk , LC explained with 2nd baby latching well she doesn't need to use NS. Per mom, infant did not latch in L&D, latched in room and breastfed for 20 minutes. LC reviewed hand expression and mom easily expressed 10 mls of colostrum in bullet, infant was given 5 mls by spoon. Mom will offer the other 5 mls at next feeding after infant is latched at breast. LC entered room , infant was cuing to breastfed, LC ask mom to do breast stimulation and mom latched infant on right breast using the football hold position, infant latched with top lip flanged out , nose and chin touching breast , swallowed observed and heard. Infant was still breastfeeding after LC left the room. Mom knows to breastfed according to hunger cues, on demand, 8 to 12 times within 24 hours and not exceed 3 hours when feeding infant.  Parents will continue to do STS as much as possible. Reviewed Baby & Me book's Breastfeeding Basics.  Mom made aware of O/P services, breastfeeding support groups, community resources, and our phone # for post-discharge questions.   Maternal Data Formula Feeding for Exclusion: Yes Reason for exclusion: Mother's choice to formula and breast feed on admission Has patient been taught Hand Expression?: Yes Does the patient have breastfeeding experience prior to this delivery?: Yes  Feeding Feeding Type: Breast  Fed  LATCH Score Latch: Grasps breast easily, tongue down, lips flanged, rhythmical sucking.  Audible Swallowing: Spontaneous and intermittent  Type of Nipple: Everted at rest and after stimulation  Comfort (Breast/Nipple): Soft / non-tender  Hold (Positioning): Assistance needed to correctly position infant at breast and maintain latch.  LATCH Score: 9  Interventions Interventions: Breast feeding basics reviewed;Breast compression;Adjust position;Assisted with latch;Hand pump;Support pillows;Skin to skin;Position options;Breast massage;Hand express;Expressed milk;Pre-pump if needed  Lactation Tools Discussed/Used Tools: Pump;Nipple Shields Nipple shield size: 20(Mom requested given by RN due using with 1st child, mom doesn't need NS at this time.) WIC Program: No Pump Review: Setup, frequency, and cleaning;Milk Storage Initiated by:: Danelle Earthly, IBCLC Date initiated:: 04/07/20   Consult Status Consult Status: Follow-up Date: 04/07/20 Follow-up type: In-patient    Danelle Earthly 04/07/2020, 3:21 AM

## 2020-04-07 NOTE — Progress Notes (Signed)
CSW received consult for hx of Anxiety and Depression.  CSW met with MOB to offer support and complete assessment.    When CSW arrived, MOB and FOB were eating lunch and MGM was just arriving.  CSW explained CSW's role and MOB gave CSW permission to complete assessment while her guest were present. MGM and FOB appeared supportive of MOB and infant.   CSW asked about MOB's MH hx and MOB acknowledged a hx of anx/dep.  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  MOB shared that MOB discontinued her Zoloft during the pregnancy with her first baby and PPD symptoms were challenging.  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 Per MOB, she is anticipating a better outcome with her MH and reported that she has hired help and a postpartum plan in place. CSW praised MOB for being proactive.   CSW provided education regarding the baby blues period vs. perinatal mood disorders, discussed treatment and gave resources for mental health follow up if concerns arise.  CSW recommends self-evaluation during the postpartum time period using the New Mom Checklist from Postpartum Progress and encouraged MOB to contact a medical professional if symptoms are noted at any time. MOB did not present with any MH symptoms and appeared to have insight and awareness. CSW assessed for safety and MOB denied SI and HI and reported feeling comfortable seeking help if help is needed.   CSW identifies no further need for intervention and no barriers to discharge at this time.  Laurey Arrow, MSW, LCSW Clinical Social Work [phone]

## 2020-04-07 NOTE — Lactation Note (Signed)
This note was copied from a baby's chart. Lactation Consultation Note  Patient Name: Julia Hansen [id number] Date: 04/07/2020 Reason for consult: Follow-up assessment [name] now 89 hours old.Mom already had started breastfeeding on arrival.  In football on right breast. Mom reports her left nipple was bleeding when infant came off last time.  Mom reports almost a 4 hour stretch since previous feed.  Mom reprots it is a little uncomfortable with feeding right now.  Mom has his feet turned underneath him and his head in close but his body far away.  Try to reposition him where he was.  Unable to get mom comfortable.Urged mom to break suction and relatch him. Mom did and nipple compressed.   Assisted mom in football on right.  Infant latched breastfed well.  Moms nipple not compressed.  Fells asleep.  After a few minutes cuing again.  Assisted mom with Latching on left breast in both cross cradle  And laid back.  Nipple round and elongated when he let go and came off. After a few minutes rooting again. Assisted in relatching.  Left mom and baby breastfeeding.  Urged to call lactation as needed.  Urged mom to call lactation as needed.     Maternal Data Has patient been taught Hand Expression?: Yes  Feeding Feeding Type: Breast Fed  LATCH Score Latch: Grasps breast easily, tongue down, lips flanged, rhythmical sucking.  Audible Swallowing: A few with stimulation  Type of Nipple: Everted at rest and after stimulation  Comfort (Breast/Nipple): Filling, red/small blisters or bruises, mild/mod discomfort  Hold (Positioning): Assistance needed to correctly position infant at breast and maintain latch.  LATCH Score: 7  Interventions Interventions: Breast feeding basics reviewed;Assisted with latch;Skin to skin;Hand express;Adjust position;Support pillows;Position options;Expressed milk;Hand pump  Lactation Tools Discussed/Used     Consult Status Consult Status:  Follow-up Date: 04/08/20 Follow-up type: In-patient    Mendy Lapinsky Michaelle Copas 04/07/2020, 4:16 PM

## 2020-04-08 NOTE — Lactation Note (Signed)
This note was copied from a baby's chart. Lactation Consultation Note: Mother is a P2 , 65 infant is hours old and is now .  Infant returned from having a his circumcision. Mother request assistance with latching. She reports that she is having pain with latch. Mother has a crack on the right nipple.   Reviewed hand expression with mother. Observed large drops of colostrum. Mother    Mother was observed with infant latched on at the left breast. Observed infant suckling with audible swallows. Infant sustained latch for 15 mins.   Discussed treatment and prevention of engorgement.   Mother to continue to cue base feed infant and feed at least 8-12 times or more in 24 hours and advised to allow for cluster feeding infant as needed.   Mother to continue to due STS. Mother is aware of available LC services at [facility], BFSG'S, OP Dept, and phone # for questions or concerns about breastfeeding.  Mother receptive to all teaching and plan of care.    Patient Name: Julia Hansen Date: 04/08/2020 Reason for consult: Follow-up assessment   Maternal Data    Feeding Feeding Type: Breast Fed  LATCH Score Latch: Grasps breast easily, tongue down, lips flanged, rhythmical sucking.  Audible Swallowing: A few with stimulation  Type of Nipple: Everted at rest and after stimulation  Comfort (Breast/Nipple): Soft / non-tender  Hold (Positioning): No assistance needed to correctly position infant at breast.  LATCH Score: 9  Interventions Interventions: Assisted with latch;Skin to skin;Hand express;Breast compression;Adjust position;Support pillows;Position options;Comfort gels;Hand pump  Lactation Tools Discussed/Used     Consult Status Consult Status: Follow-up Date: 04/09/20 Follow-up type: In-patient    Stevan Born [facility] 04/08/2020, 2:24 PM

## 2020-04-08 NOTE — Discharge Summary (Signed)
OB Discharge Summary     Patient Name: Julia Hansen DOB: [birth year] MRN: [id number]  Date of admission: 04/06/2020 Delivering MD: Mitchel Honour   Date of discharge: 04/08/2020  Admitting diagnosis: Pregnant and not yet delivered in third trimester [Z34.93] Pregnancy [Z34.90] Intrauterine pregnancy: [redacted]w[redacted]d     Secondary diagnosis:  Active Problems:   Pregnant and not yet delivered in third trimester   Pregnancy  Additional problems: none     Discharge diagnosis: Term Pregnancy Delivered                                                                                                Post partum procedures:none  Augmentation: AROM  Complications: None  Hospital course:  Onset of Labor With Vaginal Delivery     34 y.o. yo [id number] at [redacted]w[redacted]d was admitted in Active Labor on 04/06/2020. Patient had an uncomplicated labor course as follows:  Membrane Rupture Time/Date: 8:56 PM ,04/06/2020   Intrapartum Procedures: Episiotomy: None [1]                                         Lacerations:  Vaginal [6]  Patient had a delivery of a Viable infant. 04/06/2020  Information for the patient's newborn:  Julia, Hansen [[id number]]  Delivery Method: Vaginal, Spontaneous(Filed from Delivery Summary)     Pateint had an uncomplicated postpartum course.  She is ambulating, tolerating a regular diet, passing flatus, and urinating well. Patient is discharged home in stable condition on 04/08/20.   Physical exam  Vitals:   04/07/20 1330 04/07/20 1859 04/07/20 2203 04/08/20 0520  BP: 102/72 93/67 91/62  (!) 90/57  Pulse: 83 91 80 79  Resp: 18  17 18   Temp: 98.1 F (36.7 C) 98.1 F (36.7 C) 98.1 F (36.7 C) 97.8 F (36.6 C)  TempSrc: Oral Oral Oral Oral  SpO2: 99%  98% 100%  Weight:      Height:       General: alert Lochia: appropriate Uterine Fundus: firm Incision: Healing well with no significant drainage DVT Evaluation: No evidence of DVT seen on physical exam. Labs: Lab  Results  Component Value Date   WBC 21.7 (H) 04/07/2020   HGB 11.2 (L) 04/07/2020   HCT 33.7 (L) 04/07/2020   MCV 92.8 04/07/2020   PLT 112 (L) 04/07/2020   CMP Latest Ref Rng & Units 04/06/2020  Glucose 70 - 99 mg/dL 04/08/2020)  BUN 6 - 20 mg/dL 8  Creatinine 702(O - 3.78 mg/dL 5.88  Sodium 5.02 - 774 mmol/L 136  Potassium 3.5 - 5.1 mmol/L 4.0  Chloride 98 - 111 mmol/L 104  CO2 22 - 32 mmol/L 21(L)  Calcium 8.9 - 10.3 mg/dL 9.0  Total Protein 6.5 - 8.1 g/dL 6.1(L)  Total Bilirubin 0.3 - 1.2 mg/dL 0.5  Alkaline Phos 38 - 126 U/L 109  AST 15 - 41 U/L 27  ALT 0 - 44 U/L 24    Discharge instruction: per After Visit Summary and "Baby and Me Booklet".  After visit meds:  Allergies as of 04/08/2020      Reactions   Sulfa Antibiotics    Trazodone And Nefazodone    Patient had immediate dizziness and shortness of breath and "fell on the floor".  Husband had to call EMS not sure if it is a panic attack vs. Possible medication reaction.     Prednisone Anxiety, Other (See Comments)   Patient states it "makes her psycho."      Medication List    TAKE these medications   calcium carbonate 500 MG chewable tablet Commonly known as: TUMS - dosed in mg elemental calcium Chew 1-2 tablets by mouth 2 (two) times daily as needed for indigestion or heartburn.   DHA OMEGA 3 PO Take 1 capsule by mouth 2 (two) times daily.   multivitamin with minerals tablet Take 1 tablet by mouth daily.   sertraline 25 MG tablet Commonly known as: ZOLOFT Take 1-2 tablets (25-50 mg total) by mouth daily. Take 1 tab alternating with 2 every other day for 14 days. After that period  take 50 mg daily.       Diet: routine diet  Activity: Advance as tolerated. Pelvic rest for 6 weeks.   Outpatient follow up:6 weeks Follow up Appt: Future Appointments  Date Time Provider [place]  07/27/2020 11:30 AM Princess Bruins, MD [facility]   Follow up Visit:No follow-ups on file.  Postpartum  contraception: Not Discussed  Newborn Data: Live born female  Birth Weight: 7 lb 1.2 oz (3209 g) APGAR: 5, 7  Newborn Delivery   Birth date/time: 04/06/2020 23:20:00 Delivery type: Vaginal, Spontaneous      Baby Feeding: Breast Disposition:home with mother   04/08/2020 Julia Dense, MD

## 2020-04-10 LAB — SURGICAL PATHOLOGY

## 2020-07-27 ENCOUNTER — Encounter: Payer: BC Managed Care – PPO | Admitting: Obstetrics & Gynecology

## 2020-08-19 IMAGING — CT CT ANGIO CHEST
2 of 7 series · 18 of 46 positions shown · IV contrast (APPLIED)
Comparison: None.

CLINICAL DATA: Shortness of breath.

EXAM:
CT ANGIOGRAPHY CHEST WITH CONTRAST
TECHNIQUE: Multidetector CT imaging of the chest was performed using the
standard protocol during bolus administration of intravenous
contrast. Multiplanar CT image reconstructions and MIPs were
obtained to evaluate the vascular anatomy.
CONTRAST:  65mL OMNIPAQUE IOHEXOL 350 MG/ML SOLN

[Series 8: thins · axial · 0.65mm/px · z∈[-120,+53]mm · 15 of 279 slices shown]
[im 16/279  lung]
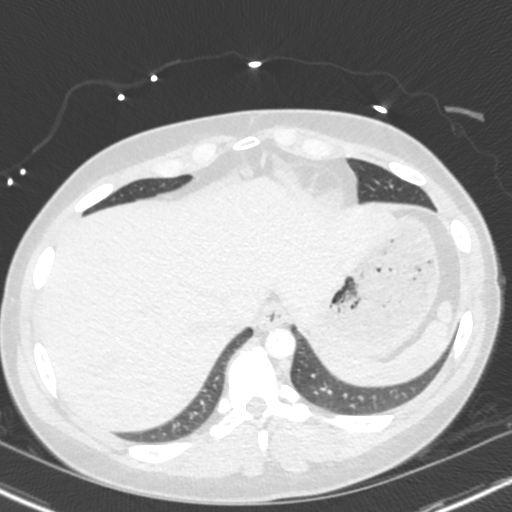
[im 31/279  soft-tissue]
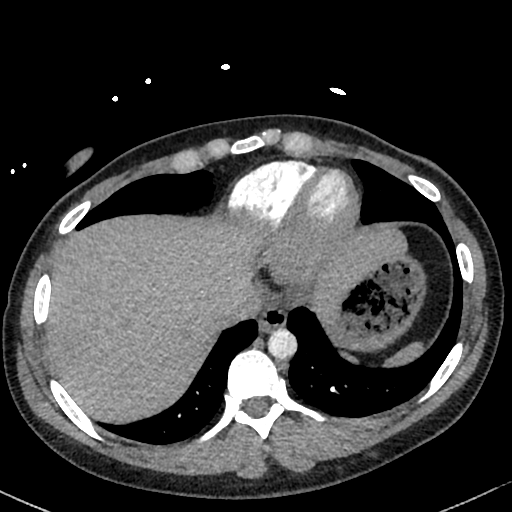
[im 47/279  lung]
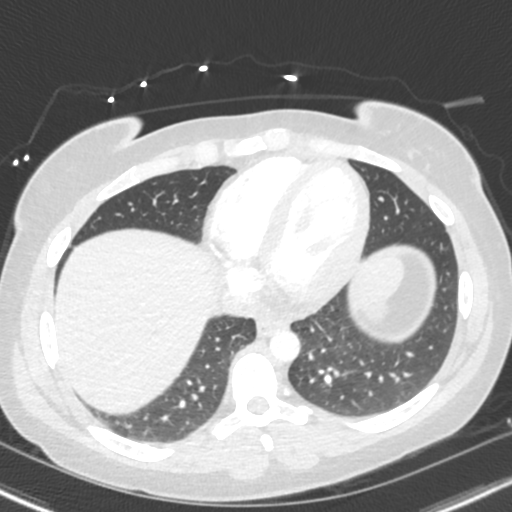
[im 62/279  soft-tissue]
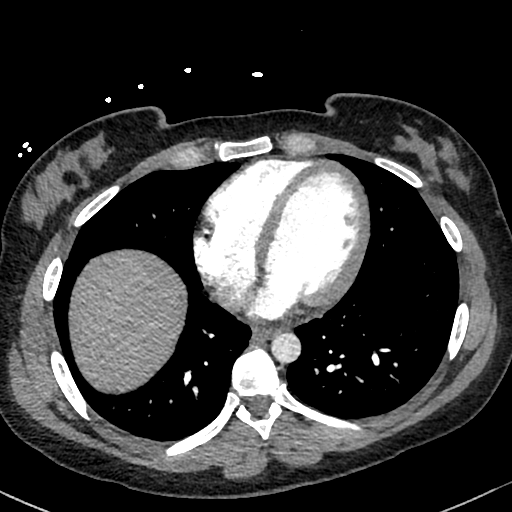
[im 93/279  lung]
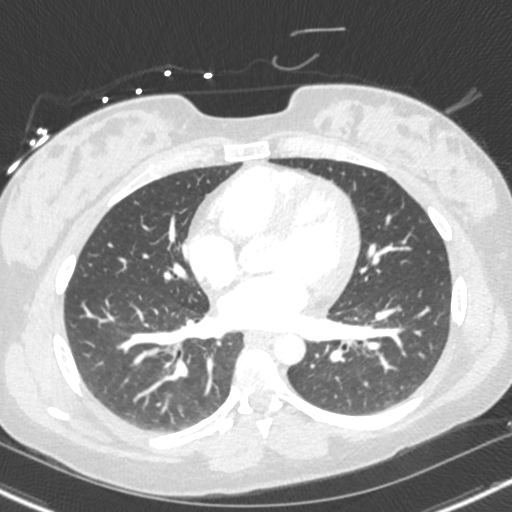
[im 109/279  soft-tissue]
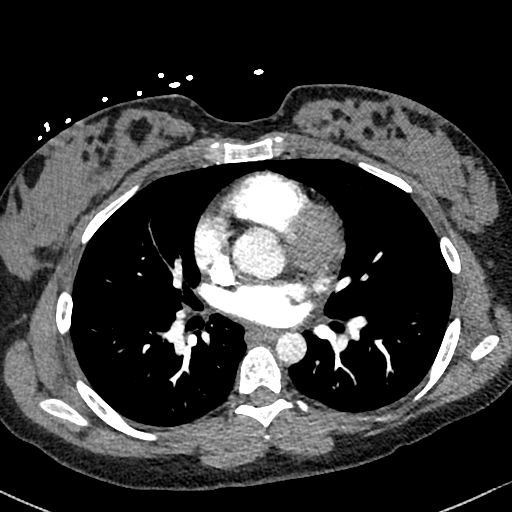
[im 124/279  lung]
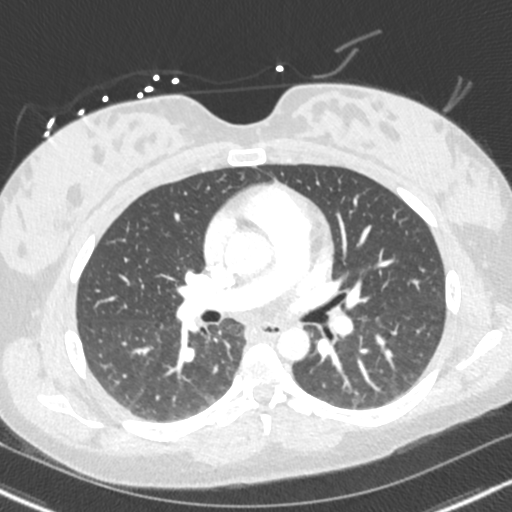
[im 140/279  soft-tissue]
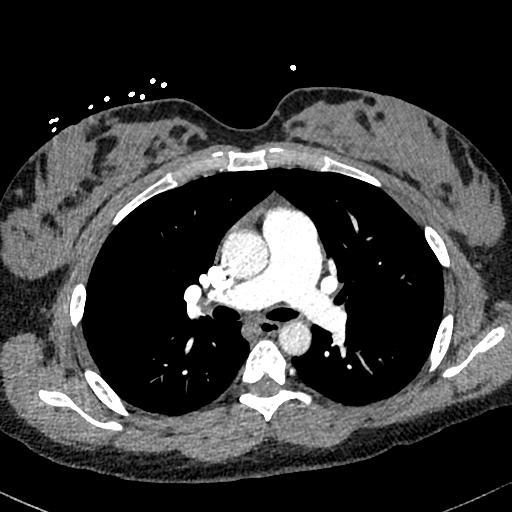
[im 155/279  lung]
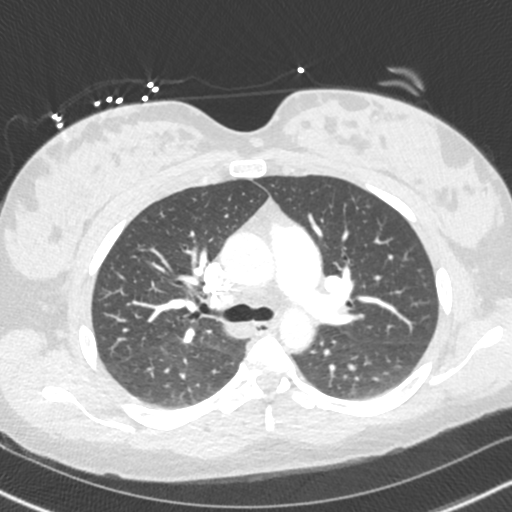
[im 170/279  soft-tissue]
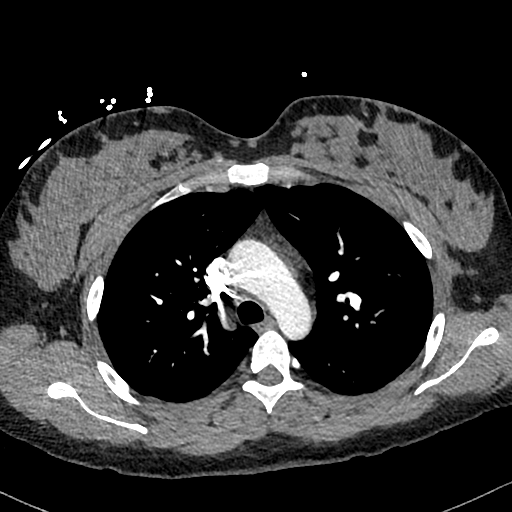
[im 186/279  lung]
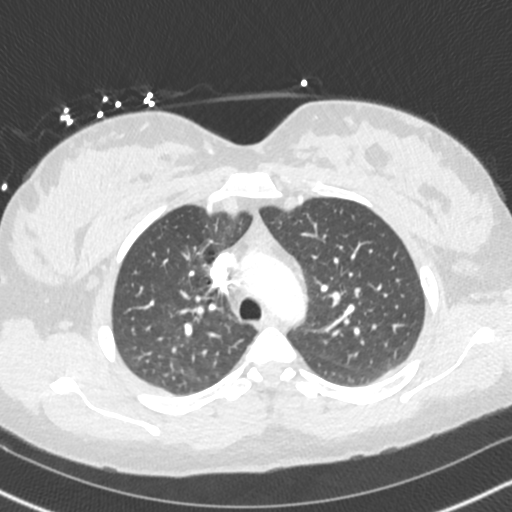
[im 217/279  soft-tissue]
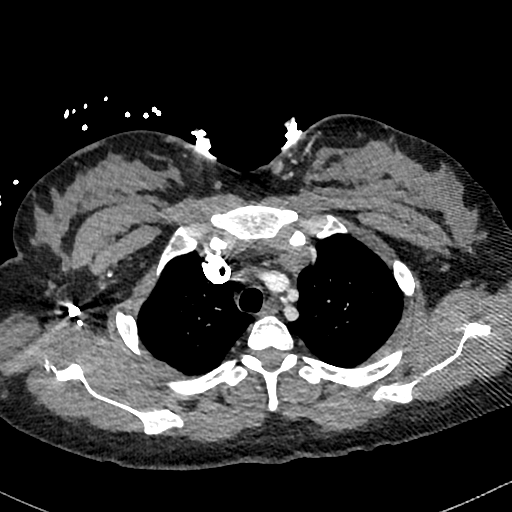
[im 232/279  lung]
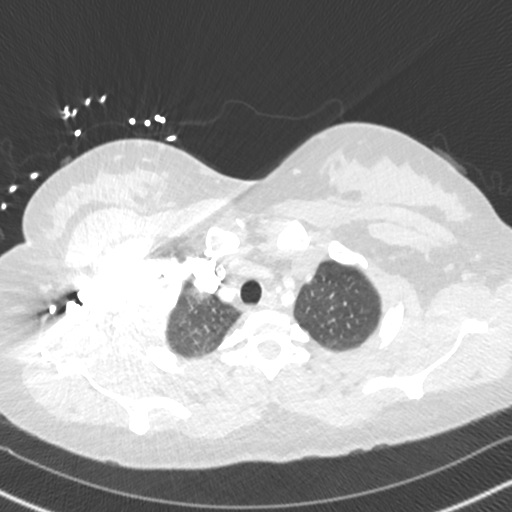
[im 248/279  soft-tissue]
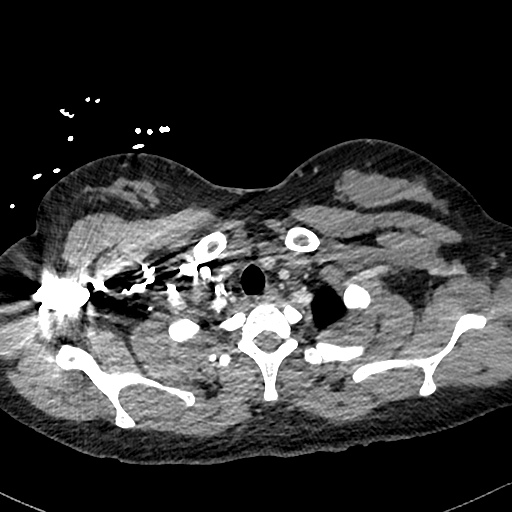
[im 263/279  lung]
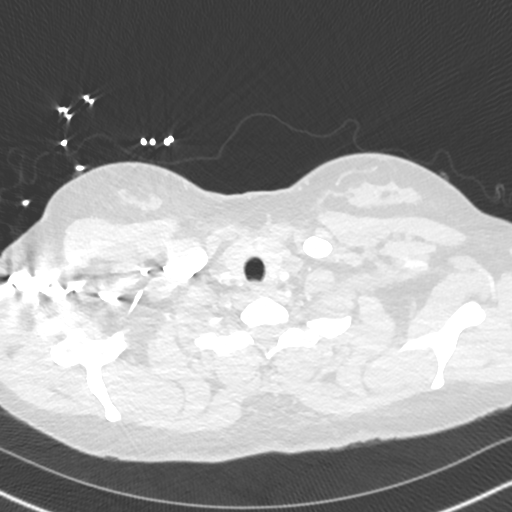

[Series 9: cor · coronal · 0.44mm/px · 3 of 139 slices shown]
[im 35/139  soft-tissue]
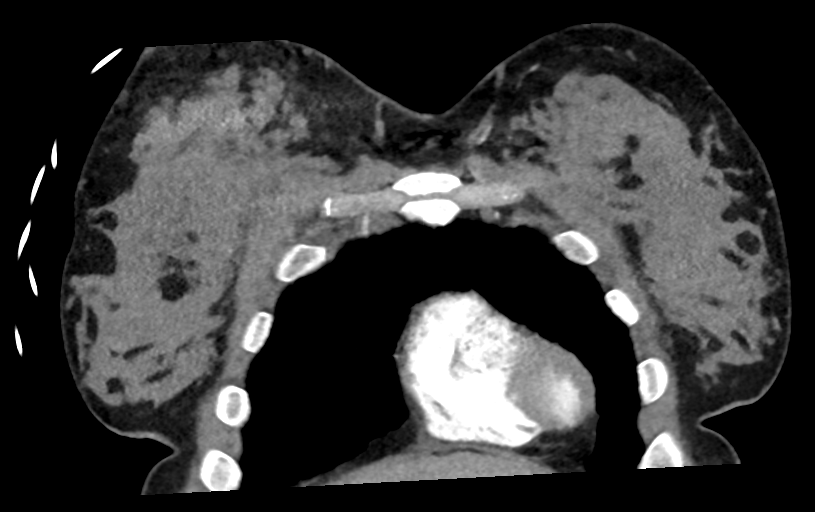
[im 70/139  soft-tissue]
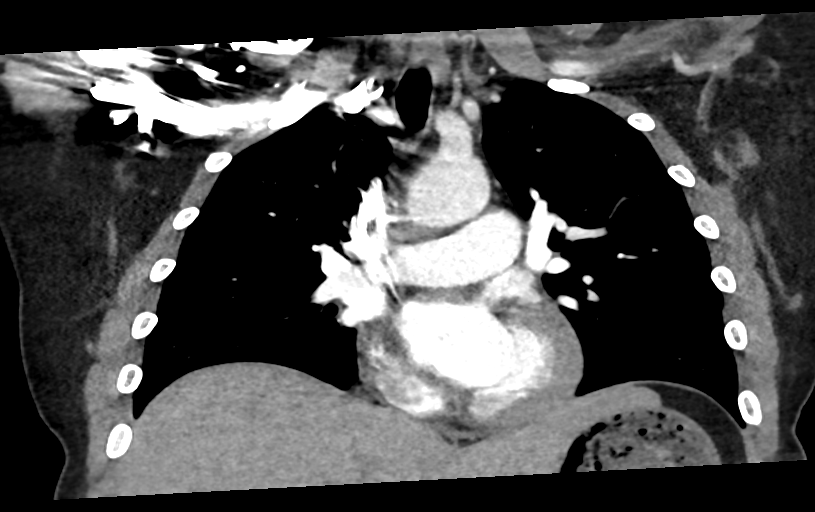
[im 104/139  soft-tissue]
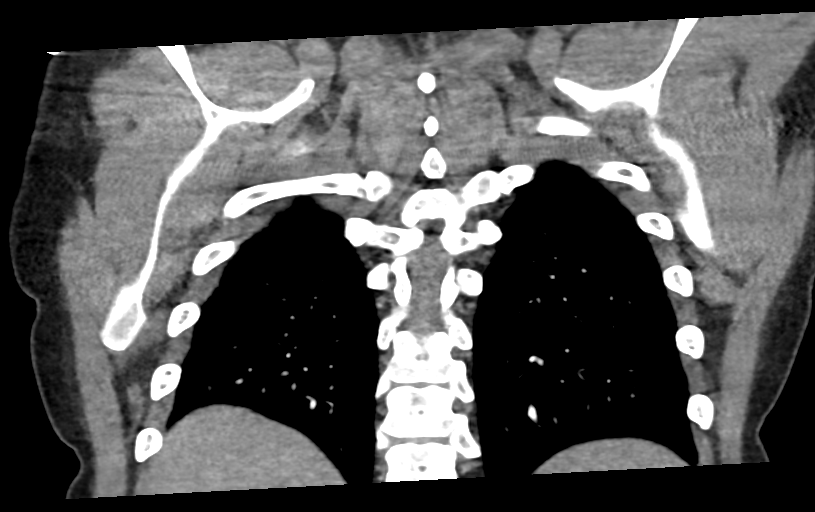

[18 of 46 positions shown; findings below may reference images not displayed]

FINDINGS: Cardiovascular: Satisfactory opacification of the pulmonary arteries
to the segmental level. No evidence of pulmonary embolism. Normal
heart size. No pericardial effusion.

Mediastinum/Nodes: No enlarged mediastinal, hilar, or axillary lymph
nodes. Thyroid gland, trachea, and esophagus demonstrate no
significant findings.

Lungs/Pleura: Lungs are clear. No pleural effusion or pneumothorax.

Upper Abdomen: No acute abnormality.

Musculoskeletal: No chest wall abnormality. No acute or significant
osseous findings.

Review of the MIP images confirms the above findings.
IMPRESSION: No evidence of pulmonary embolism or acute cardiopulmonary disease.

## 2020-10-02 ENCOUNTER — Encounter: Payer: Self-pay | Admitting: Family Medicine

## 2020-10-02 ENCOUNTER — Ambulatory Visit: Payer: Self-pay

## 2020-10-02 ENCOUNTER — Other Ambulatory Visit: Payer: Self-pay

## 2020-10-02 ENCOUNTER — Ambulatory Visit (INDEPENDENT_AMBULATORY_CARE_PROVIDER_SITE_OTHER): Payer: BC Managed Care – PPO | Admitting: Family Medicine

## 2020-10-02 DIAGNOSIS — R221 Localized swelling, mass and lump, neck: Secondary | ICD-10-CM

## 2020-10-02 DIAGNOSIS — M25511 Pain in right shoulder: Secondary | ICD-10-CM | POA: Diagnosis not present

## 2020-10-02 DIAGNOSIS — G8929 Other chronic pain: Secondary | ICD-10-CM | POA: Diagnosis not present

## 2020-10-02 NOTE — Progress Notes (Signed)
Office Visit Note   Patient: Julia Hansen           Date of Birth: 01/05/1986           MRN: [id number] Visit Date: 10/02/2020 Requested by: No referring provider defined for this encounter. PCP: Patient, No Pcp Per  Subjective: Chief Complaint  Patient presents with  . Neck - Mass    Knot posterior neck/upper back area x 1 month. Tingling sensation right side of upper back.     HPI: She is here with a couple concerns.  For the past few months she has noticed a prominence on the back of her neck/upper back.  No injury, she feels like she is starting to develop a hump.  She had a baby 5 months ago and is just about finished breast-feeding him.  She has not taken any medication for this.  It is slightly sore.  She has not noticed any fevers or chills, night sweats, unintentional weight change.  She has expected level of fatigue related to having a new baby.  She also struggles with chronic right shoulder pain.  She had a labrum tear repaired years ago, never felt better afterward.  She had an MRI arthrogram in 2016 which was unrevealing.  She has pain primarily when hitting a forehand while playing tennis.  She is a Landscape architect.              ROS:   All other systems were reviewed and are negative.  Objective: Vital Signs: There were no vitals taken for this visit.  Physical Exam:  General:  Alert and oriented, in no acute distress. Pulm:  Breathing unlabored. Psy:  Normal mood, congruent affect. Skin: No rash on her upper back. Upper back: She has poor posture, head forward with a pelvic tilt.  She is able to maintain a good posture when corrected.  She has soft tissue prominence near the C7 spinous process, feels like fatty tissue. Right shoulder: She is tender deep in the axillary region posterior to the pectoralis tendon.  I believe this is over the biceps muscle.  Rotator cuff strength is 5/5 throughout.  Speeds test negative, O'Brien's test is  negative.  Imaging: Limited diagnostic ultrasound of the upper back reveals fatty tissue which has no worrisome characteristics.  No hyperemia on power Doppler imaging.  There is no fluid collection.   Assessment & Plan: 1.  Upper back mass, probably fatty collection.  Suspect lipoma. -I think this may be related to hormone changes.  Once she finishes breast-feeding, she will wait a couple months and if the area is not any smaller, consider additional imaging. -Referral to [facility] for help with posture.  2.  Chronic right shoulder pain -We will order a new MRI arthrogram to further evaluate.     Procedures: No procedures performed  No notes on file     PMFS History: Patient Active Problem List   Diagnosis Date Noted  . Pregnancy 04/07/2020  . Pregnant and not yet delivered in third trimester 04/06/2020  . Vaginal bleeding in pregnancy, third trimester 02/19/2020  . Nonallopathic lesion of cervical region 01/23/2018  . Nonallopathic lesion of thoracic region 01/23/2018  . Nonallopathic lesion of rib cage 01/23/2018  . Cervicogenic headache 01/16/2018  . 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 SVD (spontaneous vaginal delivery) (7/16) 06/24/2016  . Postpartum care following vaginal delivery (7/16) 06/24/2016  . Postpartum state 06/23/2016  . Preterm premature rupture of membranes  06/21/2016   Past Medical History:  Diagnosis Date  . Anxiety   . Blood in urine   . History of fainting   . Insomnia   . Low back pain   . Macular eruption   . Panic attacks   . 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     Family History  Problem Relation Age of Onset  . Hypertension Mother   . ADD / ADHD Father   . COPD Paternal Grandmother   . Cancer Paternal Grandfather     Past Surgical History:  Procedure Laterality Date  . COLONOSCOPY    . MOLE REMOVAL    . MOUTH SURGERY    . NASAL ENDOSCOPY    . ROTATOR CUFF REPAIR    . SHOULDER  SURGERY Right   . TONSILLECTOMY    . WISDOM TOOTH EXTRACTION     Social History   Occupational History  . Occupation: Corporate treasurer  Tobacco Use  . Smoking status: Never Smoker  . Smokeless tobacco: Never Used  Vaping Use  . Vaping Use: Never used  Substance and Sexual Activity  . Alcohol use: No  . Drug use: No  . Sexual activity: Yes    Partners: Male    Birth control/protection: None    Comment: 1st intercourse- 19, partners- 4, married- 3 yrs

## 2020-10-09 ENCOUNTER — Ambulatory Visit: Payer: BC Managed Care – PPO | Admitting: Orthopedic Surgery

## 2020-10-10 ENCOUNTER — Other Ambulatory Visit: Payer: Self-pay | Admitting: Family Medicine

## 2020-10-10 DIAGNOSIS — M7989 Other specified soft tissue disorders: Secondary | ICD-10-CM

## 2020-10-18 ENCOUNTER — Ambulatory Visit
Admission: RE | Admit: 2020-10-18 | Discharge: 2020-10-18 | Disposition: A | Discharge status: Home / Self Care | Payer: BC Managed Care – PPO | Source: Ambulatory Visit | Attending: Family Medicine | Admitting: Family Medicine

## 2020-10-18 ENCOUNTER — Other Ambulatory Visit: Payer: Self-pay

## 2020-10-18 DIAGNOSIS — M25511 Pain in right shoulder: Secondary | ICD-10-CM

## 2020-10-18 DIAGNOSIS — G8929 Other chronic pain: Secondary | ICD-10-CM

## 2020-10-18 MED ORDER — IOPAMIDOL (ISOVUE-M 200) INJECTION 41%
15.0000 mL | Freq: Once | INTRAMUSCULAR | Status: AC
  Administered 2020-10-18: 15 mL via INTRA_ARTICULAR

## 2020-10-19 ENCOUNTER — Telehealth: Payer: Self-pay | Admitting: Family Medicine

## 2020-10-19 NOTE — Telephone Encounter (Signed)
MRI shows fraying of the labrum but no tear.  There is irritation of the rotator cuff but no tear.  There is bursitis.  No clear-cut indication for surgery at this point.

## 2020-10-24 ENCOUNTER — Other Ambulatory Visit: Payer: Self-pay | Admitting: Family Medicine

## 2020-10-24 ENCOUNTER — Ambulatory Visit
Admission: RE | Admit: 2020-10-24 | Discharge: 2020-10-24 | Disposition: A | Discharge status: Home / Self Care | Payer: BC Managed Care – PPO | Source: Ambulatory Visit | Attending: Family Medicine | Admitting: Family Medicine

## 2020-10-24 DIAGNOSIS — M7989 Other specified soft tissue disorders: Secondary | ICD-10-CM

## 2020-11-19 ENCOUNTER — Emergency Department (HOSPITAL_BASED_OUTPATIENT_CLINIC_OR_DEPARTMENT_OTHER)
Admission: EM | Admit: 2020-11-19 | Discharge: 2020-11-19 | Disposition: A | Discharge status: Home / Self Care | Payer: BC Managed Care – PPO | Attending: Emergency Medicine | Admitting: Emergency Medicine

## 2020-11-19 ENCOUNTER — Encounter (HOSPITAL_BASED_OUTPATIENT_CLINIC_OR_DEPARTMENT_OTHER): Payer: Self-pay | Admitting: Emergency Medicine

## 2020-11-19 ENCOUNTER — Emergency Department (HOSPITAL_BASED_OUTPATIENT_CLINIC_OR_DEPARTMENT_OTHER): Payer: BC Managed Care – PPO

## 2020-11-19 DIAGNOSIS — R102 Pelvic and perineal pain: Secondary | ICD-10-CM | POA: Diagnosis present

## 2020-11-19 DIAGNOSIS — R1012 Left upper quadrant pain: Secondary | ICD-10-CM | POA: Diagnosis not present

## 2020-11-19 DIAGNOSIS — R1030 Lower abdominal pain, unspecified: Secondary | ICD-10-CM | POA: Insufficient documentation

## 2020-11-19 DIAGNOSIS — R10814 Left lower quadrant abdominal tenderness: Secondary | ICD-10-CM | POA: Diagnosis not present

## 2020-11-19 DIAGNOSIS — N200 Calculus of kidney: Secondary | ICD-10-CM | POA: Insufficient documentation

## 2020-11-19 LAB — URINALYSIS, MICROSCOPIC (REFLEX): RBC / HPF: >50 RBC/hpf (ref 0–5)

## 2020-11-19 LAB — CBC WITH DIFFERENTIAL/PLATELET
Abs Immature Granulocytes: 0.02 10*3/uL (ref 0.00–0.07)
Basophils Absolute: 0 10*3/uL (ref 0.0–0.1)
Basophils Relative: 1 %
Eosinophils Absolute: 0.1 10*3/uL (ref 0.0–0.5)
Eosinophils Relative: 1 %
HCT: 41.5 % (ref 36.0–46.0)
Hemoglobin: 14.3 g/dL (ref 12.0–15.0)
Immature Granulocytes: 0 %
Lymphocytes Relative: 29 %
Lymphs Abs: 1.8 10*3/uL (ref 0.7–4.0)
MCH: 31.2 pg (ref 26.0–34.0)
MCHC: 34.5 g/dL (ref 30.0–36.0)
MCV: 90.4 fL (ref 80.0–100.0)
Monocytes Absolute: 0.4 10*3/uL (ref 0.1–1.0)
Monocytes Relative: 6 %
Neutro Abs: 3.9 10*3/uL (ref 1.7–7.7)
Neutrophils Relative %: 63 %
Platelets: 189 10*3/uL (ref 150–400)
RBC: 4.59 MIL/uL (ref 3.87–5.11)
RDW: 13 % (ref 11.5–15.5)
WBC: 6.2 10*3/uL (ref 4.0–10.5)
nRBC: 0 % (ref 0.0–0.2)

## 2020-11-19 LAB — URINALYSIS, ROUTINE W REFLEX MICROSCOPIC
Bilirubin Urine: NEGATIVE
Glucose, UA: NEGATIVE mg/dL
Ketones, ur: NEGATIVE mg/dL
Leukocytes,Ua: NEGATIVE
Nitrite: NEGATIVE
Protein, ur: NEGATIVE mg/dL
Specific Gravity, Urine: 1.02 (ref 1.005–1.030)
pH: 6 (ref 5.0–8.0)

## 2020-11-19 LAB — BASIC METABOLIC PANEL
Anion gap: 10 (ref 5–15)
BUN: 13 mg/dL (ref 6–20)
CO2: 24 mmol/L (ref 22–32)
Calcium: 9.6 mg/dL (ref 8.9–10.3)
Chloride: 102 mmol/L (ref 98–111)
Creatinine, Ser: 0.75 mg/dL (ref 0.44–1.00)
GFR, Estimated: >60 mL/min (ref >60)
Glucose, Bld: 99 mg/dL (ref 70–99)
Potassium: 3.7 mmol/L (ref 3.5–5.1)
Sodium: 136 mmol/L (ref 135–145)

## 2020-11-19 LAB — PREGNANCY, URINE: Preg Test, Ur: NEGATIVE

## 2020-11-19 MED ORDER — CEPHALEXIN 500 MG PO CAPS: 500.0000 mg | ORAL_CAPSULE | Freq: Two times a day (BID) | ORAL | 0 refills | Status: AC

## 2020-11-19 MED ORDER — KETOROLAC TROMETHAMINE 30 MG/ML IJ SOLN
30.0000 mg | Freq: Once | INTRAMUSCULAR | Status: AC
  Administered 2020-11-19: 30 mg via INTRAVENOUS
  Filled 2020-11-19: qty 1

## 2020-11-19 NOTE — ED Triage Notes (Signed)
Low abd pain x 1 week. She saw her GYN Thursday and had a neg [place]. Denies N/V/D. Denies vaginal discharge and dysuria.

## 2020-11-19 NOTE — ED Provider Notes (Signed)
[facility] EMERGENCY DEPARTMENT Provider Note   CSN: [id number] Arrival date & time: 11/19/20  1356     History Chief Complaint  Patient presents with  . Abdominal Pain    Julia Hansen is a 34 y.o. female.  HPI   Patient with no significant medical history presents to the emergency department with chief complaint of lower pelvic pain.  Patient states pain started on Wednesday and has stayed consistent.  She describes the pain as a cramping-like sensation which radiates into her pelvis and left groin.  She denies urinary symptoms, vaginal discharge, worsening vaginal bleeding.  She denies abdominal pain, nausea, vomiting, diarrhea, but does endorse constipation.  She is currently on her menstrual cycle but denies heavier bleeding, longer menstrual cycle or starting later than usual.  She recently had a normal vaginal birth 7 months ago and has had no complications.  She was seen at her OB/GYN on Thursday where she had a normal pelvic exam as well as normal pelvic ultrasound.  She has been taking ibuprofen without relief.  She denies any alleviating factors.  Patient has no abdominal history.  She denies headaches, fevers, chills, shortness breath, chest pain, nausea, vomiting, diarrhea, pedal edema.  Past Medical History:  Diagnosis Date  . Anxiety   . Blood in urine   . History of fainting   . Insomnia   . Low back pain   . Macular eruption   . Panic attacks   . 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     Patient Active Problem List   Diagnosis Date Noted  . Pregnancy 04/07/2020  . Pregnant and not yet delivered in third trimester 04/06/2020  . Vaginal bleeding in pregnancy, third trimester 02/19/2020  . Nonallopathic lesion of cervical region 01/23/2018  . Nonallopathic lesion of thoracic region 01/23/2018  . Nonallopathic lesion of rib cage 01/23/2018  . Cervicogenic headache 01/16/2018  . Sleep deprivation seizure (HCC)  01/15/2017  . SVD (spontaneous vaginal delivery) (7/16) 06/24/2016  . Postpartum care following vaginal delivery (7/16) 06/24/2016  . Postpartum state 06/23/2016  . Preterm premature rupture of membranes 06/21/2016    Past Surgical History:  Procedure Laterality Date  . COLONOSCOPY    . MOLE REMOVAL    . MOUTH SURGERY    . NASAL ENDOSCOPY    . ROTATOR CUFF REPAIR    . SHOULDER SURGERY Right   . TONSILLECTOMY    . WISDOM TOOTH EXTRACTION       OB History    Gravida  2   Para  2   Term  2   Preterm      AB  0   Living  2     SAB      IAB      Ectopic  0   Multiple  0   Live Births  2           Family History  Problem Relation Age of Onset  . Hypertension Mother   . ADD / ADHD Father   . COPD Paternal Grandmother   . Cancer Paternal Grandfather     Social History   Tobacco Use  . Smoking status: Never Smoker  . Smokeless tobacco: Never Used  Vaping Use  . Vaping Use: Never used  Substance Use Topics  . Alcohol use: No  . Drug use: No    Home Medications Prior to Admission medications   Medication Sig Start Date End Date  Taking? Authorizing Provider  calcium carbonate (TUMS - DOSED IN MG ELEMENTAL CALCIUM) 500 MG chewable tablet Chew 1-2 tablets by mouth 2 (two) times daily as needed for indigestion or heartburn.    [provider]  cephALEXin (KEFLEX) 500 MG capsule Take 1 capsule (500 mg total) by mouth 2 (two) times daily for 5 days. 11/19/20 11/24/20  Carroll Sage, PA-C  Docosahexaenoic Acid (DHA OMEGA 3 PO) Take 1 capsule by mouth 2 (two) times daily.    [provider]  Multiple Vitamins-Minerals (MULTIVITAMIN WITH MINERALS) tablet Take 1 tablet by mouth daily.    [provider]  sertraline (ZOLOFT) 25 MG tablet Take 1-2 tablets (25-50 mg total) by mouth daily. Take 1 tab alternating with 2 every other day for 14 days. After that period  take 50 mg daily. 08/14/17   Dohmeier, Porfirio Mylar, MD    Allergies     Sulfa antibiotics, Trazodone and nefazodone, and Prednisone  Review of Systems   Review of Systems  Constitutional: Negative for chills and fever.  HENT: Negative for congestion.   Respiratory: Negative for shortness of breath.   Cardiovascular: Negative for chest pain.  Gastrointestinal: Positive for abdominal pain and constipation. Negative for diarrhea, nausea and vomiting.  Genitourinary: Positive for hematuria, pelvic pain and vaginal bleeding. Negative for dyspareunia, dysuria, enuresis and vaginal discharge.  Musculoskeletal: Negative for back pain.  Skin: Negative for rash.  Neurological: Negative for dizziness and headaches.  Hematological: Does not bruise/bleed easily.    Physical Exam Updated Vital Signs BP 122/85 (BP Location: Right Arm)   Pulse 68   Temp 99.5 F (37.5 C) (Oral)   Resp 18   Ht 5\' 2"  (1.575 m)   Wt 68.5 kg   SpO2 100%   BMI 27.62 kg/m   Physical Exam Vitals and nursing note reviewed.  Constitutional:      General: She is not in acute distress.    Appearance: She is not ill-appearing.  HENT:     Head: Normocephalic and atraumatic.     Nose: No congestion.  Eyes:     Conjunctiva/sclera: Conjunctivae normal.  Cardiovascular:     Rate and Rhythm: Normal rate and regular rhythm.     Pulses: Normal pulses.     Heart sounds: No murmur heard. No friction rub. No gallop.   Pulmonary:     Effort: No respiratory distress.     Breath sounds: No wheezing, rhonchi or rales.  Abdominal:     Palpations: Abdomen is soft.     Tenderness: There is abdominal tenderness.     Comments: Abdomen was visualized it was nondistended, normoactive bowel sounds, dull to percussion.  She had slight tenderness to palpation in her left lower quadrant, no rebound tenderness, no peritoneal sign, negative McBurney point.  Negative CVA tenderness  Musculoskeletal:     Right lower leg: No edema.     Left lower leg: No edema.  Skin:    General: Skin is warm and dry.   Neurological:     Mental Status: She is alert.  Psychiatric:        Mood and Affect: Mood normal.     ED Results / Procedures / Treatments   Labs (all labs ordered are listed, but only abnormal results are displayed) Labs Reviewed  URINALYSIS, ROUTINE W REFLEX MICROSCOPIC - Abnormal; Notable for the following components:      Result Value   APPearance CLOUDY (*)    Hgb urine dipstick LARGE (*)  All other components within normal limits  URINALYSIS, MICROSCOPIC (REFLEX) - Abnormal; Notable for the following components:   Bacteria, UA FEW (*)    All other components within normal limits  URINE CULTURE  PREGNANCY, URINE  BASIC METABOLIC PANEL  CBC WITH DIFFERENTIAL/PLATELET    EKG None  Radiology CT Renal Stone Study  Result Date: 11/19/2020 CLINICAL DATA:  Lower abdominal pain for 6 days.  Gross hematuria. EXAM: CT ABDOMEN AND PELVIS WITHOUT CONTRAST TECHNIQUE: Multidetector CT imaging of the abdomen and pelvis was performed following the standard protocol without IV contrast. COMPARISON:  CT abdomen pelvis 11/13/2018 FINDINGS: Lower chest: No acute abnormality. Evaluation of the abdominal viscera somewhat limited by the lack of IV contrast. The is Hepatobiliary: No focal liver abnormality is seen. Gallbladder is normal appearance. Pancreas: Unremarkable.  No surrounding inflammatory changes. Spleen: Normal in size without focal abnormality. Adrenals/Urinary Tract: Adrenal glands are unremarkable. There is a 0.3 cm calculus in the superior right kidney. No hydronephrosis. No left renal calculi or hydronephrosis. Urinary bladder is unremarkable. Stomach/Bowel: Stomach is within normal limits. Appendix appears normal. No evidence of bowel wall thickening, distention, or inflammatory changes. Stool throughout the colon. Vascular/Lymphatic: No significant vascular findings are present. No enlarged abdominal or pelvic lymph nodes. Reproductive: Uterus and bilateral adnexa are  unremarkable. Other: No abdominal wall hernia or abnormality. No abdominopelvic ascites. Musculoskeletal: No acute or significant osseous findings. IMPRESSION: 1. There 3 mm nonobstructing calculus in the superior right kidney. No hydronephrosis. 2. Heavy stool burden. Electronically Signed   By: Emmaline Kluver M.D.   On: 11/19/2020 16:11    Procedures Procedures (including critical care time)  Medications Ordered in ED Medications  ketorolac (TORADOL) 30 MG/ML injection 30 mg (30 mg Intravenous Given 11/19/20 1722)    ED Course  I have reviewed the triage vital signs and the nursing notes.  Pertinent labs & imaging results that were available during my care of the patient were reviewed by me and considered in my medical decision making (see chart for details).    MDM Rules/Calculators/A&P                          Patient presents with left lower quadrant pain.  She is alert, does not appear acute distress, vital signs reassuring.  Will obtain CT renal as she has UA showing many red blood cells concern for possible kidney stone versus acute cystitis.  Patient was reassessed after obtaining lab work and imaging, patient states pain has remained unchanged.  States his manageable at this time.  Tolerating p.o. without difficulty.  CBC negative for leukocytosis or signs of anemia.  BMP negative for electrolyte abnormalities, no metabolic acidosis, no AKI, no anion gap present.  UA negative for nitrates, leukocytes, shows many red blood cells, few bacteria.  Due to abnormal UA will obtain urine culture for further evaluation.  Urine pregnancy is negative.  CT renal shows 3 mm nonobstructing calculus in the superior right kidney no hydronephrosis noted, heavy stool burden present.  Low suspicion for ectopic pregnancy as urine pregnancy is negative.  Low suspicion for ovarian torsion as she has no history of torsion, there is no adnexal mass noted on CT scan, she had a negative pelvic  ultrasound on Thursday.  Low suspicion for intra-abdominal abnormality requiring immediate intervention as CT abdomen does not reveal any acute findings, no peritoneal sign noted on my exam.  Low suspicion for pyelonephritis patient denies urinary symptoms, no CVA  tenderness on my exam.  Patient does have noted hematuria on her UA possible this may be cystitis will culture urine and start her on antibiotics.  Patient has left lower quadrant pain with unknown etiology differential diagnosis includes acute cystitis, abdominal strain, constipation.  Will start patient on antibiotics for possible acute cystitis, encourage MiraLAX use for constipation and follow-up with urology and GI for further evaluation.  Vital signs have remained stable, no indication for hospital admission.  Patient discussed with attending and they agreed with assessment and plan.  Patient given at home care as well strict return precautions.  Patient verbalized that they understood agreed to said plan.  Final Clinical Impression(s) / ED Diagnoses Final diagnoses:  Left upper quadrant abdominal pain    Rx / DC Orders ED Discharge Orders         Ordered    cephALEXin (KEFLEX) 500 MG capsule  2 times daily        11/19/20 1706           Barnie Del 11/19/20 1817    Vanetta Mulders, MD 11/24/20 [phone]

## 2020-11-19 NOTE — Discharge Instructions (Signed)
Presented with left lower quadrant pain.  Lab work and imaging all looks reassuring.  UA does shows red blood cells with rare bacteria will start you on antibiotics please take as prescribed.  I also recommend continue with your MiraLAX, I would take 2 capfuls a day for the next 5 days.  Please drink plenty of fluids as you will need to stay hydrated for this to work.  Given the contact information for a nephrologist as well as a GI doctor recommend following up with them for further evaluation.  Come back to the emergency department if you develop chest pain, shortness of breath, severe abdominal pain, uncontrolled nausea, vomiting, diarrhea.

## 2020-11-20 LAB — URINE CULTURE: Culture: NO GROWTH

## 2021-10-06 ENCOUNTER — Inpatient Hospital Stay (HOSPITAL_COMMUNITY)
Admission: AD | Admit: 2021-10-06 | Discharge: 2021-10-06 | Disposition: A | Discharge status: Home / Self Care | Payer: BC Managed Care – PPO | Attending: Obstetrics and Gynecology | Admitting: Obstetrics and Gynecology

## 2021-10-06 ENCOUNTER — Inpatient Hospital Stay (HOSPITAL_COMMUNITY): Payer: BC Managed Care – PPO

## 2021-10-06 ENCOUNTER — Encounter (HOSPITAL_COMMUNITY): Payer: Self-pay | Admitting: Obstetrics and Gynecology

## 2021-10-06 DIAGNOSIS — Z6711 Type A blood, Rh negative: Secondary | ICD-10-CM

## 2021-10-06 DIAGNOSIS — O209 Hemorrhage in early pregnancy, unspecified: Secondary | ICD-10-CM | POA: Diagnosis present

## 2021-10-06 DIAGNOSIS — O200 Threatened abortion: Secondary | ICD-10-CM | POA: Diagnosis not present

## 2021-10-06 DIAGNOSIS — Z3A01 Less than 8 weeks gestation of pregnancy: Secondary | ICD-10-CM | POA: Diagnosis not present

## 2021-10-06 LAB — COMPREHENSIVE METABOLIC PANEL
ALT: 29 U/L (ref 0–44)
AST: 28 U/L (ref 15–41)
Albumin: 3.9 g/dL (ref 3.5–5.0)
Alkaline Phosphatase: 37 U/L — ABNORMAL LOW (ref 38–126)
Anion gap: 8 (ref 5–15)
BUN: 11 mg/dL (ref 6–20)
CO2: 24 mmol/L (ref 22–32)
Calcium: 9.3 mg/dL (ref 8.9–10.3)
Chloride: 103 mmol/L (ref 98–111)
Creatinine, Ser: 0.74 mg/dL (ref 0.44–1.00)
GFR, Estimated: >60 mL/min (ref >60)
Glucose, Bld: 93 mg/dL (ref 70–99)
Potassium: 4.2 mmol/L (ref 3.5–5.1)
Sodium: 135 mmol/L (ref 135–145)
Total Bilirubin: 0.8 mg/dL (ref 0.3–1.2)
Total Protein: 6.8 g/dL (ref 6.5–8.1)

## 2021-10-06 LAB — POCT PREGNANCY, URINE: Preg Test, Ur: POSITIVE — AB

## 2021-10-06 LAB — CBC WITH DIFFERENTIAL/PLATELET
Abs Immature Granulocytes: 0.01 10*3/uL (ref 0.00–0.07)
Basophils Absolute: 0 10*3/uL (ref 0.0–0.1)
Basophils Relative: 1 %
Eosinophils Absolute: 0.1 10*3/uL (ref 0.0–0.5)
Eosinophils Relative: 2 %
HCT: 39.9 % (ref 36.0–46.0)
Hemoglobin: 13.3 g/dL (ref 12.0–15.0)
Immature Granulocytes: 0 %
Lymphocytes Relative: 27 %
Lymphs Abs: 1.6 10*3/uL (ref 0.7–4.0)
MCH: 29.8 pg (ref 26.0–34.0)
MCHC: 33.3 g/dL (ref 30.0–36.0)
MCV: 89.3 fL (ref 80.0–100.0)
Monocytes Absolute: 0.5 10*3/uL (ref 0.1–1.0)
Monocytes Relative: 8 %
Neutro Abs: 3.6 10*3/uL (ref 1.7–7.7)
Neutrophils Relative %: 62 %
Platelets: 197 10*3/uL (ref 150–400)
RBC: 4.47 MIL/uL (ref 3.87–5.11)
RDW: 12.6 % (ref 11.5–15.5)
WBC: 5.8 10*3/uL (ref 4.0–10.5)
nRBC: 0 % (ref 0.0–0.2)

## 2021-10-06 LAB — ABO/RH
ABO/RH(D): A NEG
Antibody Screen: NEGATIVE

## 2021-10-06 LAB — HCG, QUANTITATIVE, PREGNANCY: hCG, Beta Chain, Quant, S: 14 m[IU]/mL — ABNORMAL HIGH (ref <5)

## 2021-10-06 MED ORDER — RHO D IMMUNE GLOBULIN 1500 UNIT/2ML IJ SOSY
300.0000 ug | PREFILLED_SYRINGE | Freq: Once | INTRAMUSCULAR | Status: AC
  Administered 2021-10-06: 300 ug via INTRAMUSCULAR
  Filled 2021-10-06: qty 2

## 2021-10-06 NOTE — MAU Provider Note (Signed)
History     CSN: [id number]  Arrival date and time: 10/06/21 1309   Event Date/Time   First Provider Initiated Contact with Patient 10/06/21 1414      Chief Complaint  Patient presents with   Possible Pregnancy   Vaginal Bleeding   HPI  Julia Hansen is a 35 y.o. female [phone] @ [redacted]w[redacted]d here in MAU with vaginal bleeding that started yesterday. She had a positive pregnancy test last week. The bleeding is similar to a period, with small clots.   OB History     Gravida  3   Para  2   Term  2   Preterm      AB  0   Living  2      SAB      IAB      Ectopic  0   Multiple  0   Live Births  2           Past Medical History:  Diagnosis Date   Anxiety    Blood in urine    History of fainting    Insomnia    Low back pain    Macular eruption    Panic attacks    Seizures (HCC)    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     Past Surgical History:  Procedure Laterality Date   COLONOSCOPY     MOLE REMOVAL     MOUTH SURGERY     NASAL ENDOSCOPY     ROTATOR CUFF REPAIR     SHOULDER SURGERY Right    TONSILLECTOMY     WISDOM TOOTH EXTRACTION      Family History  Problem Relation Age of Onset   Hypertension Mother    ADD / ADHD Father    COPD Paternal Grandmother    Cancer Paternal Grandfather     Social History   Tobacco Use   Smoking status: Never   Smokeless tobacco: Never  Vaping Use   Vaping Use: Never used  Substance Use Topics   Alcohol use: No   Drug use: No    Allergies:  Allergies  Allergen Reactions   Sulfa Antibiotics    Trazodone And Nefazodone     Patient had immediate dizziness and shortness of breath and "fell on the floor".  Husband had to call EMS not sure if it is a panic attack vs. Possible medication reaction.     Prednisone Anxiety and Other (See Comments)    Patient states it "makes her psycho."    Medications Prior to Admission  Medication Sig Dispense Refill Last Dose   sertraline  (ZOLOFT) 25 MG tablet Take 1-2 tablets (25-50 mg total) by mouth daily. Take 1 tab alternating with 2 every other day for 14 days. After that period  take 50 mg daily. 60 tablet 5 10/05/2021   calcium carbonate (TUMS - DOSED IN MG ELEMENTAL CALCIUM) 500 MG chewable tablet Chew 1-2 tablets by mouth 2 (two) times daily as needed for indigestion or heartburn.      Docosahexaenoic Acid (DHA OMEGA 3 PO) Take 1 capsule by mouth 2 (two) times daily.      Multiple Vitamins-Minerals (MULTIVITAMIN WITH MINERALS) tablet Take 1 tablet by mouth daily.      Results for orders placed or performed during the hospital encounter of 10/06/21 (from the past 48 hour(s))  Pregnancy, urine POC     Status: Abnormal   Collection Time: 10/06/21  1:33 PM  Result Value Ref Range  Preg Test, Ur POSITIVE (A) NEGATIVE    Comment:        THE SENSITIVITY OF THIS METHODOLOGY IS >24 mIU/mL   CBC with Differential/Platelet     Status: None   Collection Time: 10/06/21  2:06 PM  Result Value Ref Range   WBC 5.8 4.0 - 10.5 K/uL   RBC 4.47 3.87 - 5.11 MIL/uL   Hemoglobin 13.3 12.0 - 15.0 g/dL   HCT 16.1 09.6 - 04.5 %   MCV 89.3 80.0 - 100.0 fL   MCH 29.8 26.0 - 34.0 pg   MCHC 33.3 30.0 - 36.0 g/dL   RDW 40.9 81.1 - 91.4 %   Platelets 197 150 - 400 K/uL   nRBC 0.0 0.0 - 0.2 %   Neutrophils Relative % 62 %   Neutro Abs 3.6 1.7 - 7.7 K/uL   Lymphocytes Relative 27 %   Lymphs Abs 1.6 0.7 - 4.0 K/uL   Monocytes Relative 8 %   Monocytes Absolute 0.5 0.1 - 1.0 K/uL   Eosinophils Relative 2 %   Eosinophils Absolute 0.1 0.0 - 0.5 K/uL   Basophils Relative 1 %   Basophils Absolute 0.0 0.0 - 0.1 K/uL   Immature Granulocytes 0 %   Abs Immature Granulocytes 0.01 0.00 - 0.07 K/uL    Comment: Performed at [facility], [address], [place], [place] [address]  Comprehensive metabolic panel     Status: Abnormal   Collection Time: 10/06/21  2:06 PM  Result Value Ref Range   Sodium 135 135 - 145 mmol/L   Potassium 4.2  3.5 - 5.1 mmol/L   Chloride 103 98 - 111 mmol/L   CO2 24 22 - 32 mmol/L   Glucose, Bld 93 70 - 99 mg/dL    Comment: Glucose reference range applies only to samples taken after fasting for at least 8 hours.   BUN 11 6 - 20 mg/dL   Creatinine, Ser 6.21 0.44 - 1.00 mg/dL   Calcium 9.3 8.9 - 30.8 mg/dL   Total Protein 6.8 6.5 - 8.1 g/dL   Albumin 3.9 3.5 - 5.0 g/dL   AST 28 15 - 41 U/L   ALT 29 0 - 44 U/L   Alkaline Phosphatase 37 (L) 38 - 126 U/L   Total Bilirubin 0.8 0.3 - 1.2 mg/dL   GFR, Estimated >65 >78 mL/min    Comment: (NOTE) Calculated using the CKD-EPI Creatinine Equation (2021)    Anion gap 8 5 - 15    Comment: Performed at [facility], [address], [place], [place] [address]  ABO/Rh     Status: None   Collection Time: 10/06/21  2:06 PM  Result Value Ref Range   ABO/RH(D) A NEG    Antibody Screen      NEG Performed at [facility], [address] [place], [place] [address]   hCG, quantitative, pregnancy     Status: Abnormal   Collection Time: 10/06/21  2:06 PM  Result Value Ref Range   hCG, Beta Chain, Quant, S 14 (H) <5 mIU/mL    Comment:          GEST. AGE      CONC.  (mIU/mL)   <=1 WEEK        5 - 50     2 WEEKS       50 - 500     3 WEEKS       100 - 10,000  4 WEEKS     1,000 - 30,000     5 WEEKS     3,500 - 115,000   6-8 WEEKS     12,000 - 270,000    12 WEEKS     15,000 - 220,000        FEMALE AND NON-PREGNANT FEMALE:     LESS THAN 5 mIU/mL Performed at [facility], [address], [place], [place] [address]   Rh IG workup (includes ABO/Rh)     Status: None (Preliminary result)   Collection Time: 10/06/21  2:45 PM  Result Value Ref Range   Gestational Age(Wks) 4    Unit Number [id number]    Blood Component Type RHIG    Unit division 00    Status of Unit ISSUED    Transfusion Status      OK TO TRANSFUSE Performed at [facility], [address], [place], [place] [address]     US OB LESS THAN 14 WEEKS WITH [place]  TRANSVAGINAL  Result Date: 10/06/2021 CLINICAL DATA:  Vaginal bleeding. EXAM: OBSTETRIC <14 WK [place] AND TRANSVAGINAL OB US TECHNIQUE: Both transabdominal and transvaginal ultrasound examinations were performed for complete evaluation of the gestation as well as the maternal uterus, adnexal regions, and pelvic cul-de-sac. Transvaginal technique was performed to assess early pregnancy. COMPARISON:  February 20, 2020 FINDINGS: Intrauterine gestational sac: None Yolk sac:  Not Visualized. Embryo:  Not Visualized. Cardiac Activity: Not Visualized. Heart Rate: N/A  bpm Subchorionic hemorrhage:  None visualized. Maternal uterus/adnexae: The endometrium measures 7.8 mm and thickness and is normal in appearance. The right ovary measures 1.5 cm x 2.9 cm x 1.9 cm and is normal in appearance. The left ovary measures 1.6 cm x 2.1 cm x 0.8 cm and is normal in appearance. No pelvic free fluid is seen. IMPRESSION: No evidence of an intrauterine pregnancy. Electronically Signed   By: Aram Candela M.D.   On: 10/06/2021 15:12     Review of Systems  Constitutional:  Negative for fever.  Gastrointestinal:  Negative for abdominal pain.  Genitourinary:  Positive for vaginal bleeding.  Physical Exam   Blood pressure 123/78, pulse 74, temperature 98 F (36.7 C), resp. rate 16, last menstrual period 09/02/2021, SpO2 100 %, unknown if currently breastfeeding.  Physical Exam Vitals and nursing note reviewed.  Constitutional:      General: She is not in acute distress.    Appearance: Normal appearance. She is not ill-appearing, toxic-appearing or diaphoretic.  Abdominal:     Palpations: Abdomen is soft.     Tenderness: There is no abdominal tenderness.  Genitourinary:    Comments: Cervix: FT, thick, Anterior.  Small amount of dark red blood noted.  Neurological:     Mental Status: She is alert.    MAU Course  Procedures None  MDM  Wet prep & GC HIV, CBC, Hcg, ABO US OB transvaginal   Rhogam given    Assessment and Plan   A:  Threatened miscarriage - Plan: Discharge patient  Vaginal bleeding affecting early pregnancy - Plan: US OB LESS THAN 14 WEEKS WITH OB TRANSVAGINAL, US OB LESS THAN 14 WEEKS WITH OB TRANSVAGINAL, Discharge patient  Type A blood, Rh negative - Plan: Discharge patient   P:  Discharge home in stable condition Spoke to Dr. Rana Snare about f/u next week Return to MAU if symptoms worsen Bleeding precautions  Makelle Marrone, Harolyn Rutherford, NP 10/06/2021 4:37 PM

## 2021-10-06 NOTE — MAU Note (Signed)
Pt reports to mau with c/o vag bleeding that started 2 days ago after having several pos hpt a week ago.  Denies pain.

## 2021-10-07 LAB — RH IG WORKUP (INCLUDES ABO/RH)
Gestational Age(Wks): 4
Unit division: 0

## 2021-10-30 ENCOUNTER — Emergency Department (HOSPITAL_BASED_OUTPATIENT_CLINIC_OR_DEPARTMENT_OTHER)
Admission: EM | Admit: 2021-10-30 | Discharge: 2021-10-30 | Disposition: A | Discharge status: Home / Self Care | Payer: BC Managed Care – PPO | Attending: Emergency Medicine | Admitting: Emergency Medicine

## 2021-10-30 ENCOUNTER — Emergency Department (HOSPITAL_BASED_OUTPATIENT_CLINIC_OR_DEPARTMENT_OTHER): Payer: BC Managed Care – PPO

## 2021-10-30 ENCOUNTER — Encounter (HOSPITAL_BASED_OUTPATIENT_CLINIC_OR_DEPARTMENT_OTHER): Payer: Self-pay | Admitting: *Deleted

## 2021-10-30 ENCOUNTER — Other Ambulatory Visit: Payer: Self-pay

## 2021-10-30 DIAGNOSIS — W228XXA Striking against or struck by other objects, initial encounter: Secondary | ICD-10-CM | POA: Insufficient documentation

## 2021-10-30 DIAGNOSIS — S060X0A Concussion without loss of consciousness, initial encounter: Secondary | ICD-10-CM | POA: Diagnosis not present

## 2021-10-30 DIAGNOSIS — S0990XA Unspecified injury of head, initial encounter: Secondary | ICD-10-CM

## 2021-10-30 NOTE — ED Triage Notes (Signed)
C/o head injury x 5 days ago , seen by UC , closed lac with dermbond. Cont h/a behind left eye

## 2021-10-30 NOTE — Discharge Instructions (Addendum)
You have been diagnosed with a concussion.  °Ibuprofen or Tylenol for pain °Rest, ice on head.  Stay in a quiet, non-simulating, dark environment. No TV, computer use, video games until headache is resolved completely.  °Follow up with your primary care physician if headache persists.  Return to the emergency department if patient becomes lethargic, begins vomiting or other change in mental status. ° °HEAD INJURY °If any of the following occur notify your physician or go to °the Hospital Emergency Department: ° Increased drowsiness, stupor or loss of consciousness ° Temperature above 100º F ° Vomiting ° Severe headache ° Blood or clear fluid dripping from the nose or ears ° Stiffness of the neck ° Dizziness or blurred vision ° Any other unusual symptoms ° °PRECAUTIONS ° Keep head elevated at all times for the first 24 hours °(Elevate mattress if pillow is ineffective) ° Do not take sedatives, narcotics or alcohol ° Avoid aspirin. Use only acetaminophen (e.g. Tylenol) or °ibuprofen (e.g. Advil) for relief of pain. Follow directions °on the bottle for dosage. ° Use ice packs for comfort ° °

## 2021-10-30 NOTE — ED Provider Notes (Signed)
[facility] EMERGENCY DEPARTMENT Provider Note   CSN: [id number] Arrival date & time: 10/30/21  1511     History Chief Complaint  Patient presents with   Head Injury    Julia Hansen is a 35 y.o. female.  HPI 35 year old female presents to the emergency department today for evaluation of head injury.  Patient reports that about 5 days ago she was loading something into her car and she sustained a head injury with the car door.  Patient reports that she has had a persistent headache since then.  She was seen in urgent care that day and had her laceration of her forehead repaired.  Patient reports that she has had difficulties with focusing and has had photophobia.  Patient denies any nausea or vomiting.  No vision changes.  Has been taken Tylenol and Motrin for headache.    Past Medical History:  Diagnosis Date   Anxiety    Blood in urine    History of fainting    Insomnia    Low back pain    Macular eruption    Panic attacks    Seizures (HCC)    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   SVD (spontaneous vaginal delivery) (7/16) 06/24/2016   Postpartum care following vaginal delivery (7/16) 06/24/2016   Postpartum state 06/23/2016   Preterm premature rupture of membranes 06/21/2016    Past Surgical History:  Procedure Laterality Date   COLONOSCOPY     MOLE REMOVAL     MOUTH SURGERY     NASAL ENDOSCOPY     ROTATOR CUFF REPAIR     SHOULDER SURGERY Right    TONSILLECTOMY     WISDOM TOOTH EXTRACTION        OB History     Gravida  3   Para  2   Term  2   Preterm      AB  0   Living  2      SAB      IAB      Ectopic  0   Multiple  0   Live Births  2           Family History  Problem Relation Age of Onset   Hypertension Mother    ADD / ADHD Father    COPD Paternal Grandmother    Cancer Paternal Grandfather     Social History   Tobacco Use   Smoking status: Never   Smokeless tobacco: Never  Vaping Use   Vaping Use: Never used  Substance Use Topics   Alcohol use: No   Drug use: No    Home Medications Prior to Admission medications   Medication Sig Start Date End Date Taking? Authorizing Provider  calcium carbonate (TUMS - DOSED IN MG ELEMENTAL CALCIUM) 500 MG chewable tablet Chew 1-2 tablets by mouth 2 (two) times daily as needed for indigestion or heartburn.    [provider]  Docosahexaenoic Acid (DHA OMEGA 3 PO) Take 1 capsule by mouth 2 (two) times daily.    [provider]  Multiple Vitamins-Minerals (MULTIVITAMIN WITH MINERALS) tablet Take 1 tablet by mouth daily.    [provider]  sertraline (ZOLOFT) 25 MG tablet Take 1-2 tablets (25-50 mg total) by mouth daily. Take 1 tab alternating with 2 every other day for 14 days. After that period  take 50 mg daily. 08/14/17   Dohmeier, Porfirio Mylar, MD    Allergies    Sulfa antibiotics, Trazodone and nefazodone, and Prednisone  Review of Systems   Review of Systems  Constitutional:  Negative for chills and fever.  HENT:  Negative for congestion.   Eyes:  Positive for photophobia. Negative for visual disturbance.  Gastrointestinal:  Negative for nausea and vomiting.  Musculoskeletal:  Negative for neck pain.  Skin:  Negative for color change.  Neurological:  Positive for headaches. Negative for dizziness.  Psychiatric/Behavioral:  Negative for confusion.    Physical Exam Updated Vital Signs BP (!) 128/99 (BP Location: Right Arm)   Pulse 87   Temp 99.1 F (37.3 C) (Oral)    Resp 18   Ht 5' 1.5" (1.562 m)   Wt 73.5 kg   LMP 09/27/2021   SpO2 100%   Breastfeeding Unknown   BMI 30.11 kg/m   Physical Exam Vitals and nursing note reviewed.  Constitutional:      General: She is not in acute distress.    Appearance: She is well-developed. She is not ill-appearing or toxic-appearing.  HENT:     Head: Normocephalic and atraumatic.     Comments: No bilateral hemotympanum.  No septal hematoma.  No battle signs or raccoon eyes. Eyes:     General: No scleral icterus.       Right eye: No discharge.        Left eye: No discharge.  Pulmonary:     Effort: No respiratory distress.  Musculoskeletal:        General: Normal range of motion.     Cervical back: Normal range of motion.  Skin:    General: Skin is warm and dry.     Capillary Refill: Capillary refill takes less than 2 seconds.     Coloration: Skin is not pale.  Neurological:     Mental Status: She is alert and oriented to person, place, and time.     Comments: The patient is alert, attentive, and oriented x 3. Speech is clear. Cranial nerve II-VII grossly intact. Negative pronator drift. Sensation intact. Strength 5/5 in all extremities. Reflexes 2+ and symmetric at biceps, triceps, knees, and ankles. Rapid alternating movement and fine finger movements intact.    Psychiatric:        Mood and Affect: Mood normal.        Behavior: Behavior normal.        Thought Content: Thought content normal.        Judgment: Judgment normal.    ED Results / Procedures / Treatments   Labs (all labs ordered are listed, but only abnormal results are displayed) Labs Reviewed - No data to display  EKG None  Radiology No results found.  Procedures Procedures   Medications Ordered in ED Medications - No data to display  ED Course  I have reviewed the triage vital signs and the nursing notes.  Pertinent labs & imaging results that were available during my care of the patient were reviewed by me and  considered in my medical decision making (see chart for details).    MDM Rules/Calculators/A&P  Patient symptoms consistent with concussion. No vomiting. No focal neurological deficits on physical exam.  Ct imaging ordered at patient request that is reassuring. Discussed symptoms of post concussive syndrome and reasons to return to the emergency department including any new  severe headaches, disequilibrium, vomiting, double vision, extremity weakness, difficulty ambulating, or any other concerning symptoms. Patient will be discharged with information pertaining to diagnosis.  Pt is safe for discharge at this time.  Final Clinical Impression(s) / ED Diagnoses Final diagnoses:  Injury of head, initial encounter  Concussion without loss of consciousness, initial encounter    Rx / DC Orders ED Discharge Orders     None        Wallace Keller 10/30/21 1650    Tegeler, Canary Brim, MD 10/30/21 2155

## 2021-12-09 NOTE — L&D Delivery Note (Signed)
Delivery Note  SVD viable female Apgars 9,9 over intact perineum.  Placenta delivered spontaneously intact with 3VC. good support and hemostasis noted.  R/V exam confirms.   Mother and baby to couplet care and are doing well.  EBL 100cc  Candice Camp, MD

## 2021-12-11 ENCOUNTER — Encounter (HOSPITAL_BASED_OUTPATIENT_CLINIC_OR_DEPARTMENT_OTHER): Payer: Self-pay | Admitting: Emergency Medicine

## 2021-12-11 ENCOUNTER — Emergency Department (HOSPITAL_COMMUNITY): Admission: EM | Admit: 2021-12-11 | Payer: BC Managed Care – PPO | Source: Home / Self Care

## 2021-12-11 ENCOUNTER — Emergency Department (HOSPITAL_BASED_OUTPATIENT_CLINIC_OR_DEPARTMENT_OTHER)
Admission: EM | Admit: 2021-12-11 | Discharge: 2021-12-11 | Disposition: A | Discharge status: Home / Self Care | Payer: BC Managed Care – PPO | Attending: Emergency Medicine | Admitting: Emergency Medicine

## 2021-12-11 ENCOUNTER — Emergency Department (HOSPITAL_BASED_OUTPATIENT_CLINIC_OR_DEPARTMENT_OTHER): Payer: BC Managed Care – PPO

## 2021-12-11 DIAGNOSIS — O26891 Other specified pregnancy related conditions, first trimester: Secondary | ICD-10-CM | POA: Insufficient documentation

## 2021-12-11 DIAGNOSIS — Z3A01 Less than 8 weeks gestation of pregnancy: Secondary | ICD-10-CM | POA: Diagnosis not present

## 2021-12-11 DIAGNOSIS — R0789 Other chest pain: Secondary | ICD-10-CM | POA: Insufficient documentation

## 2021-12-11 LAB — TROPONIN I (HIGH SENSITIVITY): Troponin I (High Sensitivity): <2 ng/L (ref <18)

## 2021-12-11 LAB — CBC
HCT: 37.8 % (ref 36.0–46.0)
Hemoglobin: 13 g/dL (ref 12.0–15.0)
MCH: 29.8 pg (ref 26.0–34.0)
MCHC: 34.4 g/dL (ref 30.0–36.0)
MCV: 86.7 fL (ref 80.0–100.0)
Platelets: 202 10*3/uL (ref 150–400)
RBC: 4.36 MIL/uL (ref 3.87–5.11)
RDW: 13 % (ref 11.5–15.5)
WBC: 7 10*3/uL (ref 4.0–10.5)
nRBC: 0 % (ref 0.0–0.2)

## 2021-12-11 LAB — BASIC METABOLIC PANEL
Anion gap: 9 (ref 5–15)
BUN: 11 mg/dL (ref 6–20)
CO2: 22 mmol/L (ref 22–32)
Calcium: 9.4 mg/dL (ref 8.9–10.3)
Chloride: 104 mmol/L (ref 98–111)
Creatinine, Ser: 0.63 mg/dL (ref 0.44–1.00)
GFR, Estimated: >60 mL/min (ref >60)
Glucose, Bld: 108 mg/dL — ABNORMAL HIGH (ref 70–99)
Potassium: 3.6 mmol/L (ref 3.5–5.1)
Sodium: 135 mmol/L (ref 135–145)

## 2021-12-11 LAB — D-DIMER, QUANTITATIVE: D-Dimer, Quant: 0.33 ug/mL-FEU (ref 0.00–0.50)

## 2021-12-11 NOTE — ED Provider Notes (Signed)
[facility] EMERGENCY DEPARTMENT Provider Note   CSN: [id number] Arrival date & time: 12/11/21  1907     History  Chief Complaint  Patient presents with   Chest Pain    Julia Hansen is a 36 y.o. female.  The history is provided by the patient.  Chest Pain Pain location:  L chest Pain quality: shooting   Pain radiates to:  Does not radiate Pain severity:  Mild Onset quality:  Gradual Duration:  1 day Timing:  Intermittent Progression:  Waxing and waning Chronicity:  New Context: at rest   Relieved by:  Nothing Worsened by:  Nothing Associated symptoms: no abdominal pain, no back pain, no cough, no fever, no palpitations, no shortness of breath and no vomiting   Risk factors: pregnancy   Risk factors: no birth control, no coronary artery disease, no diabetes mellitus, no high cholesterol and no hypertension       Home Medications Prior to Admission medications   Medication Sig Start Date End Date Taking? Authorizing Provider  calcium carbonate (TUMS - DOSED IN MG ELEMENTAL CALCIUM) 500 MG chewable tablet Chew 1-2 tablets by mouth 2 (two) times daily as needed for indigestion or heartburn.    [provider]  Docosahexaenoic Acid (DHA OMEGA 3 PO) Take 1 capsule by mouth 2 (two) times daily.    [provider]  Multiple Vitamins-Minerals (MULTIVITAMIN WITH MINERALS) tablet Take 1 tablet by mouth daily.    [provider]  sertraline (ZOLOFT) 25 MG tablet Take 1-2 tablets (25-50 mg total) by mouth daily. Take 1 tab alternating with 2 every other day for 14 days. After that period  take 50 mg daily. 08/14/17   Dohmeier, Asencion Partridge, MD      Allergies    Sulfa antibiotics, Trazodone and nefazodone, and Prednisone    Review of Systems   Review of Systems  Constitutional:  Negative for chills and fever.  HENT:  Negative for ear pain and sore throat.   Eyes:  Negative for pain and visual disturbance.  Respiratory:  Negative for cough  and shortness of breath.   Cardiovascular:  Positive for chest pain. Negative for palpitations.  Gastrointestinal:  Negative for abdominal pain and vomiting.  Genitourinary:  Negative for dysuria and hematuria.  Musculoskeletal:  Negative for arthralgias and back pain.  Skin:  Negative for color change and rash.  Neurological:  Negative for seizures and syncope.  All other systems reviewed and are negative.  Physical Exam Updated Vital Signs BP 130/85 (BP Location: Right Arm)    Pulse 94    Temp 98.2 F (36.8 C) (Oral)    Resp 18    Ht 5' 1.5" (1.562 m)    Wt 72.1 kg    LMP 09/02/2021    SpO2 100%    BMI 29.56 kg/m  Physical Exam Vitals and nursing note reviewed.  Constitutional:      General: She is not in acute distress.    Appearance: She is well-developed. She is not ill-appearing.  HENT:     Head: Normocephalic and atraumatic.  Eyes:     Conjunctiva/sclera: Conjunctivae normal.     Pupils: Pupils are equal, round, and reactive to light.  Cardiovascular:     Rate and Rhythm: Normal rate and regular rhythm.     Pulses:          Radial pulses are 2+ on the right side and 2+ on the left side.     Heart sounds: Normal heart sounds.  No murmur heard. Pulmonary:     Effort: Pulmonary effort is normal. No respiratory distress.     Breath sounds: Normal breath sounds. No decreased breath sounds, wheezing or rhonchi.  Chest:     Chest wall: Tenderness present.  Abdominal:     Palpations: Abdomen is soft.     Tenderness: There is no abdominal tenderness.  Musculoskeletal:        General: No swelling.     Cervical back: Normal range of motion and neck supple.  Skin:    General: Skin is warm and dry.     Capillary Refill: Capillary refill takes less than 2 seconds.  Neurological:     General: No focal deficit present.     Mental Status: She is alert.  Psychiatric:        Mood and Affect: Mood normal.    ED Results / Procedures / Treatments   Labs (all labs ordered are  listed, but only abnormal results are displayed) Labs Reviewed  BASIC METABOLIC PANEL - Abnormal; Notable for the following components:      Result Value   Glucose, Bld 108 (*)    All other components within normal limits  CBC  D-DIMER, QUANTITATIVE  TROPONIN I (HIGH SENSITIVITY)    EKG EKG Interpretation  Date/Time:  Tuesday December 11 2021 19:19:39 EST Ventricular Rate:  87 PR Interval:  130 QRS Duration: 86 QT Interval:  348 QTC Calculation: 418 R Axis:   78 Text Interpretation: Normal sinus rhythm with sinus arrhythmia Nonspecific ST and T wave abnormality Abnormal ECG When compared with ECG of 18-Mar-2020 21:00, PREVIOUS ECG IS PRESENT Confirmed by Lennice Sites ([id number]) on 12/11/2021 7:23:48 PM  Radiology DG Chest 2 View  Result Date: 12/11/2021 CLINICAL DATA:  Chest pain. EXAM: CHEST - 2 VIEW COMPARISON:  CT of the chest 03/19/2020 FINDINGS: The heart size and mediastinal contours are within normal limits. Both lungs are clear. The visualized skeletal structures are unremarkable. IMPRESSION: No active cardiopulmonary disease. Electronically Signed   By: Ronney Asters M.D.   On: 12/11/2021 19:50    Procedures Procedures    Medications Ordered in ED Medications - No data to display  ED Course/ Medical Decision Making/ A&P                           Medical Decision Making  [place] is here with chest pain.  Heart score 1.  No significant cardiac history.  She is about 6 to [redacted] weeks pregnant.  Denies any abdominal pain, vaginal bleeding.  D-dimer normal doubt PE.  Chest x-ray negative for pneumonia.  No significant leukocytosis, anemia, electrolyte abnormality, kidney injury.  Lab work and EKG and imaging ordered and interpreted by me.  EKG shows sinus rhythm.  No ischemic changes.  Overall musculoskeletal type pain.  Recommend Tylenol.  Discharged in good condition.  Understands return precautions.  This chart was dictated using voice recognition software.   Despite best efforts to proofread,  errors can occur which can change the documentation meaning.         Final Clinical Impression(s) / ED Diagnoses Final diagnoses:  Atypical chest pain    Rx / DC Orders ED Discharge Orders     None         Lennice Sites, DO 12/11/21 2222

## 2021-12-11 NOTE — ED Triage Notes (Addendum)
Pt c/o sharp, shooting pain to LT chest today; she is approx 6-[redacted] wks pregnant; pt had Covid over Christmas

## 2022-01-02 LAB — OB RESULTS CONSOLE ABO/RH: RH Type: NEGATIVE

## 2022-01-02 LAB — OB RESULTS CONSOLE HIV ANTIBODY (ROUTINE TESTING): HIV: NONREACTIVE

## 2022-01-02 LAB — OB RESULTS CONSOLE RPR: RPR: NONREACTIVE

## 2022-01-02 LAB — OB RESULTS CONSOLE HEPATITIS B SURFACE ANTIGEN: Hepatitis B Surface Ag: NEGATIVE

## 2022-01-02 LAB — OB RESULTS CONSOLE ANTIBODY SCREEN: Antibody Screen: NEGATIVE

## 2022-01-02 LAB — OB RESULTS CONSOLE GC/CHLAMYDIA
Chlamydia: NEGATIVE
Neisseria Gonorrhea: NEGATIVE

## 2022-01-02 LAB — OB RESULTS CONSOLE RUBELLA ANTIBODY, IGM: Rubella: IMMUNE

## 2022-02-04 ENCOUNTER — Encounter (HOSPITAL_COMMUNITY): Payer: Self-pay | Admitting: Obstetrics and Gynecology

## 2022-02-04 ENCOUNTER — Other Ambulatory Visit: Payer: Self-pay

## 2022-02-04 ENCOUNTER — Inpatient Hospital Stay (HOSPITAL_COMMUNITY)
Admission: AD | Admit: 2022-02-04 | Discharge: 2022-02-04 | Disposition: A | Discharge status: Home / Self Care | Payer: BC Managed Care – PPO | Attending: Obstetrics and Gynecology | Admitting: Obstetrics and Gynecology

## 2022-02-04 DIAGNOSIS — K5900 Constipation, unspecified: Secondary | ICD-10-CM

## 2022-02-04 MED ORDER — SIMETHICONE 80 MG PO CHEW
80.0000 mg | CHEWABLE_TABLET | Freq: Once | ORAL | Status: AC
  Administered 2022-02-04: 80 mg via ORAL
  Filled 2022-02-04: qty 1

## 2022-02-04 MED ORDER — POLYETHYLENE GLYCOL 3350 17 G PO PACK: 17.0000 g | PACK | Freq: Every day | ORAL | 0 refills | Status: DC

## 2022-02-04 MED ORDER — DICYCLOMINE HCL 20 MG PO TABS
20.0000 mg | ORAL_TABLET | Freq: Once | ORAL | Status: AC
  Administered 2022-02-04: 20 mg via ORAL
  Filled 2022-02-04: qty 1

## 2022-02-04 MED ORDER — DOCUSATE SODIUM 100 MG PO CAPS: 100.0000 mg | ORAL_CAPSULE | Freq: Two times a day (BID) | ORAL | 2 refills | Status: DC | PRN

## 2022-02-04 MED ORDER — SIMETHICONE 80 MG PO CHEW: 80.0000 mg | CHEWABLE_TABLET | Freq: Four times a day (QID) | ORAL | 0 refills | Status: DC | PRN

## 2022-02-04 NOTE — Discharge Instructions (Signed)

## 2022-02-04 NOTE — MAU Provider Note (Signed)
History     CSN: [id number]  Arrival date and time: 02/04/22 1925   Event Date/Time   First Provider Initiated Contact with Patient 02/04/22 2009      Chief Complaint  Patient presents with   Abdominal Pain   HPI Julia Hansen is a 36 y.o. [id number] in first trimester who presents to MAU with chief complaint of generalized abdominal pain 2/2 recent constipation. Patient states her entire family was "ill with a GI bug" last week. She started to feel better but experienced new onset constipation, with no bowel movement since Friday 02/01/2022. She tried Miralax and an outpatient enema on the advice of her nurse line and had a small bowel movement earlier today. However, she immediately note significant bloating and her abdomen became hard and tender to palpation. On arrival to MAU pain score is 8/10, generalized to her abdomen. She has not taken medication for her pain. She endorses flatus. Diet recall today includes oatmeal, prune juice, humus, pita and olives. She denies all pregnancy-related complaints including vaginal bleeding, abnormal    OB History     Gravida  4   Para  2   Term  2   Preterm      AB  1   Living  2      SAB  1   IAB      Ectopic  0   Multiple  0   Live Births  2           Past Medical History:  Diagnosis Date   Anxiety    Blood in urine    History of fainting    Insomnia    Low back pain    Macular eruption    Panic attacks    Seizures (HCC)    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     Past Surgical History:  Procedure Laterality Date   COLONOSCOPY     MOLE REMOVAL     MOUTH SURGERY     NASAL ENDOSCOPY     ROTATOR CUFF REPAIR     SHOULDER SURGERY Right    TONSILLECTOMY     WISDOM TOOTH EXTRACTION      Family History  Problem Relation Age of Onset   Hypertension Mother    ADD / ADHD Father    COPD Paternal Grandmother    Cancer Paternal Grandfather     Social History   Tobacco Use   Smoking  status: Never   Smokeless tobacco: Never  Vaping Use   Vaping Use: Never used  Substance Use Topics   Alcohol use: Not Currently   Drug use: No    Allergies:  Allergies  Allergen Reactions   Sulfa Antibiotics    Trazodone And Nefazodone     Patient had immediate dizziness and shortness of breath and "fell on the floor".  Husband had to call EMS not sure if it is a panic attack vs. Possible medication reaction.     Prednisone Anxiety and Other (See Comments)    Patient states it "makes her psycho."    Medications Prior to Admission  Medication Sig Dispense Refill Last Dose   calcium carbonate (TUMS - DOSED IN MG ELEMENTAL CALCIUM) 500 MG chewable tablet Chew 1-2 tablets by mouth 2 (two) times daily as needed for indigestion or heartburn.   02/04/2022   Multiple Vitamins-Minerals (MULTIVITAMIN WITH MINERALS) tablet Take 1 tablet by mouth daily.   02/04/2022   Prenatal Vit-Fe Fumarate-FA (MULTIVITAMIN-PRENATAL) 27-0.8 MG TABS  tablet Take 1 tablet by mouth daily at 12 noon.      sertraline (ZOLOFT) 25 MG tablet Take 1-2 tablets (25-50 mg total) by mouth daily. Take 1 tab alternating with 2 every other day for 14 days. After that period  take 50 mg daily. 60 tablet 5 02/04/2022   Docosahexaenoic Acid (DHA OMEGA 3 PO) Take 1 capsule by mouth 2 (two) times daily.       Review of Systems  Gastrointestinal:  Positive for abdominal distention, abdominal pain and constipation.  All other systems reviewed and are negative. Physical Exam   Blood pressure 106/67, pulse 90, temperature 98.3 F (36.8 C), resp. rate 20, height 5\' 2"  (1.575 m), weight 74.4 kg, last menstrual period 10/29/2021, SpO2 100 %, unknown if currently breastfeeding.  Physical Exam Vitals and nursing note reviewed. Exam conducted with a chaperone present.  Constitutional:      General: She is not in acute distress.    Appearance: She is well-developed. She is not ill-appearing.  Cardiovascular:     Rate and Rhythm: Normal  rate and regular rhythm.     Heart sounds: Normal heart sounds.  Pulmonary:     Breath sounds: Normal breath sounds.  Abdominal:     General: Bowel sounds are normal.     Palpations: Abdomen is soft.     Tenderness: There is generalized abdominal tenderness.     Comments: Gravid   Neurological:     Mental Status: She is alert.    MAU Course  Procedures  MDM 2130: CNM returned to bedside. Patient resting comfortably in left lateral, on phone, reports improved pain, score now 3-4/10. Requests additional soap suds enema (2 of 2) prior to discharge. CNM reviewed dietary guidelines, medication regimen for minimizing episodes of constipation, selecting foods least likely to induce gas pain  Meds ordered this encounter  Medications   simethicone (MYLICON) chewable tablet 80 mg   dicyclomine (BENTYL) tablet 20 mg   polyethylene glycol (MIRALAX) 17 g packet    Sig: Take 17 g by mouth daily.    Dispense:  14 each    Refill:  0    Order Specific Question:   Supervising Provider    Answer:   [birth year]1989   docusate sodium (COLACE) 100 MG capsule    Sig: Take 1 capsule (100 mg total) by mouth 2 (two) times daily as needed.    Dispense:  30 capsule    Refill:  2    Order Specific Question:   Supervising Provider    Answer:   Fabio Bering Milas Hock   simethicone (GAS-X) 80 MG chewable tablet    Sig: Chew 1 tablet (80 mg total) by mouth every 6 (six) hours as needed for flatulence.    Dispense:  30 tablet    Refill:  0    Order Specific Question:   Supervising Provider    Answer:   [[id number]] Milas Hock   Assessment and Plan  --36 y.o. G4P2012 at [redacted] weeks gestation --FHT 161 by doppler --Abdominal pain 2/2 bloating, gas pain, constipation --S/p enema in MAU --Multiple bowel movements in MAU --Significant reduction in bloating, discomfort  --Discharge home in stable condition  07-02-2002, CNM

## 2022-02-04 NOTE — MAU Note (Signed)
Had stomach bug last wk with n/v/d. Better since Friday but no BM since Friday. Today tried Miralax and finally used fleets this afternoon. Had good BM. About after using fleets stomach became bloated and hard and has severe pain all over abdomen. Pain is sharp. No vag bleeding

## 2022-05-11 ENCOUNTER — Encounter (HOSPITAL_COMMUNITY): Payer: Self-pay | Admitting: Obstetrics & Gynecology

## 2022-05-11 ENCOUNTER — Inpatient Hospital Stay (HOSPITAL_COMMUNITY)
Admission: AD | Admit: 2022-05-11 | Discharge: 2022-05-12 | Disposition: A | Discharge status: Home / Self Care | Payer: BC Managed Care – PPO | Attending: Obstetrics & Gynecology | Admitting: Obstetrics & Gynecology

## 2022-05-11 DIAGNOSIS — R319 Hematuria, unspecified: Secondary | ICD-10-CM | POA: Insufficient documentation

## 2022-05-11 DIAGNOSIS — Z3A27 27 weeks gestation of pregnancy: Secondary | ICD-10-CM | POA: Insufficient documentation

## 2022-05-11 DIAGNOSIS — O26832 Pregnancy related renal disease, second trimester: Secondary | ICD-10-CM | POA: Insufficient documentation

## 2022-05-11 DIAGNOSIS — N200 Calculus of kidney: Secondary | ICD-10-CM | POA: Diagnosis present

## 2022-05-11 DIAGNOSIS — O09292 Supervision of pregnancy with other poor reproductive or obstetric history, second trimester: Secondary | ICD-10-CM | POA: Diagnosis not present

## 2022-05-11 DIAGNOSIS — R310 Gross hematuria: Secondary | ICD-10-CM | POA: Diagnosis not present

## 2022-05-11 DIAGNOSIS — Z3689 Encounter for other specified antenatal screening: Secondary | ICD-10-CM

## 2022-05-11 LAB — URINALYSIS, ROUTINE W REFLEX MICROSCOPIC
Bilirubin Urine: NEGATIVE
Glucose, UA: NEGATIVE mg/dL
Ketones, ur: NEGATIVE mg/dL
Nitrite: NEGATIVE
Protein, ur: 30 mg/dL — AB
Specific Gravity, Urine: 1.01 (ref 1.005–1.030)
pH: 7.5 (ref 5.0–8.0)

## 2022-05-11 LAB — URINALYSIS, MICROSCOPIC (REFLEX): RBC / HPF: >50 RBC/hpf (ref 0–5)

## 2022-05-11 NOTE — MAU Note (Signed)
.  Julia Hansen is a 36 y.o. at Unknown here in MAU reporting blood in her urine this evening. Had this with her last pregnancy. Also had placenta abruption with last pregnancy and just wants to be sure baby  is ok. Thinks blood is in her urine and not vaginal bleeding but wants to be sure. Good FM. No LOF. Having some tightness in lower abd. States never had an answer with last pregnancy as to why she had blood in her urine  Onset of complaint: tonight. Pain score: 3 Vitals:   05/11/22 2331  Pulse: (!) 105  Resp: 17  Temp: 98.5 F (36.9 C)  SpO2: 100%     FHT:147  U/a ordered

## 2022-05-12 ENCOUNTER — Inpatient Hospital Stay (HOSPITAL_BASED_OUTPATIENT_CLINIC_OR_DEPARTMENT_OTHER): Payer: BC Managed Care – PPO

## 2022-05-12 ENCOUNTER — Inpatient Hospital Stay (HOSPITAL_COMMUNITY): Payer: BC Managed Care – PPO

## 2022-05-12 DIAGNOSIS — O26893 Other specified pregnancy related conditions, third trimester: Secondary | ICD-10-CM

## 2022-05-12 DIAGNOSIS — R319 Hematuria, unspecified: Secondary | ICD-10-CM

## 2022-05-12 DIAGNOSIS — Z3A27 27 weeks gestation of pregnancy: Secondary | ICD-10-CM

## 2022-05-12 DIAGNOSIS — R310 Gross hematuria: Secondary | ICD-10-CM | POA: Diagnosis not present

## 2022-05-12 NOTE — MAU Provider Note (Signed)
History     CSN: [id number]  Arrival date and time: 05/11/22 2259   Event Date/Time   First Provider Initiated Contact with Patient 05/12/22 0100      Chief Complaint  Patient presents with   Hematuria   Julia Hansen is a 36 y.o. [id number] at [redacted]w[redacted]d who receives care at [facility].  She presents today for Hematuria.  She reports she noted bleeding in her urine.  She reports in her previous pregnancy she had similar symptoms and was "peeing chunks of blood" around 33 weeks. She states it is "chunks of red" and is fearful that "we never figured out what it was last time."  She reports passing small clots. She has seen a urologist, in the past, and was seen after delivery with no findings. She denies urinary retention.    OB History     Gravida  4   Para  2   Term  2   Preterm      AB  1   Living  2      SAB  1   IAB      Ectopic  0   Multiple  0   Live Births  2           Past Medical History:  Diagnosis Date   Anxiety    Blood in urine    History of fainting    Insomnia    Low back pain    Macular eruption    Panic attacks    Seizures (HCC)    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     Past Surgical History:  Procedure Laterality Date   COLONOSCOPY     MOLE REMOVAL     MOUTH SURGERY     NASAL ENDOSCOPY     ROTATOR CUFF REPAIR     SHOULDER SURGERY Right    TONSILLECTOMY     WISDOM TOOTH EXTRACTION      Family History  Problem Relation Age of Onset   Hypertension Mother    ADD / ADHD Father    COPD Paternal Grandmother    Cancer Paternal Grandfather     Social History   Tobacco Use   Smoking status: Never   Smokeless tobacco: Never  Vaping Use   Vaping Use: Never used  Substance Use Topics   Alcohol use: Not Currently   Drug use: No    Allergies:  Allergies  Allergen Reactions   Lactose Intolerance (Gi) Other (See Comments)    Bloating and costipation   Sulfa Antibiotics    Trazodone And  Nefazodone     Patient had immediate dizziness and shortness of breath and "fell on the floor".  Husband had to call EMS not sure if it is a panic attack vs. Possible medication reaction.     Prednisone Anxiety and Other (See Comments)    Patient states it "makes her psycho."    Medications Prior to Admission  Medication Sig Dispense Refill Last Dose   calcium carbonate (TUMS - DOSED IN MG ELEMENTAL CALCIUM) 500 MG chewable tablet Chew 1-2 tablets by mouth 2 (two) times daily as needed for indigestion or heartburn.   05/11/2022   Docosahexaenoic Acid (DHA OMEGA 3 PO) Take 1 capsule by mouth 2 (two) times daily.   05/11/2022   docusate sodium (COLACE) 100 MG capsule Take 1 capsule (100 mg total) by mouth 2 (two) times daily as needed. 30 capsule 2 Past Month   Prenatal Vit-Fe  Fumarate-FA (MULTIVITAMIN-PRENATAL) 27-0.8 MG TABS tablet Take 1 tablet by mouth daily at 12 noon.   05/11/2022   sertraline (ZOLOFT) 25 MG tablet Take 1-2 tablets (25-50 mg total) by mouth daily. Take 1 tab alternating with 2 every other day for 14 days. After that period  take 50 mg daily. 60 tablet 5 05/11/2022   Multiple Vitamins-Minerals (MULTIVITAMIN WITH MINERALS) tablet Take 1 tablet by mouth daily.      polyethylene glycol (MIRALAX) 17 g packet Take 17 g by mouth daily. 14 each 0    simethicone (GAS-X) 80 MG chewable tablet Chew 1 tablet (80 mg total) by mouth every 6 (six) hours as needed for flatulence. 30 tablet 0     Review of Systems  Constitutional:  Negative for chills and fever.  Gastrointestinal:  Positive for abdominal pain ("I get tense, but I am not in pain."). Negative for nausea and vomiting.  Genitourinary:  Positive for hematuria. Negative for decreased urine volume, difficulty urinating, dysuria and vaginal discharge.  Neurological:  Negative for dizziness, light-headedness and headaches.  Physical Exam   Blood pressure 119/68, pulse (!) 105, temperature 98.5 F (36.9 C), resp. rate 17, height 5' 1.5"  (1.562 m), weight 81.2 kg, last menstrual period 10/29/2021, SpO2 100 %, unknown if currently breastfeeding.  Physical Exam Vitals reviewed. Exam conducted with a chaperone present.  Constitutional:      Appearance: Normal appearance.  HENT:     Head: Normocephalic and atraumatic.  Eyes:     Conjunctiva/sclera: Conjunctivae normal.  Cardiovascular:     Rate and Rhythm: Normal rate.  Pulmonary:     Effort: Pulmonary effort is normal.  Abdominal:     General: Bowel sounds are normal.     Tenderness: There is no abdominal tenderness.  Genitourinary:    Urethra: No urethral pain or urethral lesion.     Comments: Speculum Exam: -Normal External Genitalia: Non tender, no apparent discharge or blood at urethra.  Meatus pink and without s/s of infection or inflammation.  No discharge at introitus.  -Vaginal Vault: Pink mucosa with good rugae. Scant amt discharge Cervix:Pink, no lesions, cysts, or polyps.  Appears closed. No active bleeding from os -Bimanual Exam:  Closed Musculoskeletal:        General: Normal range of motion.  Skin:    General: Skin is warm and dry.  Neurological:     Mental Status: She is alert and oriented to person, place, and time.  Psychiatric:        Mood and Affect: Mood normal.        Behavior: Behavior normal.    Fetal Assessment 120 bpm, Mod Var, -Decels, +Accels Toco: No ctx graphed  MAU Course   Results for orders placed or performed during the hospital encounter of 05/11/22 (from the past 24 hour(s))  Urinalysis, Routine w reflex microscopic Urine, Clean Catch     Status: Abnormal   Collection Time: 05/11/22 11:43 PM  Result Value Ref Range   Color, Urine RED (A) YELLOW   APPearance CLOUDY (A) CLEAR   Specific Gravity, Urine 1.010 1.005 - 1.030   pH 7.5 5.0 - 8.0   Glucose, UA NEGATIVE NEGATIVE mg/dL   Hgb urine dipstick LARGE (A) NEGATIVE   Bilirubin Urine NEGATIVE NEGATIVE   Ketones, ur NEGATIVE NEGATIVE mg/dL   Protein, ur 30 (A) NEGATIVE  mg/dL   Nitrite NEGATIVE NEGATIVE   Leukocytes,Ua TRACE (A) NEGATIVE  Urinalysis, Microscopic (reflex)     Status: Abnormal   Collection Time:  05/11/22 11:43 PM  Result Value Ref Range   RBC / HPF >50 0 - 5 RBC/hpf   WBC, UA 0-5 0 - 5 WBC/hpf   Bacteria, UA RARE (A) NONE SEEN   Squamous Epithelial / LPF 0-5 0 - 5   US RENAL  Result Date: 05/12/2022 CLINICAL DATA:  Initial evaluation for hematuria, abdominal tightness. Twenty-seven weeks pregnant. EXAM: RENAL / URINARY TRACT ULTRASOUND COMPLETE COMPARISON:  Prior CT from 11/28/2020. FINDINGS: Right Kidney: Renal measurements: 11.8 x 5.0 x 6.2 cm = volume: 189.3 mL. Renal echogenicity within normal limits. 5 mm nonobstructive calculus present at the interpolar region. No hydronephrosis. No focal renal mass. Left Kidney: Renal measurements: 12.2 x 6.0 x 5.8 cm = volume: 220.7 mL. Renal echogenicity within normal limits. No nephrolithiasis or hydronephrosis. No focal renal mass. Bladder: Appears normal for degree of bladder distention. Other: Gravid uterus partially visualized. IMPRESSION: 5 mm nonobstructive right renal calculus. No hydronephrosis or other acute abnormality. Electronically Signed   By: Rise MuBenjamin  McClintock M.D.   On: 05/12/2022 02:42    MDM PE Labs:UA EFM Renal US Assessment and Plan  36 year old G4P2012  SIUP at 27.2 weeks Cat I FT Hematuria  -POC Reviewed -Exam performed and findings discussed. -Reassured that no apparent vaginal or cervical bleeding. -Send for US to assess kidneys   Cherre RobinsJessica L Jada Fass MSN, CNM 05/12/2022, 1:00 AM   Reassessment (3:23 AM)  -NST Reactive -Ultrasound reveals right kidney stone. -Provider to bedside to discuss. -Patient continues to be without pain. -Informed of findings and management. -Precautions reviewed. -Referral for urology placed.  Patient instructed to give until Tuesday. -Patient to follow-up as scheduled. -Encouraged to call primary office or return to MAU if symptoms  worsen or with the onset of new symptoms. -Discharged to home in stable condition.

## 2022-05-12 NOTE — Progress Notes (Signed)
Gavin Pound CNM in earlier to discuss test results and d/c plan. Written and verbal d/c instructions given and understanding voiced.

## 2022-07-18 LAB — OB RESULTS CONSOLE GBS: GBS: NEGATIVE

## 2022-07-30 ENCOUNTER — Inpatient Hospital Stay (HOSPITAL_COMMUNITY): Payer: BC Managed Care – PPO | Admitting: Anesthesiology

## 2022-07-30 ENCOUNTER — Other Ambulatory Visit: Payer: Self-pay

## 2022-07-30 ENCOUNTER — Encounter (HOSPITAL_COMMUNITY): Payer: Self-pay | Admitting: Obstetrics and Gynecology

## 2022-07-30 ENCOUNTER — Inpatient Hospital Stay (HOSPITAL_COMMUNITY)
Admission: AD | Admit: 2022-07-30 | Discharge: 2022-08-01 | DRG: 807 | Disposition: A | Discharge status: Home / Self Care | Payer: BC Managed Care – PPO | Attending: Obstetrics and Gynecology | Admitting: Obstetrics and Gynecology

## 2022-07-30 DIAGNOSIS — Z6791 Unspecified blood type, Rh negative: Secondary | ICD-10-CM | POA: Diagnosis not present

## 2022-07-30 DIAGNOSIS — O4292 Full-term premature rupture of membranes, unspecified as to length of time between rupture and onset of labor: Secondary | ICD-10-CM | POA: Diagnosis present

## 2022-07-30 DIAGNOSIS — Z3490 Encounter for supervision of normal pregnancy, unspecified, unspecified trimester: Principal | ICD-10-CM

## 2022-07-30 DIAGNOSIS — O26893 Other specified pregnancy related conditions, third trimester: Secondary | ICD-10-CM | POA: Diagnosis present

## 2022-07-30 DIAGNOSIS — Z3A38 38 weeks gestation of pregnancy: Secondary | ICD-10-CM

## 2022-07-30 LAB — TYPE AND SCREEN
ABO/RH(D): A NEG
Antibody Screen: POSITIVE

## 2022-07-30 LAB — CBC
HCT: 37 % (ref 36.0–46.0)
Hemoglobin: 12.3 g/dL (ref 12.0–15.0)
MCH: 30.5 pg (ref 26.0–34.0)
MCHC: 33.2 g/dL (ref 30.0–36.0)
MCV: 91.8 fL (ref 80.0–100.0)
Platelets: 105 10*3/uL — ABNORMAL LOW (ref 150–400)
RBC: 4.03 MIL/uL (ref 3.87–5.11)
RDW: 14.6 % (ref 11.5–15.5)
WBC: 6.2 10*3/uL (ref 4.0–10.5)
nRBC: 0 % (ref 0.0–0.2)

## 2022-07-30 LAB — RPR: RPR Ser Ql: NONREACTIVE

## 2022-07-30 LAB — POCT FERN TEST: POCT Fern Test: POSITIVE

## 2022-07-30 MED ORDER — MEASLES, MUMPS & RUBELLA VAC IJ SOLR: 0.5000 mL | Freq: Once | INTRAMUSCULAR | Status: DC

## 2022-07-30 MED ORDER — OXYCODONE-ACETAMINOPHEN 5-325 MG PO TABS: 1.0000 | ORAL_TABLET | ORAL | Status: DC | PRN

## 2022-07-30 MED ORDER — DIBUCAINE (PERIANAL) 1 % EX OINT: 1.0000 | TOPICAL_OINTMENT | CUTANEOUS | Status: DC | PRN

## 2022-07-30 MED ORDER — SENNOSIDES-DOCUSATE SODIUM 8.6-50 MG PO TABS
2.0000 | ORAL_TABLET | Freq: Every day | ORAL | Status: DC
Start: 2022-07-31 — End: 2022-08-01
  Administered 2022-07-31 – 2022-08-01 (×2): 2 via ORAL
  Filled 2022-07-30 (×2): qty 2

## 2022-07-30 MED ORDER — LACTATED RINGERS IV SOLN: 500.0000 mL | Freq: Once | INTRAVENOUS | Status: DC

## 2022-07-30 MED ORDER — ACETAMINOPHEN 325 MG PO TABS
650.0000 mg | ORAL_TABLET | ORAL | Status: DC | PRN
  Administered 2022-07-30 – 2022-08-01 (×4): 650 mg via ORAL
  Filled 2022-07-30 (×4): qty 2

## 2022-07-30 MED ORDER — TETANUS-DIPHTH-ACELL PERTUSSIS 5-2.5-18.5 LF-MCG/0.5 IM SUSY: 0.5000 mL | PREFILLED_SYRINGE | Freq: Once | INTRAMUSCULAR | Status: DC

## 2022-07-30 MED ORDER — ONDANSETRON HCL 4 MG/2ML IJ SOLN: 4.0000 mg | INTRAMUSCULAR | Status: DC | PRN

## 2022-07-30 MED ORDER — PHENYLEPHRINE 80 MCG/ML (10ML) SYRINGE FOR IV PUSH (FOR BLOOD PRESSURE SUPPORT)
80.0000 ug | PREFILLED_SYRINGE | INTRAVENOUS | Status: DC | PRN
  Administered 2022-07-30: 80 ug via INTRAVENOUS
  Filled 2022-07-30: qty 10

## 2022-07-30 MED ORDER — ZOLPIDEM TARTRATE 5 MG PO TABS: 5.0000 mg | ORAL_TABLET | Freq: Every evening | ORAL | Status: DC | PRN

## 2022-07-30 MED ORDER — COCONUT OIL OIL: 1.0000 | TOPICAL_OIL | Status: DC | PRN

## 2022-07-30 MED ORDER — LACTATED RINGERS IV SOLN
INTRAVENOUS | Status: DC
Start: 2022-07-30 — End: 2022-07-30

## 2022-07-30 MED ORDER — ONDANSETRON HCL 4 MG PO TABS: 4.0000 mg | ORAL_TABLET | ORAL | Status: DC | PRN

## 2022-07-30 MED ORDER — LACTATED RINGERS IV SOLN: 500.0000 mL | INTRAVENOUS | Status: DC | PRN

## 2022-07-30 MED ORDER — MEDROXYPROGESTERONE ACETATE 150 MG/ML IM SUSP: 150.0000 mg | INTRAMUSCULAR | Status: DC | PRN

## 2022-07-30 MED ORDER — DIPHENHYDRAMINE HCL 50 MG/ML IJ SOLN: 12.5000 mg | INTRAMUSCULAR | Status: DC | PRN

## 2022-07-30 MED ORDER — ONDANSETRON HCL 4 MG/2ML IJ SOLN: 4.0000 mg | Freq: Four times a day (QID) | INTRAMUSCULAR | Status: DC | PRN

## 2022-07-30 MED ORDER — OXYCODONE-ACETAMINOPHEN 5-325 MG PO TABS: 2.0000 | ORAL_TABLET | ORAL | Status: DC | PRN

## 2022-07-30 MED ORDER — EPHEDRINE 5 MG/ML INJ: 10.0000 mg | INTRAVENOUS | Status: DC | PRN

## 2022-07-30 MED ORDER — PRENATAL MULTIVITAMIN CH
1.0000 | ORAL_TABLET | Freq: Every day | ORAL | Status: DC
  Filled 2022-07-30 (×2): qty 1

## 2022-07-30 MED ORDER — WITCH HAZEL-GLYCERIN EX PADS: 1.0000 | MEDICATED_PAD | CUTANEOUS | Status: DC | PRN

## 2022-07-30 MED ORDER — LIDOCAINE HCL (PF) 1 % IJ SOLN: 30.0000 mL | INTRAMUSCULAR | Status: DC | PRN

## 2022-07-30 MED ORDER — SERTRALINE HCL 50 MG PO TABS
50.0000 mg | ORAL_TABLET | Freq: Every day | ORAL | Status: DC
  Filled 2022-07-30 (×2): qty 1

## 2022-07-30 MED ORDER — IBUPROFEN 600 MG PO TABS
600.0000 mg | ORAL_TABLET | Freq: Four times a day (QID) | ORAL | Status: DC
  Administered 2022-07-31 – 2022-08-01 (×6): 600 mg via ORAL
  Filled 2022-07-30 (×7): qty 1

## 2022-07-30 MED ORDER — PANTOPRAZOLE SODIUM 20 MG PO TBEC
20.0000 mg | DELAYED_RELEASE_TABLET | Freq: Every day | ORAL | Status: DC
  Filled 2022-07-30 (×2): qty 1

## 2022-07-30 MED ORDER — DIPHENHYDRAMINE HCL 25 MG PO CAPS: 25.0000 mg | ORAL_CAPSULE | Freq: Four times a day (QID) | ORAL | Status: DC | PRN

## 2022-07-30 MED ORDER — BENZOCAINE-MENTHOL 20-0.5 % EX AERO
1.0000 | INHALATION_SPRAY | CUTANEOUS | Status: DC | PRN
  Filled 2022-07-30: qty 56

## 2022-07-30 MED ORDER — FENTANYL-BUPIVACAINE-NACL 0.5-0.125-0.9 MG/250ML-% EP SOLN
12.0000 mL/h | EPIDURAL | Status: DC | PRN
  Administered 2022-07-30: 12 mL/h via EPIDURAL
  Filled 2022-07-30: qty 250

## 2022-07-30 MED ORDER — SIMETHICONE 80 MG PO CHEW: 80.0000 mg | CHEWABLE_TABLET | ORAL | Status: DC | PRN

## 2022-07-30 MED ORDER — OXYTOCIN-SODIUM CHLORIDE 30-0.9 UT/500ML-% IV SOLN
1.0000 m[IU]/min | INTRAVENOUS | Status: DC
  Administered 2022-07-30: 2 m[IU]/min via INTRAVENOUS
  Filled 2022-07-30: qty 500

## 2022-07-30 MED ORDER — LIDOCAINE HCL (PF) 1 % IJ SOLN
INTRAMUSCULAR | Status: DC | PRN
  Administered 2022-07-30: 6 mL via EPIDURAL

## 2022-07-30 MED ORDER — TERBUTALINE SULFATE 1 MG/ML IJ SOLN: 0.2500 mg | Freq: Once | INTRAMUSCULAR | Status: DC | PRN

## 2022-07-30 MED ORDER — PHENYLEPHRINE 80 MCG/ML (10ML) SYRINGE FOR IV PUSH (FOR BLOOD PRESSURE SUPPORT)
80.0000 ug | PREFILLED_SYRINGE | INTRAVENOUS | Status: DC | PRN
Start: 2022-07-30 — End: 2022-07-30

## 2022-07-30 MED ORDER — OXYTOCIN-SODIUM CHLORIDE 30-0.9 UT/500ML-% IV SOLN: 2.5000 [IU]/h | INTRAVENOUS | Status: DC

## 2022-07-30 MED ORDER — ACETAMINOPHEN 325 MG PO TABS
650.0000 mg | ORAL_TABLET | ORAL | Status: DC | PRN
  Filled 2022-07-30: qty 2

## 2022-07-30 MED ORDER — SOD CITRATE-CITRIC ACID 500-334 MG/5ML PO SOLN: 30.0000 mL | ORAL | Status: DC | PRN

## 2022-07-30 MED ORDER — OXYTOCIN BOLUS FROM INFUSION
333.0000 mL | Freq: Once | INTRAVENOUS | Status: AC
  Administered 2022-07-30: 333 mL via INTRAVENOUS

## 2022-07-30 MED ORDER — HYDROXYZINE HCL 50 MG PO TABS: 50.0000 mg | ORAL_TABLET | Freq: Four times a day (QID) | ORAL | Status: DC | PRN

## 2022-07-30 NOTE — H&P (Signed)
OB History and Physical   Julia Hansen is a 36 y.o. female [phone] presenting for LOF at [redacted]w[redacted]d, SROM confirmed in MAU. She feels mild contractions, denies VB, reports FM.  Vertex presentation confirmed on bedside ultrasound.  Pregnancy has been uncomplicated.  Rh negative (s/p Rhogam 05/20/22), GBS negative (07/18/22), Panorama low risk female.    OB History     Gravida  4   Para  2   Term  2   Preterm      AB  1   Living  2      SAB  1   IAB      Ectopic  0   Multiple  0   Live Births  2          Past Medical History:  Diagnosis Date   Anxiety    Blood in urine    History of fainting    Insomnia    Low back pain    Macular eruption    Panic attacks    Seizures (HCC)    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    Past Surgical History:  Procedure Laterality Date   COLONOSCOPY     MOLE REMOVAL     MOUTH SURGERY     NASAL ENDOSCOPY     ROTATOR CUFF REPAIR     SHOULDER SURGERY Right    TONSILLECTOMY     WISDOM TOOTH EXTRACTION     Family History: family history includes ADD / ADHD in her father; COPD in her paternal grandmother; Cancer in her paternal grandfather; Hypertension in her mother. Social History:  reports that she has never smoked. She has never used smokeless tobacco. She reports that she does not currently use alcohol. She reports that she does not use drugs.     Maternal Diabetes: No Genetic Screening: Normal Maternal Ultrasounds/Referrals: Normal Fetal Ultrasounds or other Referrals:  None Maternal Substance Abuse:  No Significant Maternal Medications:  sertraline Significant Maternal Lab Results:  Group B Strep negative Other Comments:  None  Review of Systems - Patient denies fever, chills, SOB, CP, N/V/D.  History   Blood pressure 101/62, pulse 89, temperature 98.1 F (36.7 C), temperature source Oral, resp. rate 20, height 5' 1.5" (1.562 m), weight 83.1 kg, last menstrual period 10/29/2021, unknown if  currently breastfeeding. Exam Physical Exam   Gen: alert, well appearing, no distress Chest: nonlabored breathing CV: no peripheral edema Abdomen: soft, gravid  Ext: no evidence of DVT  Cervix most recently 1 cm in office  Prenatal labs: ABO, Rh: --/--/A NEG (10/29 1406) Antibody: NEG Performed at [facility], [address] [place], [place] [address]  (10/29 1406) Rubella:   RPR:    HBsAg:    HIV:    GBS:     Assessment/Plan: Admit to Labor and Delivery SROM confirmed in MAU.  Vertex confirmed on BSUS. Augment with pitocin Epidural when desired Anticipate vaginal delivery.    Lyn Henri 07/30/2022, 4:32 AM

## 2022-07-30 NOTE — MAU Note (Signed)
Shelton Silvas, MD performed bedside ultrasound and confirmed vertex presentation.

## 2022-07-30 NOTE — Lactation Note (Signed)
This note was copied from a baby's chart. Lactation Consultation Note  Patient Name: Julia Hansen [id number] Date: 07/30/2022 Reason for consult: L&D Initial assessment;Mother's request;Early term 37-38.6wks;Breastfeeding assistance Age:38 hours  LC assisted getting latch on right breast, inverted but will evert with a nipple roll. LC noted signs of milk transfer in midst of feeding. LC to follow up with birth parent and infant once on the [facility] unit.   Maternal Data Has patient been taught Hand Expression?: Yes Does the patient have breastfeeding experience prior to this delivery?: Yes How long did the patient breastfeed?: 6 months with pumping and supplementing with EBM and formula  Feeding Mother's Current Feeding Choice: Breast Milk  LATCH Score Latch: Repeated attempts needed to sustain latch, nipple held in mouth throughout feeding, stimulation needed to elicit sucking reflex.  Audible Swallowing: Spontaneous and intermittent  Type of Nipple: Everted at rest and after stimulation  Comfort (Breast/Nipple): Soft / non-tender  Hold (Positioning): Assistance needed to correctly position infant at breast and maintain latch.  LATCH Score: 8   Lactation Tools Discussed/Used    Interventions Interventions: Breast feeding basics reviewed;Assisted with latch;Skin to skin;Breast massage;Adjust position;Expressed milk;Education  Discharge    Consult Status Consult Status: Follow-up Date: 07/31/22 Follow-up type: In-patient    Kecia Swoboda  Nicholson-Springer 07/30/2022, 5:00 PM

## 2022-07-30 NOTE — MAU Note (Signed)
Pt says SROM at 0215- clear Feels some UC's  VE- 1-2 cm 2 weeks ago Denies HSV GBS- neg

## 2022-07-30 NOTE — Anesthesia Procedure Notes (Signed)
Epidural Patient location during procedure: OB Start time: 07/30/2022 9:42 AM End time: 07/30/2022 9:55 AM  Staffing Anesthesiologist: Lucretia Kern, MD Performed: anesthesiologist   Preanesthetic Checklist Completed: patient identified, IV checked, risks and benefits discussed, monitors and equipment checked, pre-op evaluation and timeout performed  Epidural Patient position: sitting Prep: DuraPrep Patient monitoring: heart rate, continuous pulse ox and blood pressure Approach: midline Location: L3-L4 Injection technique: LOR air  Needle:  Needle type: Tuohy  Needle gauge: 17 G Needle length: 9 cm Needle insertion depth: 6 cm Catheter type: closed end flexible Catheter size: 19 Gauge Catheter at skin depth: 11 cm Test dose: negative  Assessment Events: blood not aspirated, injection not painful, no injection resistance, no paresthesia and negative IV test  Additional Notes Reason for block:procedure for pain

## 2022-07-30 NOTE — Anesthesia Preprocedure Evaluation (Signed)
Anesthesia Evaluation  Patient identified by MRN, date of birth, ID band Patient awake    Reviewed: Allergy & Precautions, H&P , NPO status , Patient's Chart, lab work & pertinent test results  History of Anesthesia Complications Negative for: history of anesthetic complications  Airway Mallampati: II  TM Distance: >3 FB     Dental   Pulmonary neg pulmonary ROS,    Pulmonary exam normal        Cardiovascular negative cardio ROS   Rhythm:regular Rate:Normal     Neuro/Psych Seizures -,  Anxiety negative psych ROS   GI/Hepatic negative GI ROS, Neg liver ROS,   Endo/Other  negative endocrine ROS  Renal/GU negative Renal ROS  negative genitourinary   Musculoskeletal   Abdominal   Peds  Hematology negative hematology ROS (+)   Anesthesia Other Findings   Reproductive/Obstetrics (+) Pregnancy                             Anesthesia Physical Anesthesia Plan  ASA: 2  Anesthesia Plan: Epidural   Post-op Pain Management:    Induction:   PONV Risk Score and Plan:   Airway Management Planned:   Additional Equipment:   Intra-op Plan:   Post-operative Plan:   Informed Consent: I have reviewed the patients History and Physical, chart, labs and discussed the procedure including the risks, benefits and alternatives for the proposed anesthesia with the patient or authorized representative who has indicated his/her understanding and acceptance.       Plan Discussed with:   Anesthesia Plan Comments:         Anesthesia Quick Evaluation

## 2022-07-31 LAB — CBC
HCT: 32.6 % — ABNORMAL LOW (ref 36.0–46.0)
Hemoglobin: 11.1 g/dL — ABNORMAL LOW (ref 12.0–15.0)
MCH: 30.7 pg (ref 26.0–34.0)
MCHC: 34 g/dL (ref 30.0–36.0)
MCV: 90.1 fL (ref 80.0–100.0)
Platelets: 103 10*3/uL — ABNORMAL LOW (ref 150–400)
RBC: 3.62 MIL/uL — ABNORMAL LOW (ref 3.87–5.11)
RDW: 14.6 % (ref 11.5–15.5)
WBC: 8.9 10*3/uL (ref 4.0–10.5)
nRBC: 0 % (ref 0.0–0.2)

## 2022-07-31 NOTE — Social Work (Signed)
MOB was referred for history of depression/anxiety.  * Referral screened out by Clinical Social Worker because none of the following criteria appear to apply:  ~ History of anxiety/depression during this pregnancy, or of post-partum depression following prior delivery.  ~ Diagnosis of anxiety and/or depression within last 3 years OR * MOB's symptoms currently being treated with medication and/or therapy.  Per chart review MOB has an active prescription for Sertraline 25mg . No noted anxiety symptoms during this pregnancy.    Please contact the Clinical Social Worker if needs arise, by [facility] request, or if MOB scores greater than 9/yes to question 10 on Edinburgh Postpartum Depression Screen.   02-14-1980, LCSWA Clinical Social Worker [phone]

## 2022-07-31 NOTE — Lactation Note (Signed)
This note was copied from a baby's chart. Lactation Consultation Note  Patient Name: Julia Hansen Date: 07/31/2022 Reason for consult: Mother's request;Follow-up assessment;Early term 37-38.6wks;Infant weight loss (5 % weight loss, baby asleep, and Birth parent  woke as LC entered the room. Birth parent had request prior to D/C tomorrow, but requested to be able to nap before the baby awoke.) Age:38 hours Tools:  (Birth parent requested to be sized for a NS on the right nipple to semi inverted nipple just incase when she get homes and has difficulty latching and shells.)   Maternal Data    Feeding Mother's Current Feeding Choice: Breast Milk  LATCH Score ( Latch score by the [facility] RN )  Latch: Repeated attempts needed to sustain latch, nipple held in mouth throughout feeding, stimulation needed to elicit sucking reflex.  Audible Swallowing: Spontaneous and intermittent  Type of Nipple: Everted at rest and after stimulation  Comfort (Breast/Nipple): Soft / non-tender  Hold (Positioning): Assistance needed to correctly position infant at breast and maintain latch.  LATCH Score: 8   Lactation Tools Discussed/Used Tools:  (Birth parent requested to be sized for a NS on the right nipple to semi inverted nipple just incase when she get homes and has difficulty latching and shells.)  Interventions Interventions: Education  Discharge Pump: DEBP;Manual;Personal  Consult Status Consult Status: Follow-up Date: 08/01/22 Follow-up type: In-patient    Matilde Sprang [name] 07/31/2022, 6:42 PM

## 2022-07-31 NOTE — Progress Notes (Signed)
PPD # 1  Doing well BP 101/68 (BP Location: Right Arm)   Pulse 73   Temp 98.2 F (36.8 C) (Oral)   Resp 16   Ht 5' 1.5" (1.562 m)   Wt 83.1 kg   LMP 10/29/2021   SpO2 99%   Breastfeeding Unknown   BMI 34.07 kg/m  Results for orders placed or performed during the hospital encounter of 07/30/22 (from the past 24 hour(s))  CBC     Status: Abnormal   Collection Time: 07/31/22  5:59 AM  Result Value Ref Range   WBC 8.9 4.0 - 10.5 K/uL   RBC 3.62 (L) 3.87 - 5.11 MIL/uL   Hemoglobin 11.1 (L) 12.0 - 15.0 g/dL   HCT 09.3 (L) 26.7 - 12.4 %   MCV 90.1 80.0 - 100.0 fL   MCH 30.7 26.0 - 34.0 pg   MCHC 34.0 30.0 - 36.0 g/dL   RDW 58.0 99.8 - 33.8 %   Platelets 103 (L) 150 - 400 K/uL   nRBC 0.0 0.0 - 0.2 %   Lochia WNL Uterus Non tender  PPD # 1  Doing well  Routine care

## 2022-07-31 NOTE — Lactation Note (Signed)
This note was copied from a baby's chart. Lactation Consultation Note  Patient Name: Julia Hansen YPPJK'D Date: 07/31/2022 Reason for consult: Follow-up assessment;Early term 37-38.6wks;Breastfeeding assistance (LC assisted mom to latch on the right breast/ football . depth achieved after flipping upper lip and easing the baby's chin downward. per Birth parent - comfortable. stil feeding. LC provided shells and coconut oil for the left nippple ( soreness ).) Age:14 hours 2nd visit . LC provided shells and #24 NS as mom requested.  And coconut oil for the left.   Maternal Data Has patient been taught Hand Expression?: Yes  Feeding Mother's Current Feeding Choice: Breast Milk  LATCH Score Latch: Grasps breast easily, tongue down, lips flanged, rhythmical sucking.  Audible Swallowing: Spontaneous and intermittent  Type of Nipple: Everted at rest and after stimulation  Comfort (Breast/Nipple): Soft / non-tender  Hold (Positioning): Assistance needed to correctly position infant at breast and maintain latch.  LATCH Score: 9   Lactation Tools Discussed/Used Tools:  (NS #24  for when mom go hoe if issues with latching on the right breast per moms request)  Interventions Interventions: Breast feeding basics reviewed;Assisted with latch;Skin to skin;Breast massage;Hand express;Reverse pressure;Breast compression;Adjust position;Support pillows;Position options;Shells;Hand pump;Education  Discharge Pump: DEBP;Manual;Personal  Consult Status Consult Status: Follow-up Date: 08/01/22 Follow-up type: In-patient    Julia Hansen 07/31/2022, 7:48 PM

## 2022-07-31 NOTE — Anesthesia Postprocedure Evaluation (Signed)
Anesthesia Post Note  Patient: Adine Heimann Morriss  Procedure(s) Performed: AN AD HOC LABOR EPIDURAL     Patient location during evaluation: Mother Baby Anesthesia Type: Epidural Level of consciousness: awake and alert Pain management: pain level controlled Vital Signs Assessment: post-procedure vital signs reviewed and stable Respiratory status: spontaneous breathing, nonlabored ventilation and respiratory function stable Cardiovascular status: stable Postop Assessment: no headache, no backache and epidural receding Anesthetic complications: no   No notable events documented.  Last Vitals:  Vitals:   07/31/22 0015 07/31/22 0530  BP: 100/68 101/68  Pulse: 78 73  Resp: 18 16  Temp: 36.8 C 36.8 C  SpO2: 99% 99%    Last Pain:  Vitals:   07/31/22 0531  TempSrc:   PainSc: Asleep   Pain Goal:                   Cheyann Blecha

## 2022-08-01 MED ORDER — CEPHALEXIN 500 MG PO CAPS: 500.0000 mg | ORAL_CAPSULE | Freq: Two times a day (BID) | ORAL | 0 refills | Status: AC

## 2022-08-01 MED ORDER — IBUPROFEN 600 MG PO TABS: 600.0000 mg | ORAL_TABLET | Freq: Four times a day (QID) | ORAL | 0 refills | Status: AC

## 2022-08-01 NOTE — Discharge Instructions (Signed)
Call MD for T>100.4, heavy vaginal bleeding, severe abdominal pain, or respiratory distress.  Call office to schedule postpartum visit in 6 weeks.  Pelvic rest x 6 weeks.   

## 2022-08-01 NOTE — Progress Notes (Addendum)
Post Partum Day 2 Subjective: no complaints, up ad lib, voiding, and tolerating PO  Objective: Blood pressure 100/65, pulse 61, temperature 98.1 F (36.7 C), temperature source Oral, resp. rate 16, height 5' 1.5" (1.562 m), weight 83.1 kg, last menstrual period 10/29/2021, SpO2 99 %, unknown if currently breastfeeding.  Physical Exam:  General: alert, cooperative, and appears stated age Lochia: appropriate Uterine Fundus: firm Incision: n/a DVT Evaluation: No evidence of DVT seen on physical exam. Negative Homan's sign. No cords or calf tenderness.  Recent Labs    07/30/22 0444 07/31/22 0559  HGB 12.3 11.1*  HCT 37.0 32.6*    Assessment/Plan: Discharge home and Breastfeeding Patient has h/o mastitis-requests rx abx sent to have just in case.  Will rx keflex at d/c.   LOS: 2 days   Mitchel Honour, DO 08/01/2022, 11:34 AM

## 2022-08-01 NOTE — Lactation Note (Signed)
This note was copied from a baby's chart. Lactation Consultation Note  Patient Name: Julia Hansen [id number] Date: 08/01/2022 Reason for consult: Follow-up assessment;Mother's request;Early term 37-38.6wks;Infant weight loss;Nipple pain/trauma;Breastfeeding assistance Age:38 hours  Birth parent some shallow latches with bruising but skin intact. LC reviewed use of EBM and coconut oil for nipple care. We achieved a deeper latch in cross cradle prone reclined. Birth parent stated pain resolved with changes made.   Plan 1. To feed based on cues 8-12x 24 hr period. Birth parent to latch and look for signs of milk transfer.  2. Birth parent supplementing with formula with pace bottle feeding and slow flow nipple. Birth parent aware to offer more volume if infant not latching.  Birth parent not pumping at this time but will consider pumping once at home mostly at night.  All questions answered at the end of the visit.   Maternal Data Does the patient have breastfeeding experience prior to this delivery?: Yes How long did the patient breastfeed?: 6-9 months with pumping and formula supplementation  Feeding Mother's Current Feeding Choice: Breast Milk and Formula  LATCH Score Latch: Repeated attempts needed to sustain latch, nipple held in mouth throughout feeding, stimulation needed to elicit sucking reflex.  Audible Swallowing: Spontaneous and intermittent  Type of Nipple: Everted at rest and after stimulation  Comfort (Breast/Nipple): Soft / non-tender  Hold (Positioning): Assistance needed to correctly position infant at breast and maintain latch.  LATCH Score: 8   Lactation Tools Discussed/Used Tools: Coconut oil  Interventions Interventions: Breast feeding basics reviewed;Assisted with latch;Skin to skin;Breast massage;Hand express;Breast compression;Adjust position;Support pillows;Position options;Expressed milk;Coconut oil;Education;Pace feeding;LC Psychologist, educational;Infant  Driven Feeding Algorithm education  Discharge Discharge Education: Engorgement and breast care;Warning signs for feeding baby Pump: Personal;Manual  Consult Status Consult Status: Complete Date: 08/01/22    Pahola Dimmitt  Nicholson-Springer 08/01/2022, 12:46 PM

## 2022-08-01 NOTE — Discharge Summary (Signed)
Postpartum Discharge Summary  Patient Name: Julia Hansen DOB: 1986/01/06 MRN: [id number]  Date of admission: 07/30/2022 Delivery date:07/30/2022  Delivering provider: Louretta Shorten  Date of discharge: 08/01/2022  Admitting diagnosis: Pregnancy [Z34.90] Intrauterine pregnancy: [redacted]w[redacted]d    Secondary diagnosis:  Principal Problem:   Pregnancy  Additional problems: none    Discharge diagnosis: Term Pregnancy Delivered                                              Post partum procedures: none Augmentation: Pitocin Complications: None  Hospital course: Induction of Labor With Vaginal Delivery   36y.o. yo [id number] admitted to the hospital 07/30/2022 for induction of labor.  Indication for induction: PROM.  Patient had an uncomplicated labor course as follows: Membrane Rupture Time/Date: 2:15 AM ,07/30/2022   Delivery Method:Vaginal, Spontaneous  Episiotomy: None  Lacerations:  None  Details of delivery can be found in separate delivery note.  Patient had a routine postpartum course. Patient is discharged home 08/01/22.  Newborn Data: Birth date:07/30/2022  Birth time:3:56 PM  Gender:Female  Living status:Living  Apgars:8 ,8  Weight:3400 g   Magnesium Sulfate received: No BMZ received: No Rhophylac:No MMR:No T-DaP:Given prenatally Flu: No Transfusion:No  Physical exam  Vitals:   07/31/22 0530 07/31/22 1435 07/31/22 2010 08/01/22 0415  BP: 101/68 105/71 109/69 100/65  Pulse: 73 83 81 61  Resp: 16 20 18 16   Temp: 98.2 F (36.8 C) 98.3 F (36.8 C) 98.1 F (36.7 C) 98.1 F (36.7 C)  TempSrc: Oral Oral Oral Oral  SpO2: 99%  100% 99%  Weight:      Height:       General: alert, cooperative, and no distress Lochia: appropriate Uterine Fundus: firm Incision: N/A DVT Evaluation: No evidence of DVT seen on physical exam. Negative Homan's sign. No cords or calf tenderness. Labs: Lab Results  Component Value Date   WBC 8.9 07/31/2022   HGB 11.1 (L)  07/31/2022   HCT 32.6 (L) 07/31/2022   MCV 90.1 07/31/2022   PLT 103 (L) 07/31/2022      Latest Ref Rng & Units 12/11/2021    8:12 PM  CMP  Glucose 70 - 99 mg/dL 108   BUN 6 - 20 mg/dL 11   Creatinine 0.44 - 1.00 mg/dL 0.63   Sodium 135 - 145 mmol/L 135   Potassium 3.5 - 5.1 mmol/L 3.6   Chloride 98 - 111 mmol/L 104   CO2 22 - 32 mmol/L 22   Calcium 8.9 - 10.3 mg/dL 9.4    Edinburgh Score:    08/01/2022    4:15 AM  Edinburgh Postnatal Depression Scale Screening Tool  I have been able to laugh and see the funny side of things. 0  I have looked forward with enjoyment to things. 0  I have blamed myself unnecessarily when things went wrong. 1  I have been anxious or worried for no good reason. 1  I have felt scared or panicky for no good reason. 1  Things have been getting on top of me. 1  I have been so unhappy that I have had difficulty sleeping. 1  I have felt sad or miserable. 1  I have been so unhappy that I have been crying. 1  The thought of harming myself has occurred to me. 0  Edinburgh Postnatal Depression  Scale Total 7     After visit meds:  Allergies as of 08/01/2022       Reactions   Lactose Intolerance (gi) Other (See Comments)   Bloating and costipation   Sulfa Antibiotics    Trazodone And Nefazodone    Patient had immediate dizziness and shortness of breath and "fell on the floor".  Husband had to call EMS not sure if it is a panic attack vs. Possible medication reaction.     Prednisone Anxiety, Other (See Comments)   Patient states it "makes her psycho."        Medication List     STOP taking these medications    calcium carbonate 500 MG chewable tablet Commonly known as: TUMS - dosed in mg elemental calcium   DHA OMEGA 3 PO   docusate sodium 100 MG capsule Commonly known as: COLACE   pantoprazole 40 MG tablet Commonly known as: PROTONIX   simethicone 80 MG chewable tablet Commonly known as: Gas-X       TAKE these medications     cephALEXin 500 MG capsule Commonly known as: Keflex Take 1 capsule (500 mg total) by mouth 2 (two) times daily for 7 days.   ibuprofen 600 MG tablet Commonly known as: ADVIL Take 1 tablet (600 mg total) by mouth every 6 (six) hours.   multivitamin-prenatal 27-0.8 MG Tabs tablet Take 1 tablet by mouth daily at 12 noon.   sertraline 25 MG tablet Commonly known as: ZOLOFT Take 1-2 tablets (25-50 mg total) by mouth daily. Take 1 tab alternating with 2 every other day for 14 days. After that period  take 50 mg daily. What changed:  how much to take additional instructions         Discharge home in stable condition Infant Feeding: Breast Infant Disposition:home with mother Discharge instruction: per After Visit Summary and Postpartum booklet. Activity: Advance as tolerated. Pelvic rest for 6 weeks.  Diet: routine diet Future Appointments:No future appointments. Follow up Visit:PPV in 6 weeks   08/01/2022 Linda Hedges, DO

## 2022-08-07 ENCOUNTER — Telehealth (HOSPITAL_COMMUNITY): Payer: Self-pay | Admitting: *Deleted

## 2022-08-07 NOTE — Telephone Encounter (Signed)
Mom reports feeling good. No concerns about herself at this time. EPDS declined. Mom reports feeling well emotionally. [facility] score=7) Mom reports baby is doing well. Feeding, peeing, and pooping without difficulty. Safe sleep reviewed. Mom reports no concerns about baby at present.  Duffy Rhody, RN 08-07-2022 at 3:04pm

## 2022-09-23 ENCOUNTER — Other Ambulatory Visit: Payer: Self-pay | Admitting: Urology

## 2022-09-23 DIAGNOSIS — R310 Gross hematuria: Secondary | ICD-10-CM

## 2022-10-18 ENCOUNTER — Ambulatory Visit
Admission: RE | Admit: 2022-10-18 | Discharge: 2022-10-18 | Disposition: A | Discharge status: Home / Self Care | Payer: BC Managed Care – PPO | Source: Ambulatory Visit | Attending: Urology | Admitting: Urology

## 2022-10-18 DIAGNOSIS — R310 Gross hematuria: Secondary | ICD-10-CM

## 2023-11-05 ENCOUNTER — Encounter: Payer: Self-pay | Admitting: Orthopedic Surgery

## 2023-11-05 ENCOUNTER — Ambulatory Visit: Payer: BC Managed Care – PPO | Admitting: Orthopedic Surgery

## 2023-11-05 ENCOUNTER — Other Ambulatory Visit (INDEPENDENT_AMBULATORY_CARE_PROVIDER_SITE_OTHER): Payer: BC Managed Care – PPO

## 2023-11-05 DIAGNOSIS — G8929 Other chronic pain: Secondary | ICD-10-CM | POA: Diagnosis not present

## 2023-11-05 DIAGNOSIS — M25511 Pain in right shoulder: Secondary | ICD-10-CM

## 2023-11-05 DIAGNOSIS — M542 Cervicalgia: Secondary | ICD-10-CM

## 2023-11-05 NOTE — Progress Notes (Unsigned)
Office Visit Note   Patient: Julia Hansen           Date of Birth: [birth year]           MRN: [id number] Visit Date: 11/05/2023 Requested by: Wilfrid Lund, [place] [address] [place],  [place] [address] PCP: Wilfrid Lund, PA  Subjective: Chief Complaint  Patient presents with   Right Shoulder - Pain   Neck - Pain    HPI: [name] is a 37 y.o. female who presents to the office reporting right shoulder and proximal arm pain.  She has a history of injection years ago in his region.  She had an MRI arthrogram in 2021 which showed possible upper subscap region pathology.  She is play a lot of tennis.  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  Localizes pain along the biceps anteriorly as well as up underneath near the pec insertion.  Hurts for her to do any type of pulling with the arm extended.  Denies much in the way of neck pain or radiating symptoms below the elbow.+ Pain does bother her on a daily basis.                ROS: All systems reviewed are negative as they relate to the chief complaint within the history of present illness.  Patient denies fevers or chills.  Assessment & Plan: Visit Diagnoses:  1. Chronic right shoulder pain   2. Neck pain     Plan: Impression is right shoulder pain with what seems like he could be labral pathology or upper border subscap tearing.  She did have problems with her forehand in terms of pain with the crossarm motion which does put the subscap under some demand.  Not having discrete signs of's biceps tendon instability but that would be a consideration as well.  Partial pec tendon rupture could also be considered but the pec tendon did look good on the prior MRI scan from over 3 years ago.  Symptoms ongoing for several months with failure of conservative treatment including activity modification stretching as well as over-the-counter medication.  Plan MRI arthrogram right shoulder to  eval but subscap tendon as well as the biceps in the groove.  Follow-up after that study.  Follow-Up Instructions: No follow-ups on file.   Orders:  Orders Placed This Encounter  Procedures   XR Shoulder Right   XR Cervical Spine 2 or 3 views   DL FLUORO GUIDED NEEDLE PLC ASPIRATION / INJECTTION/LOC   MR SHOULDER RIGHT W CONTRAST   No orders of the defined types were placed in this encounter.     Procedures: No procedures performed   Clinical Data: No additional findings.  Objective: Vital Signs: There were no vitals taken for this visit.  Physical Exam:  Constitutional: Patient appears well-developed HEENT:  Head: Normocephalic Eyes:EOM are normal Neck: Normal range of motion Cardiovascular: Normal rate Pulmonary/chest: Effort normal Neurologic: Patient is alert Skin: Skin is warm Psychiatric: Patient has normal mood and affect  Ortho Exam: Ortho exam demonstrates good cervical spine range of motion.  EPL FPL interosseous strength is intact bilaterally with symmetric grip biceps triceps deltoid strength.  No definite paresthesias C5-T1.  Shoulder range of motion passively is 65 with some pain on the right but not on the left.  100/180 on both sides.  Excellent rotator cuff strength infraspinatus supraspinatus and subscap muscle testing but there is little pain with  resisted subscap testing.  O'Brien's testing is negative on the right negative on the left.  No discrete AC joint tenderness present on the right and left-hand side.  Specialty Comments:  No specialty comments available.  Imaging: No results found.   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     Family History  Problem Relation Age of Onset   Hypertension Mother    ADD / ADHD Father    COPD Paternal Grandmother    Cancer Paternal Grandfather     Past Surgical History:  Procedure Laterality Date   COLONOSCOPY     MOLE REMOVAL     MOUTH SURGERY     NASAL ENDOSCOPY     ROTATOR CUFF REPAIR     SHOULDER SURGERY Right    TONSILLECTOMY     WISDOM TOOTH EXTRACTION     Social History   Occupational History   Occupation: Corporate treasurer  Tobacco Use   Smoking status: Never   Smokeless tobacco: Never  Vaping Use   Vaping status: Never Used  Substance and Sexual Activity   Alcohol use: Not Currently   Drug use: No   Sexual activity: Yes    Partners: Male    Birth control/protection: None    Comment: 1st intercourse- 19, partners- 4, married- 3 yrs

## 2023-12-24 ENCOUNTER — Telehealth: Payer: Self-pay | Admitting: Orthopedic Surgery

## 2023-12-24 NOTE — Telephone Encounter (Signed)
 Called pt and was unable to leave a vm due to mailbox being full. Pt need an MRI Review 2 wk after 1/17 with Rozelle Corning

## 2023-12-26 ENCOUNTER — Ambulatory Visit
Admission: RE | Admit: 2023-12-26 | Discharge: 2023-12-26 | Disposition: A | Discharge status: Home / Self Care | Payer: BC Managed Care – PPO | Source: Ambulatory Visit | Attending: Orthopedic Surgery | Admitting: Orthopedic Surgery

## 2023-12-26 DIAGNOSIS — G8929 Other chronic pain: Secondary | ICD-10-CM

## 2023-12-26 MED ORDER — IOPAMIDOL (ISOVUE-M 200) INJECTION 41%
10.0000 mL | Freq: Once | INTRAMUSCULAR | Status: AC
Start: 2023-12-26 — End: 2023-12-26
  Administered 2023-12-26: 10 mL via INTRA_ARTICULAR

## 2024-01-14 ENCOUNTER — Ambulatory Visit: Payer: BC Managed Care – PPO | Admitting: Orthopedic Surgery

## 2024-01-14 ENCOUNTER — Other Ambulatory Visit: Payer: Self-pay

## 2024-01-14 DIAGNOSIS — G8929 Other chronic pain: Secondary | ICD-10-CM | POA: Diagnosis not present

## 2024-01-14 DIAGNOSIS — M25511 Pain in right shoulder: Secondary | ICD-10-CM | POA: Diagnosis not present

## 2024-01-14 DIAGNOSIS — M7521 Bicipital tendinitis, right shoulder: Secondary | ICD-10-CM

## 2024-01-14 DIAGNOSIS — M5412 Radiculopathy, cervical region: Secondary | ICD-10-CM | POA: Diagnosis not present

## 2024-01-15 ENCOUNTER — Encounter: Payer: Self-pay | Admitting: Orthopedic Surgery

## 2024-01-15 NOTE — Progress Notes (Signed)
 Office Visit Note   Patient: Julia Hansen           Date of Birth: 1986-06-09           MRN: [id number] Visit Date: 01/14/2024 Requested by: Alben Therisa MATSU, [place] [address] [place],  [place] [address] PCP: Alben Therisa MATSU, PA  Subjective: Chief Complaint  Patient presents with   Right Shoulder - Follow-up    MRI review    HPI: Julia Hansen is a 38 y.o. female who presents to the office reporting right shoulder pain.  Since she was last seen she has had an MRI scan of her shoulder.  She has a history of labral surgery about 10 years ago with Dr. Shari.  Reports a lot of biceps pain with extension.  Has a history of cortisone shot about 5 years ago.  States she is having some neck pain as well.  Most of her pain is not all that is in the front.  Pain does run down the right arm below the elbow.  MRI scan of that shoulder demonstrates moderate supraspinatus tendinosis severe infraspinatus tendinosis as well as moderate tendinosis of the intra-articular portion of the long head of the biceps tendon with a partial thickness tear.  The studies are reviewed with [facility]..                ROS: All systems reviewed are negative as they relate to the chief complaint within the history of present illness.  Patient denies fevers or chills.  Assessment & Plan: Visit Diagnoses:  1. Chronic right shoulder pain   2. Radiculopathy, cervical region     Plan: Impression is biceps tendon pathology with no definite upper border subscap tear.  Tendinosis is present in the rotator cuff but not really enough to warrant rotator cuff intervention.  I think that most Kayleanna could benefit when the time comes from arthroscopy labral debridement and biceps tenodesis.  That may affect her overhead serve to a small degree but is really her best option at 1 surgery to address the pathology of the biceps tendon in the shoulder.  I think based on her symptoms today she may also have a  component of cervical spine radiculopathy.  Symptoms are going down the right arm.  Going on for over a year with no response to activity modification therapy and over-the-counter medication.  We will see her back after the C-spine MRI.  Ultrasound-guided injection into the joint performed today.  Follow-Up Instructions: No follow-ups on file.   Orders:  Orders Placed This Encounter  Procedures   US  Guided Needle Placement - No Linked Charges   MR Cervical Spine w/o contrast   No orders of the defined types were placed in this encounter.     Procedures: Large Joint Inj on 01/14/2024 10:45 PM Indications: diagnostic evaluation and pain Details: 22 G 3.5 in needle, ultrasound-guided posterior approach  Arthrogram: No  Medications: 9 mL bupivacaine  0.5 %; 40 mg methylPREDNISolone  acetate 40 MG/ML; 5 mL lidocaine  1 % Outcome: tolerated well, no immediate complications Procedure, treatment alternatives, risks and benefits explained, specific risks discussed. Consent was given by the patient. Immediately prior to procedure a time out was called to verify the correct patient, procedure, equipment, support staff and site/side marked as required. Patient was prepped and draped in the usual sterile fashion.       Clinical Data: No additional findings.  Objective: Vital Signs: There were no vitals taken for  this visit.  Physical Exam:  Constitutional: Patient appears well-developed HEENT:  Head: Normocephalic Eyes:EOM are normal Neck: Normal range of motion Cardiovascular: Normal rate Pulmonary/chest: Effort normal Neurologic: Patient is alert Skin: Skin is warm Psychiatric: Patient has normal mood and affect  Ortho Exam: Ortho exam demonstrates pretty reasonable cervical spine range of motion but she does have some pain with rotation to the right.  No definite paresthesias C5-T1.  No muscle atrophy in the arm.  Radial pulse intact bilaterally.  She does have some pain with  O'Brien's testing on the right not on the left.  No discrete AC joint tenderness right versus left.  Negative apprehension relocation testing in the shoulder.  No restriction of passive external rotation which is about 75 degrees in both shoulders.  Rotator cuff strength is excellent infraspinatus supraspinatus and subscap muscle testing bilaterally.  Does have tenderness in the bicipital groove as well as tenderness in that pec tendon region.  Specialty Comments:  No specialty comments available.  Imaging: No results found.   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     Family History  Problem Relation Age of Onset   Hypertension Mother    ADD / ADHD Father    COPD Paternal Grandmother    Cancer Paternal Grandfather     Past Surgical History:  Procedure Laterality Date   COLONOSCOPY     MOLE REMOVAL     MOUTH SURGERY     NASAL ENDOSCOPY     ROTATOR CUFF REPAIR     SHOULDER SURGERY Right    TONSILLECTOMY     WISDOM TOOTH EXTRACTION     Social History   Occupational History   Occupation: corporate treasurer  Tobacco Use   Smoking status: Never    Smokeless tobacco: Never  Vaping Use   Vaping status: Never Used  Substance and Sexual Activity   Alcohol use: Not Currently   Drug use: No   Sexual activity: Yes    Partners: Male    Birth control/protection: None    Comment: 1st intercourse- 19, partners- 4, married- 3 yrs

## 2024-01-16 MED ORDER — BUPIVACAINE HCL 0.5 % IJ SOLN
9.0000 mL | INTRAMUSCULAR | Status: AC | PRN
  Administered 2024-01-14: 9 mL via INTRA_ARTICULAR

## 2024-01-16 MED ORDER — LIDOCAINE HCL 1 % IJ SOLN
5.0000 mL | INTRAMUSCULAR | Status: AC | PRN
  Administered 2024-01-14: 5 mL

## 2024-01-16 MED ORDER — METHYLPREDNISOLONE ACETATE 40 MG/ML IJ SUSP
40.0000 mg | INTRAMUSCULAR | Status: AC | PRN
  Administered 2024-01-14: 40 mg via INTRA_ARTICULAR

## 2024-01-19 ENCOUNTER — Ambulatory Visit: Payer: BC Managed Care – PPO | Admitting: Orthopedic Surgery

## 2024-01-21 ENCOUNTER — Encounter: Payer: Self-pay | Admitting: Orthopedic Surgery

## 2024-01-22 NOTE — Telephone Encounter (Signed)
Try gaba 100 tid and csp only covers neck thx

## 2024-01-22 NOTE — Telephone Encounter (Signed)
If cervical spine shows nothing on the right side then we can look at thoracic spine versus scapular MRI but those have typically been very low yield in my experience.  Also insurance highly unlikely to cover additional scans until these have come back not showing any localizing pathology.

## 2024-01-26 ENCOUNTER — Ambulatory Visit
Admission: RE | Admit: 2024-01-26 | Discharge: 2024-01-26 | Disposition: A | Discharge status: Home / Self Care | Payer: BC Managed Care – PPO | Source: Ambulatory Visit | Attending: Orthopedic Surgery | Admitting: Orthopedic Surgery

## 2024-01-26 DIAGNOSIS — M5412 Radiculopathy, cervical region: Secondary | ICD-10-CM

## 2024-02-05 NOTE — Progress Notes (Signed)
 I called she is better no rov needed she will call for neck shot and or shoulder doa and biceps tenodesis prn

## 2024-05-30 ENCOUNTER — Encounter: Payer: Self-pay | Admitting: Orthopedic Surgery

## 2024-06-01 NOTE — Telephone Encounter (Signed)
 Hi Katheryn can you reorder MRI arthrogram of the right shoulder with the reason being rotator cuff weakness following a fall with known history of severe 3 tendon tendinosis.  She may need to come in for evaluation with Herlene in order to get the scan done but we can try it without that if possible.  Thanks.

## 2024-06-02 ENCOUNTER — Other Ambulatory Visit: Payer: Self-pay

## 2024-06-02 DIAGNOSIS — M7521 Bicipital tendinitis, right shoulder: Secondary | ICD-10-CM

## 2024-06-02 DIAGNOSIS — G8929 Other chronic pain: Secondary | ICD-10-CM

## 2024-06-02 NOTE — Telephone Encounter (Signed)
 Referral has been placed.

## 2024-06-03 ENCOUNTER — Encounter: Payer: Self-pay | Admitting: Orthopedic Surgery

## 2024-06-16 ENCOUNTER — Ambulatory Visit
Admission: RE | Admit: 2024-06-16 | Discharge: 2024-06-16 | Disposition: A | Discharge status: Home / Self Care | Source: Ambulatory Visit | Attending: Orthopedic Surgery | Admitting: Orthopedic Surgery

## 2024-06-16 DIAGNOSIS — G8929 Other chronic pain: Secondary | ICD-10-CM

## 2024-06-16 DIAGNOSIS — M7521 Bicipital tendinitis, right shoulder: Secondary | ICD-10-CM

## 2024-06-16 MED ORDER — IOPAMIDOL (ISOVUE-M 200) INJECTION 41%
10.0000 mL | Freq: Once | INTRAMUSCULAR | Status: AC
  Administered 2024-06-16: 10 mL via INTRA_ARTICULAR

## 2024-06-28 ENCOUNTER — Ambulatory Visit: Payer: Self-pay | Admitting: Orthopedic Surgery

## 2024-06-28 NOTE — Progress Notes (Signed)
 Needs follow-up at some time.  Thanks

## 2024-06-29 ENCOUNTER — Telehealth: Payer: Self-pay | Admitting: Orthopedic Surgery

## 2024-06-29 NOTE — Telephone Encounter (Signed)
 Patient called and wanted to schedule for an MRI review but she stating she can't wait til August to come in so he needs to call her like he did before she stated. CB#[phone]

## 2024-07-06 NOTE — Telephone Encounter (Signed)
 I called  we talked a long time she is coming in the next Friday

## 2024-07-16 ENCOUNTER — Ambulatory Visit: Admitting: Orthopedic Surgery

## 2024-07-16 ENCOUNTER — Encounter: Payer: Self-pay | Admitting: Orthopedic Surgery

## 2024-07-16 DIAGNOSIS — M7521 Bicipital tendinitis, right shoulder: Secondary | ICD-10-CM

## 2024-07-16 NOTE — Progress Notes (Signed)
 Office Visit Note   Patient: Julia Hansen           Date of Birth: May 23, 1986           MRN: [id number] Visit Date: 07/16/2024 Requested by: Alben Therisa MATSU, [place] [address] [place],  [place] [address] PCP: Alben Therisa MATSU, PA  Subjective: Chief Complaint  Patient presents with   Other    Follow up to review MRI    HPI: Julia Hansen is a 38 y.o. female who presents to the office reporting right shoulder pain.  Since she was last seen she has had an MRI scan.  That MRI arthrogram is reviewed today.  She does have some tendinosis in the supraspinatus and infraspinatus.  Biceps tendon does appear to be perched medially in the bicipital groove just before it comes into the joint.  Has consistent with her symptoms of pain with internal rotation resistance as well as pain with extension.  She also describes discrete biceps pain anteriorly.  Similar findings present on MRI scan from January.  She states is hard for her to lift more than 5 pounds.  Lifting up hurts her arm.  Extension hurts her biceps..                ROS: All systems reviewed are negative as they relate to the chief complaint within the history of present illness.  Patient denies fevers or chills.  Assessment & Plan: Visit Diagnoses:  1. Biceps tendonitis on right     Plan: Impression is biceps subluxation without frank dislocation out of the groove.  I think she does have some upper subscap pathology which is consistent with her symptoms as well.  Does have some pain with resisted supination which is a pretty good test of biceps tendon pathology when it localizes to the shoulder.  Her subscap strength is intact bilaterally but it is painful.  No evidence of frozen shoulder.  All in all the course of the biceps tendon is altered on the coronal views in both MRI scans indicating some medialization of the tendon.  Her best bet for the chance that resolution to this particularly with 3 young children  and a desire to return to tennis would be arthroscopy with biceps tendon release, tenodesis, possible upper border subscap repair.  Risk and benefits are discussed with the patient including not limited to infection or vessel damage incomplete pain relief as well as incomplete functional restoration of the shoulder.  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  We will look to perform this around February.  Anticipate by the summer she should be returned to near full activities including tennis particularly feeding balls.  Follow-Up Instructions: No follow-ups on file.   Orders:  No orders of the defined types were placed in this encounter.  No orders of the defined types were placed in this encounter.     Procedures: No procedures performed   Clinical Data: No additional findings.  Objective: Vital Signs: There were no vitals taken for this visit.  Physical Exam:  Constitutional: Patient appears well-developed HEENT:  Head: Normocephalic Eyes:EOM are normal Neck: Normal range of motion Cardiovascular: Normal rate Pulmonary/chest: Effort normal Neurologic: Patient is alert Skin: Skin is warm Psychiatric: Patient has normal mood and affect  Ortho Exam: Ortho exam demonstrates symmetric range of motion of external rotation 60 degrees bilaterally with good subscap strength 5+ out of 5 in as well as external rotation strength.  Does have equivocal O'Brien's testing on the right positive speeds testing.  Does have some pain with resisted supination on the right localizing to the bicipital groove.  No coarse grinding or popping with internal/external rotation of the right arm.  Specialty Comments:  No specialty comments available.  Imaging: No results found.   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     Family History  Problem Relation Age of Onset   Hypertension Mother    ADD / ADHD Father    COPD Paternal Grandmother    Cancer Paternal Grandfather     Past Surgical History:  Procedure Laterality Date   COLONOSCOPY     MOLE REMOVAL     MOUTH SURGERY     NASAL ENDOSCOPY     ROTATOR CUFF REPAIR     SHOULDER SURGERY Right    TONSILLECTOMY     WISDOM TOOTH EXTRACTION     Social History   Occupational History   Occupation: Corporate treasurer  Tobacco Use   Smoking status: Never   Smokeless tobacco: Never  Vaping Use   Vaping status: Never Used  Substance and Sexual Activity   Alcohol use: Not Currently   Drug use: No   Sexual activity: Yes    Partners: Male    Birth control/protection: None    Comment: 1st intercourse- 19, partners- 4, married- 3 yrs

## 2024-07-23 ENCOUNTER — Telehealth: Payer: Self-pay | Admitting: Orthopedic Surgery

## 2024-07-23 NOTE — Telephone Encounter (Signed)
 Called patient to offer surgery date for next year (FEB 2026).  No answer.  Left message on voicemail providing my name and direct number for scheduling.

## 2024-09-07 ENCOUNTER — Ambulatory Visit (INDEPENDENT_AMBULATORY_CARE_PROVIDER_SITE_OTHER): Admitting: Allergy & Immunology

## 2024-09-07 ENCOUNTER — Encounter: Payer: Self-pay | Admitting: Allergy & Immunology

## 2024-09-07 ENCOUNTER — Other Ambulatory Visit: Payer: Self-pay

## 2024-09-07 VITALS — BP 108/62 | HR 104 | Temp 97.6°F | Resp 18 | Ht 59.06 in | Wt 182.0 lb

## 2024-09-07 DIAGNOSIS — L281 Prurigo nodularis: Secondary | ICD-10-CM | POA: Diagnosis not present

## 2024-09-07 DIAGNOSIS — L299 Pruritus, unspecified: Secondary | ICD-10-CM | POA: Diagnosis not present

## 2024-09-07 NOTE — Patient Instructions (Addendum)
 1. Prurigo nodularis with marked pruritus - We are going to get some labs to look weird causes of itching. - We will get some labs to rule out serious causes of itching: alpha gla panel, complete blood count, tryptase level, chronic urticaria panel, CMP, ESR, and CRP. - Chronic hives/itching are often times a self limited process and will burn themselves out over 6-12 months, although this is not always the case.  - We can talk medications once we have the lab results back.  - We can try a cetirizine 1-2 tablets at night to help with the itching.  - We could consider some kind of injection like Nemluvio that can help to decrease the itching, but I want to look into underlying causes first.   2. Return in about 4 weeks (around 10/05/2024). You can have the follow up appointment with Dr. Iva or a Nurse Practicioner (our Nurse Practitioners are excellent and always have Physician oversight!).    Please inform us  of any Emergency Department visits, hospitalizations, or changes in symptoms. Call us  before going to the ED for breathing or allergy symptoms since we might be able to fit you in for a sick visit. Feel free to contact us  anytime with any questions, problems, or concerns.  It was a pleasure to meet you today!  Websites that have reliable patient information: 1. American Academy of Asthma, Allergy, and Immunology: www.aaaai.org 2. Food Allergy Research and Education (FARE): foodallergy.org 3. Mothers of Asthmatics: http://www.asthmacommunitynetwork.org 4. American College of Allergy, Asthma, and Immunology: www.acaai.org      "Like" us  on Facebook and Instagram for our latest updates!      A healthy democracy works best when [employer] participate! Make sure you are registered to vote! If you have moved or changed any of your contact information, you will need to get this updated before voting! Scan the QR codes below to learn more!

## 2024-09-07 NOTE — Progress Notes (Addendum)
 NEW PATIENT  Date of Service/Encounter:  09/07/24  Consult requested by: Alben Therisa MATSU, PA   Assessment:   Prurigo nodularis - getting outside records from Dermatology  Pruritus  Plan/Recommendations:   1. Prurigo nodularis with marked pruritus - We are going to get some labs to look weird causes of itching. - We will get some labs to rule out serious causes of itching: alpha gla panel, complete blood count, tryptase level, chronic urticaria panel, CMP, ESR, and CRP. - Chronic hives/itching are often times a self limited process and will burn themselves out over 6-12 months, although this is not always the case.  - We can talk medications once we have the lab results back.  - We can try a cetirizine 1-2 tablets at night to help with the itching.  - We could consider some kind of injection like Nemluvio that can help to decrease the itching, but I want to look into underlying causes first.   2. Return in about 4 weeks (around 10/05/2024). You can have the follow up appointment with Dr. Iva or a Nurse Practicioner (our Nurse Practitioners are excellent and always have Physician oversight!).    This note in its entirety was forwarded to the Provider who requested this consultation.  Subjective:   Julia Hansen is a 38 y.o. 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  Discussed the use of AI scribe software for clinical note transcription with the patient and/or guardian, who gave verbal consent to proceed.  Odella Regenia Stowers was referred by Alben Therisa MATSU, PA.     Julia Hansen is a 39 y.o. female presenting for an evaluation of itching and a rash.  She has experienced chronic skin itching and bleeding, primarily on her arms, for several years. The sensation is described as 'pins and needles' and worsens with physical activity. Patch testing revealed sensitivities to mold, gold, and dust. Despite mold remediation efforts at home, symptoms persist. She avoids regular use of antihistamines or topical steroids due to a preference to avoid medications unless necessary. No environmental allergies such as sneezing or itchy, watery eyes.  She has had a biopsy performed by Dr. Bonney at [facility].  She does not think that it was all that enlightening.  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 Milk consumption results in significant bloating, described as appearing 'six months pregnant,' along with burping and discomfort lasting several days. She avoids all forms of milk, including baked goods containing milk, despite not having a formal diagnosis of lactose intolerance. Previous endoscopy, colonoscopy, and a breath test for gut bacteria were normal.  Her past medical  history includes panic attacks managed with sertraline  and Xanax as needed, and a torn shoulder from tennis, which limits her activities. 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  She is a mother of three children, ages two, four, and 48, and  manages a busy household.     She had an endoscopyaround 6-7 years ago. Total duration fo symptoms is a 8years.   Otherwise, there is no history of other atopic diseases, including drug allergies, stinging insect allergies, or contact dermatitis. There is no significant infectious history. Vaccinations are up to date.    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  Husband had to call EMS not sure if it is a panic attack vs. Possible medication reaction.     Prednisone Anxiety, Other (See Comments)   Patient states it makes her psycho.        Medication List        Accurate as of September 07, 2024 12:28 PM. If you have any questions, ask your nurse or doctor.          ALPRAZolam 0.5 MG tablet Commonly known as: XANAX 1 tablet as needed Orally Twice a day for 30 days   etonogestrel -ethinyl estradiol  0.12-0.015 MG/24HR vaginal ring Commonly known as: NUVARING SMARTSIG:1 ring Vaginal Once a Month   ibuprofen  600 MG tablet Commonly known as: ADVIL  Take 1 tablet (600 mg total) by mouth every 6 (six)  hours.   multivitamin-prenatal 27-0.8 MG Tabs tablet Take 1 tablet by mouth daily at 12 noon.   sertraline  25 MG tablet Commonly known as: ZOLOFT  Take 1-2 tablets (25-50 mg total) by mouth daily. Take 1 tab alternating with 2 every other day for 14 days. After that period  take 50 mg daily.        Birth History: non-contributory  Developmental History: non-contributory  Past Surgical History: Past Surgical History:  Procedure Laterality Date   COLONOSCOPY     MOLE REMOVAL     MOUTH SURGERY     NASAL ENDOSCOPY     ROTATOR CUFF REPAIR     SHOULDER SURGERY Right    TONSILLECTOMY     WISDOM TOOTH EXTRACTION       Family History: Family History  Problem Relation Age of Onset   Hypertension Mother    ADD / ADHD Father    COPD Paternal Grandmother    Cancer Paternal Grandfather      Social History: Rosella lives at home with her family. She works with a [employer] Open enrollment starts tomorrow, so she is a bit stressed  about it.    Review of systems otherwise negative other than that mentioned in the HPI.    Objective:   Blood pressure 108/62, pulse (!) 104, temperature 97.6 F (36.4 C), temperature source Temporal, resp. rate 18, height 4' 11.06 (1.5 m), weight 182 lb (82.6 kg), SpO2 97%, unknown if currently breastfeeding. Body mass index is 36.69 kg/m.     Physical Exam Vitals reviewed.  Constitutional:      Appearance: She is well-developed.  HENT:     Head: Normocephalic and atraumatic.     Right Ear: Tympanic membrane, ear canal and external ear normal. No drainage, swelling or tenderness. Tympanic membrane is not injected, scarred, erythematous, retracted or bulging.     Left Ear: Tympanic membrane, ear canal and external ear normal. No drainage, swelling or tenderness. Tympanic membrane is not injected, scarred, erythematous, retracted or bulging.     Nose: No nasal deformity, septal deviation, mucosal edema or rhinorrhea.      Right Turbinates: Enlarged, swollen and pale.     Left Turbinates: Enlarged, swollen and pale.     Right Sinus: No maxillary sinus tenderness or frontal sinus tenderness.     Left Sinus: No maxillary sinus tenderness or frontal sinus tenderness.     Mouth/Throat:     Lips: Pink.     Mouth: Mucous membranes are moist. Mucous membranes are not pale and not dry.     Pharynx: Uvula midline.  Eyes:     General: Lids are normal. Allergic shiner present.        Right eye: No discharge.        Left eye: No discharge.     Conjunctiva/sclera: Conjunctivae normal.     Right eye: Right conjunctiva is not injected. No chemosis.    Left eye: Left conjunctiva is not injected. No chemosis.    Pupils: Pupils are equal, round, and reactive to light.  Cardiovascular:     Rate and Rhythm: Normal rate and regular rhythm.     Heart sounds: Normal heart sounds.  Pulmonary:     Effort: Pulmonary effort is normal. No tachypnea, accessory muscle usage or respiratory distress.     Breath sounds: Normal breath sounds. No wheezing, rhonchi or rales.     Comments: Moving air well in all lung fields.  No increased work of breathing. Chest:     Chest wall: No tenderness.  Abdominal:     Tenderness: There is no abdominal tenderness. There is no guarding or rebound.  Lymphadenopathy:     Head:     Right side of head: No submandibular, tonsillar or occipital adenopathy.     Left side of head: No submandibular, tonsillar or occipital adenopathy.     Cervical: No cervical adenopathy.  Skin:    General: Skin is warm.     Capillary Refill: Capillary refill takes less than 2 seconds.     Coloration: Skin is not pale.     Findings: No abrasion, erythema or petechiae. Rash is not urticarial or vesicular.     Comments: She has multiple lesions on her bialteral arms with excoriations present with some scarring. The lesions are large and hardened.   Neurological:     Mental Status: She is alert.  Psychiatric:         Behavior: Behavior is cooperative.      Diagnostic studies: deferred due to insurance stipulations that require a separate visit for testing         Marty Shaggy, MD [facility] 

## 2024-09-10 LAB — ANTINUCLEAR ANTIBODIES, IFA: ANA Titer 1: NEGATIVE

## 2024-09-10 LAB — CMP14+EGFR
ALT: 51 IU/L — ABNORMAL HIGH (ref 0–32)
AST: 30 IU/L (ref 0–40)
Albumin: 4.3 g/dL (ref 3.9–4.9)
Alkaline Phosphatase: 68 IU/L (ref 41–116)
BUN/Creatinine Ratio: 14 (ref 9–23)
BUN: 11 mg/dL (ref 6–20)
Bilirubin Total: 0.3 mg/dL (ref 0.0–1.2)
CO2: 21 mmol/L (ref 20–29)
Calcium: 9.7 mg/dL (ref 8.7–10.2)
Chloride: 102 mmol/L (ref 96–106)
Creatinine, Ser: 0.8 mg/dL (ref 0.57–1.00)
Globulin, Total: 2.9 g/dL (ref 1.5–4.5)
Glucose: 76 mg/dL (ref 70–99)
Potassium: 4.2 mmol/L (ref 3.5–5.2)
Sodium: 139 mmol/L (ref 134–144)
Total Protein: 7.2 g/dL (ref 6.0–8.5)
eGFR: 96 mL/min/1.73

## 2024-09-10 LAB — THYROID ANTIBODIES (THYROPEROXIDASE & THYROGLOBULIN)
Thyroglobulin Antibody: <1 [IU]/mL (ref 0.0–0.9)
Thyroperoxidase Ab SerPl-aCnc: 10 [IU]/mL (ref 0–34)

## 2024-09-10 LAB — CBC WITH DIFF/PLATELET
Basophils Absolute: 0 x10E3/uL (ref 0.0–0.2)
Basos: 1 %
EOS (ABSOLUTE): 0.1 x10E3/uL (ref 0.0–0.4)
Eos: 2 %
Hematocrit: 44.7 % (ref 34.0–46.6)
Hemoglobin: 14.7 g/dL (ref 11.1–15.9)
Immature Grans (Abs): 0 x10E3/uL (ref 0.0–0.1)
Immature Granulocytes: 0 %
Lymphocytes Absolute: 1.9 x10E3/uL (ref 0.7–3.1)
Lymphs: 29 %
MCH: 30.4 pg (ref 26.6–33.0)
MCHC: 32.9 g/dL (ref 31.5–35.7)
MCV: 92 fL (ref 79–97)
Monocytes Absolute: 0.5 x10E3/uL (ref 0.1–0.9)
Monocytes: 8 %
Neutrophils Absolute: 4 x10E3/uL (ref 1.4–7.0)
Neutrophils: 60 %
Platelets: 202 x10E3/uL (ref 150–450)
RBC: 4.84 x10E6/uL (ref 3.77–5.28)
RDW: 12.4 % (ref 11.7–15.4)
WBC: 6.6 x10E3/uL (ref 3.4–10.8)

## 2024-09-10 LAB — C-REACTIVE PROTEIN: CRP: 5 mg/L (ref 0–10)

## 2024-09-10 LAB — SEDIMENTATION RATE: Sed Rate: 15 mm/h (ref 0–32)

## 2024-09-10 LAB — MILK COMPONENT PANEL
F076-IgE Alpha Lactalbumin: <0.1 kU/L
F077-IgE Beta Lactoglobulin: <0.1 kU/L
F078-IgE Casein: <0.1 kU/L

## 2024-09-10 LAB — CHRONIC URTICARIA PD-BAT: Pooled Donor- BAT CU: 6.1 % (ref 0.00–10.60)

## 2024-09-10 LAB — ALPHA-GAL PANEL
Allergen Lamb IgE: <0.1 kU/L
Beef IgE: <0.1 kU/L
IgE (Immunoglobulin E), Serum: 57 [IU]/mL (ref 6–495)
O215-IgE Alpha-Gal: <0.1 kU/L
Pork IgE: <0.1 kU/L

## 2024-09-10 LAB — TRYPTASE: Tryptase: 7.9 ug/L (ref 2.2–13.2)

## 2024-09-12 ENCOUNTER — Ambulatory Visit: Payer: Self-pay | Admitting: Allergy & Immunology

## 2024-10-05 ENCOUNTER — Ambulatory Visit: Admitting: Allergy & Immunology

## 2024-10-05 ENCOUNTER — Other Ambulatory Visit: Payer: Self-pay

## 2024-10-05 ENCOUNTER — Encounter: Payer: Self-pay | Admitting: Allergy & Immunology

## 2024-10-05 VITALS — BP 120/82 | HR 86 | Temp 98.1°F | Resp 18 | Ht 60.63 in | Wt 184.4 lb

## 2024-10-05 DIAGNOSIS — L299 Pruritus, unspecified: Secondary | ICD-10-CM

## 2024-10-05 DIAGNOSIS — R748 Abnormal levels of other serum enzymes: Secondary | ICD-10-CM | POA: Diagnosis not present

## 2024-10-05 DIAGNOSIS — L281 Prurigo nodularis: Secondary | ICD-10-CM

## 2024-10-05 NOTE — Patient Instructions (Addendum)
 1. Prurigo nodularis with marked pruritus - with normal labs - We are going to repeat the liver function testing and get a hepatitis panel to make sure that you do not have a viral infection. - I think this is going to come back normal. - It looks like you have a good handle on your itching/skin. - You could consider doing patch testing to the most common chemicals. - We have a plan of 80 different sensitizing chemicals.  - CPT code [place] 95044 (total of 80).  2. Return in about 6 weeks (around 11/16/2024), or if symptoms worsen or fail to improve, for [facility] TESTING. You can have the follow up appointment with Dr. Iva or a Nurse Practicioner (our Nurse Practitioners are excellent and always have Physician oversight!).    Please inform us  of any Emergency Department visits, hospitalizations, or changes in symptoms. Call us  before going to the ED for breathing or allergy symptoms since we might be able to fit you in for a sick visit. Feel free to contact us  anytime with any questions, problems, or concerns.  It was a pleasure to see you again today!  Websites that have reliable patient information: 1. American Academy of Asthma, Allergy, and Immunology: www.aaaai.org 2. Food Allergy Research and Education (FARE): foodallergy.org 3. Mothers of Asthmatics: http://www.asthmacommunitynetwork.org 4. American College of Allergy, Asthma, and Immunology: www.acaai.org      "Like" us  on Facebook and Instagram for our latest updates!      A healthy democracy works best when [employer] participate! Make sure you are registered to vote! If you have moved or changed any of your contact information, you will need to get this updated before voting! Scan the QR codes below to learn more!

## 2024-10-05 NOTE — Progress Notes (Unsigned)
 FOLLOW UP  Date of Service/Encounter:  10/05/24   Assessment:   Prurigo nodularis - markedly improved   Pruritus  Plan/Recommendations:   1. 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 - I think this is going to come back normal. - It looks like you have a good handle on your itching/skin. - You could consider doing patch testing to the most common chemicals. - We have a plan of 80 different sensitizing chemicals.  - [place]PT code [place] 95044 (total of 80).  2. Return in about 6 weeks (around 11/16/2024), or if symptoms worsen or fail to improve, for [facility] TESTING. You can have the follow up appointment with Dr. Iva or a Nurse Practicioner (our Nurse Practitioners are excellent and always have Physician oversight!).   Subjective:   Julia Hansen is a 38 y.o. 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  Discussed the use of AI scribe software for clinical note transcription with the patient and/or guardian, who gave verbal consent to  proceed.  Julia Hansen is a 38 y.o. female presenting for a follow up visit. She was last seen in September 2025 as a New Patient. At that time, we obtained labs due to her pruritus. She was not having any allergic rhinitis symptoms, so we held off on testing. She did not think that this was related to foods, so we did not do food testing at all. She did have a slightly elevated ALT, but otherwise everything was stable.   Her skin condition has been improving after discontinuing all previous lotions and skincare products. No current itching is reported.  She experiences significant bloating and discomfort when consuming milk or milk products, with symptoms lasting several days. Even lactate milk and yeast cause similar symptoms. She avoids milk products as much as possible but finds it challenging due to their presence in many foods.  Recent lab work showed a slightly elevated ALT level. She denies any recent alcohol consumption or illness that might explain this finding.  She has a history of patch testing for allergies conducted several years ago and recalls 'mold and gold' from the experience, but does not remember the specific results. She does not recall any other significant findings from that testing and has not had any recent environmental allergy symptoms such as runny nose or sneezing.     Otherwise, there have been no changes to her past medical history, surgical history, family history, or social history.    Review of systems otherwise negative other than that mentioned in the HPI.    Objective:  Blood pressure 120/82, pulse 86, temperature 98.1 F (36.7 C), temperature source Temporal, resp. rate 18, height 5' 0.63 (1.54 m), weight 184 lb 6.4 oz (83.6 kg), SpO2 99%, unknown if currently breastfeeding. Body mass index is 35.27 kg/m.    Physical Exam Vitals reviewed.  Constitutional:      Appearance: She is well-developed.     Comments: Talkative. Flight of ideas.   HENT:      Head: Normocephalic and atraumatic.     Right Ear: Tympanic membrane, ear canal and external ear normal.     Left Ear: Tympanic membrane, ear canal and external ear normal.     Nose: No nasal deformity, septal deviation, mucosal edema or rhinorrhea.     Right Turbinates: Enlarged, swollen and pale.     Left Turbinates: Enlarged, swollen and pale.     Right Sinus: No maxillary sinus tenderness or frontal sinus tenderness.     Left Sinus: No maxillary sinus tenderness or frontal sinus tenderness.     Mouth/Throat:     Lips: Pink.     Mouth: Mucous membranes are moist. Mucous membranes are not pale and not dry.     Pharynx: Uvula midline.  Eyes:     General: Lids are normal. No allergic shiner.       Right eye: No discharge.        Left eye: No discharge.     Conjunctiva/sclera: Conjunctivae normal.     Right eye: Right conjunctiva is not injected. No chemosis.    Left eye: Left conjunctiva is not injected. No chemosis.    Pupils: Pupils are equal, round, and reactive to light.  Cardiovascular:     Rate and Rhythm: Normal rate and regular rhythm.     Heart sounds: Normal heart sounds.  Pulmonary:     Effort: Pulmonary effort is normal. No tachypnea, accessory muscle usage or respiratory distress.     Breath sounds: Normal breath sounds. No wheezing, rhonchi or rales.  Chest:     Chest wall: No tenderness.  Lymphadenopathy:     Cervical: No cervical adenopathy.  Skin:    Coloration: Skin is not pale.     Findings: No abrasion, erythema, petechiae or rash. Rash is not papular, urticarial or vesicular.  Neurological:     Mental Status: She is alert.  Psychiatric:        Behavior: Behavior is cooperative.      Diagnostic studies: labs sent instead      Marty Shaggy, MD  [facility] 

## 2024-10-06 ENCOUNTER — Ambulatory Visit: Payer: Self-pay | Admitting: Allergy & Immunology

## 2024-10-06 LAB — CMP14+EGFR
ALT: 38 IU/L — ABNORMAL HIGH (ref 0–32)
AST: 22 IU/L (ref 0–40)
Albumin: 4.3 g/dL (ref 3.9–4.9)
Alkaline Phosphatase: 61 IU/L (ref 41–116)
BUN/Creatinine Ratio: 17 (ref 9–23)
BUN: 12 mg/dL (ref 6–24)
Bilirubin Total: 0.2 mg/dL (ref 0.0–1.2)
CO2: 21 mmol/L (ref 20–29)
Calcium: 9.6 mg/dL (ref 8.7–10.2)
Chloride: 101 mmol/L (ref 96–106)
Creatinine, Ser: 0.72 mg/dL (ref 0.57–1.00)
Globulin, Total: 2.5 g/dL (ref 1.5–4.5)
Glucose: 81 mg/dL (ref 70–99)
Potassium: 3.9 mmol/L (ref 3.5–5.2)
Sodium: 137 mmol/L (ref 134–144)
Total Protein: 6.8 g/dL (ref 6.0–8.5)
eGFR: 108 mL/min/1.73 (ref >59)

## 2024-10-06 LAB — ACUTE VIRAL HEPATITIS (HAV, HBV, HCV)
HCV Ab: NONREACTIVE
Hep A IgM: NEGATIVE
Hep B C IgM: NEGATIVE
Hepatitis B Surface Ag: NEGATIVE

## 2024-10-06 LAB — HCV INTERPRETATION

## 2024-10-11 ENCOUNTER — Encounter: Payer: Self-pay | Admitting: Radiology

## 2024-10-27 ENCOUNTER — Other Ambulatory Visit: Payer: Self-pay

## 2024-10-27 ENCOUNTER — Other Ambulatory Visit (INDEPENDENT_AMBULATORY_CARE_PROVIDER_SITE_OTHER): Payer: Self-pay

## 2024-10-27 ENCOUNTER — Ambulatory Visit: Admitting: Surgical

## 2024-10-27 ENCOUNTER — Encounter: Payer: Self-pay | Admitting: Surgical

## 2024-10-27 DIAGNOSIS — M19079 Primary osteoarthritis, unspecified ankle and foot: Secondary | ICD-10-CM | POA: Diagnosis not present

## 2024-10-27 DIAGNOSIS — M79671 Pain in right foot: Secondary | ICD-10-CM

## 2024-10-27 DIAGNOSIS — M25522 Pain in left elbow: Secondary | ICD-10-CM

## 2024-10-27 MED ORDER — BUPIVACAINE HCL 0.25 % IJ SOLN
0.5000 mL | INTRAMUSCULAR | Status: AC | PRN
  Administered 2024-10-27: .5 mL via INTRA_ARTICULAR

## 2024-10-27 MED ORDER — TRIAMCINOLONE ACETONIDE 40 MG/ML IJ SUSP
20.0000 mg | INTRAMUSCULAR | Status: AC | PRN
  Administered 2024-10-27: 20 mg via INTRA_ARTICULAR

## 2024-10-27 MED ORDER — LIDOCAINE HCL 1 % IJ SOLN
5.0000 mL | INTRAMUSCULAR | Status: AC | PRN
  Administered 2024-10-27: 5 mL

## 2024-10-27 NOTE — Progress Notes (Signed)
 Office Visit Note   Patient: Julia Hansen           Date of Birth: 06/24/1986           MRN: [id number] Visit Date: 10/27/2024 Requested by: Alben Therisa MATSU, [place] [address] [place],  [place] [address] PCP: Alben Therisa MATSU, PA  Subjective: Chief Complaint  Patient presents with   Right Foot - Pain   Left Elbow - Pain    HPI: Julia Hansen is a 38 y.o. female who presents to the office reporting right foot pain and left elbow pain.  Patient states that she has had pain around her great toe for the last 2 months.  She denies any injury that she can recall.  She states she has pain with prolonged walking or if she tries to do any strength training or tennis activities or jogging or sprinting.  She has pain at the base of the great toe as well as pain on top of the toe and pain that radiates up the foot.  She denies any mechanical snapping sensation.  Patient also describes left elbow pain in the region of the olecranon.  She denies any history of injury to this area.  Has been ongoing for 3 to 4 months.  Has pain with putting pressure on her left elbow.  She is right-hand dominant.  No numbness or tingling in her foot or her elbow.  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              ROS: All systems reviewed are negative as they relate to the chief complaint within the history of present illness.  Patient denies fevers or chills.  Assessment & Plan: Visit Diagnoses:  1. Pain in left elbow   2. Pain in right foot     Plan: Impression is 38 year old female primarily here for great toe pain.  Has been having increased pain for about 2 months that is keeping her from being active.  She has radiographs taken today demonstrating some very minimal early osteophytic lipping at the first MTP joint.  No loss of motion compared with the  contralateral toe but she may be having some early symptomatic MTP arthritis or may have something else going on in this area such as sesamoiditis versus stress reaction versus plantar plate injury.  After discussion of options, patient would like to try first MTP injection.  This was administered under ultrasound guidance and patient tolerated procedure well without complication.  We will see how this does and she will shoot me a message on MyChart in 1 week and if no improvement, next step would be MRI of the right foot for further evaluation of great toe pain  Follow-Up Instructions: No follow-ups on file.   Orders:  Orders Placed This Encounter  Procedures   XR Elbow 2 Views Left   XR Foot Complete Right   US  Extrem Low Right Ltd   No orders of the defined types were placed in this encounter.     Procedures: Small Joint Inj: R great MTP on 10/27/2024 1:40 PM Indications: pain, joint swelling and diagnostic evaluation Details: 22 G needle, ultrasound-guided medial approach Medications: 5 mL lidocaine  1 %; 0.5 mL bupivacaine  0.25 %; 20 mg triamcinolone  acetonide 40 MG/ML Outcome: tolerated well, no immediate complications Procedure, treatment  alternatives, risks and benefits explained, specific risks discussed. Consent was given by the patient. Immediately prior to procedure a time out was called to verify the correct patient, procedure, equipment, support staff and site/side marked as required. Patient was prepped and draped in the usual sterile fashion.       Clinical Data: No additional findings.  Objective: Vital Signs: There were no vitals taken for this visit.  Physical Exam:  Constitutional: Patient appears well-developed HEENT:  Head: Normocephalic Eyes:EOM are normal Neck: Normal range of motion Cardiovascular: Normal rate Pulmonary/chest: Effort normal Neurologic: Patient is alert Skin: Skin is warm Psychiatric: Patient has normal mood and affect  Ortho Exam:  Ortho exam demonstrates right foot with palpable DP pulse.  Intact ankle dorsiflexion, point flexion, inversion, eversion without any mechanical symptoms.  Intact EHL actively.  Has equivalent passive dorsiflexion and plantarflexion of the great toe relative to the contralateral foot.  She does have pain reproduced with terminal plantarflexion of the great toe she has diffuse tenderness throughout the first MTP joint and the sesamoids of the great toe.  No tenderness elsewhere through the forefoot or midfoot.  No tenderness over the plantar fascia.  Specialty Comments:  No specialty comments available.  Imaging: No results found.   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     Family History  Problem Relation Age of Onset   Hypertension Mother    ADD / ADHD Father    COPD Paternal Grandmother    Cancer Paternal Grandfather     Past Surgical History:  Procedure Laterality Date   COLONOSCOPY     MOLE REMOVAL     MOUTH SURGERY     NASAL ENDOSCOPY     ROTATOR CUFF REPAIR     SHOULDER SURGERY Right    TONSILLECTOMY     WISDOM TOOTH EXTRACTION     Social  History   Occupational History   Occupation: corporate treasurer  Tobacco Use   Smoking status: Never   Smokeless tobacco: Never  Vaping Use   Vaping status: Never Used  Substance and Sexual Activity   Alcohol use: Not Currently   Drug use: No   Sexual activity: Yes    Partners: Male    Birth control/protection: None    Comment: 1st intercourse- 19, partners- 4, married- 3 yrs

## 2024-11-19 DIAGNOSIS — M19079 Primary osteoarthritis, unspecified ankle and foot: Secondary | ICD-10-CM

## 2024-11-19 DIAGNOSIS — M79671 Pain in right foot: Secondary | ICD-10-CM

## 2024-11-22 ENCOUNTER — Encounter: Admitting: Family Medicine

## 2024-11-22 NOTE — Telephone Encounter (Signed)
 That sounds good to me.  Lets do it

## 2024-11-23 NOTE — Telephone Encounter (Signed)
 Order entered

## 2024-11-24 ENCOUNTER — Encounter: Admitting: Family

## 2024-11-26 ENCOUNTER — Encounter: Admitting: Internal Medicine

## 2024-12-12 ENCOUNTER — Ambulatory Visit
Admission: RE | Admit: 2024-12-12 | Discharge: 2024-12-12 | Disposition: A | Source: Ambulatory Visit | Attending: Surgical

## 2024-12-12 DIAGNOSIS — M79671 Pain in right foot: Secondary | ICD-10-CM

## 2024-12-12 DIAGNOSIS — M19079 Primary osteoarthritis, unspecified ankle and foot: Secondary | ICD-10-CM

## 2024-12-23 ENCOUNTER — Telehealth: Payer: Self-pay | Admitting: Surgical

## 2024-12-23 NOTE — Telephone Encounter (Signed)
 Pt called insisting that she have a phone call to go over her MRI.I tried to schedule an apt, but she still insisted that she wanted it done over the phone because Dr. Addie has had phone conversations with her to go over things. Call back number is [phone]. She said she would like to do in person, but doesn't have a sitter for her kids except for Tue after 9:00.

## 2024-12-30 NOTE — Telephone Encounter (Signed)
 Herlene can you call her as you have most recently examined her foot.  Thanks

## 2024-12-31 ENCOUNTER — Ambulatory Visit: Payer: Self-pay | Admitting: Surgical

## 2024-12-31 ENCOUNTER — Telehealth: Payer: Self-pay | Admitting: Surgical

## 2024-12-31 NOTE — Progress Notes (Signed)
 Called patient.  Left voicemail about options given her MRI scan.  She will call back or send MyChart message

## 2024-12-31 NOTE — Telephone Encounter (Signed)
 Pt called saying that she missed Luke's call because her ringer was turned down, and is returning the call. She says that she has questions about what he said about the results of the MRI. Call back number is [phone].

## 2025-01-04 ENCOUNTER — Encounter: Payer: Self-pay | Admitting: Orthopedic Surgery

## 2025-01-04 DIAGNOSIS — M79671 Pain in right foot: Secondary | ICD-10-CM

## 2025-01-04 DIAGNOSIS — M19079 Primary osteoarthritis, unspecified ankle and foot: Secondary | ICD-10-CM

## 2025-01-04 NOTE — Telephone Encounter (Signed)
 Hi Lauren, can you put referral in for [facility] to see Dr. Kit?

## 2025-01-05 NOTE — Telephone Encounter (Signed)
I responded to patient's MyChart message.

## 2025-01-05 NOTE — Telephone Encounter (Signed)
 Referral placed
